# Patient Record
Sex: Female | Born: 1950 | Race: White | Hispanic: No | Marital: Married | Smoking: Former smoker
Health system: Southern US, Community
[De-identification: ages and names within clinical notes are randomized; demographics above are authoritative.]

## PROBLEM LIST (undated history)

## (undated) DIAGNOSIS — N951 Menopausal and female climacteric states: Secondary | ICD-10-CM

## (undated) DIAGNOSIS — I639 Cerebral infarction, unspecified: Secondary | ICD-10-CM

## (undated) DIAGNOSIS — H409 Unspecified glaucoma: Secondary | ICD-10-CM

## (undated) DIAGNOSIS — M797 Fibromyalgia: Secondary | ICD-10-CM

## (undated) DIAGNOSIS — F32A Depression, unspecified: Secondary | ICD-10-CM

## (undated) DIAGNOSIS — F329 Major depressive disorder, single episode, unspecified: Secondary | ICD-10-CM

## (undated) DIAGNOSIS — M1990 Unspecified osteoarthritis, unspecified site: Secondary | ICD-10-CM

## (undated) DIAGNOSIS — E669 Obesity, unspecified: Secondary | ICD-10-CM

## (undated) DIAGNOSIS — E66811 Obesity, class 1: Secondary | ICD-10-CM

## (undated) DIAGNOSIS — F319 Bipolar disorder, unspecified: Secondary | ICD-10-CM

## (undated) DIAGNOSIS — F419 Anxiety disorder, unspecified: Secondary | ICD-10-CM

## (undated) DIAGNOSIS — J189 Pneumonia, unspecified organism: Secondary | ICD-10-CM

## (undated) HISTORY — DX: Major depressive disorder, single episode, unspecified: F32.9

## (undated) HISTORY — DX: Fibromyalgia: M79.7

## (undated) HISTORY — DX: Bipolar disorder, unspecified: F31.9

## (undated) HISTORY — DX: Obesity, unspecified: E66.9

## (undated) HISTORY — DX: Depression, unspecified: F32.A

## (undated) HISTORY — DX: Pneumonia, unspecified organism: J18.9

## (undated) HISTORY — PX: BREAST SURGERY: SHX581

## (undated) HISTORY — PX: APPENDECTOMY: SHX54

## (undated) HISTORY — DX: Menopausal and female climacteric states: N95.1

## (undated) HISTORY — DX: Unspecified osteoarthritis, unspecified site: M19.90

## (undated) HISTORY — DX: Unspecified glaucoma: H40.9

## (undated) HISTORY — DX: Obesity, class 1: E66.811

## (undated) HISTORY — PX: SPINE SURGERY: SHX786

## (undated) HISTORY — DX: Anxiety disorder, unspecified: F41.9

---

## 1999-12-20 ENCOUNTER — Other Ambulatory Visit: Admission: RE | Admit: 1999-12-20 | Discharge: 1999-12-20 | Payer: Self-pay | Admitting: Family Medicine

## 2000-04-28 ENCOUNTER — Encounter: Admission: RE | Admit: 2000-04-28 | Discharge: 2000-04-28 | Payer: Self-pay | Admitting: Family Medicine

## 2000-04-28 ENCOUNTER — Encounter: Payer: Self-pay | Admitting: Family Medicine

## 2003-09-18 ENCOUNTER — Encounter
Admission: RE | Admit: 2003-09-18 | Discharge: 2003-12-17 | Payer: Self-pay | Admitting: Physical Medicine and Rehabilitation

## 2003-11-05 ENCOUNTER — Ambulatory Visit (HOSPITAL_COMMUNITY)
Admission: RE | Admit: 2003-11-05 | Discharge: 2003-11-05 | Payer: Self-pay | Admitting: Physical Medicine and Rehabilitation

## 2003-12-08 ENCOUNTER — Ambulatory Visit (HOSPITAL_COMMUNITY): Admission: RE | Admit: 2003-12-08 | Discharge: 2003-12-08 | Payer: Self-pay | Admitting: Specialist

## 2003-12-08 ENCOUNTER — Ambulatory Visit (HOSPITAL_BASED_OUTPATIENT_CLINIC_OR_DEPARTMENT_OTHER): Admission: RE | Admit: 2003-12-08 | Discharge: 2003-12-08 | Payer: Self-pay | Admitting: Specialist

## 2003-12-08 ENCOUNTER — Encounter (INDEPENDENT_AMBULATORY_CARE_PROVIDER_SITE_OTHER): Payer: Self-pay | Admitting: Specialist

## 2003-12-24 ENCOUNTER — Encounter
Admission: RE | Admit: 2003-12-24 | Discharge: 2004-03-23 | Payer: Self-pay | Admitting: Physical Medicine and Rehabilitation

## 2004-02-10 ENCOUNTER — Other Ambulatory Visit: Admission: RE | Admit: 2004-02-10 | Discharge: 2004-02-10 | Payer: Self-pay | Admitting: Internal Medicine

## 2004-04-06 ENCOUNTER — Encounter
Admission: RE | Admit: 2004-04-06 | Discharge: 2004-06-16 | Payer: Self-pay | Admitting: Physical Medicine and Rehabilitation

## 2004-06-16 ENCOUNTER — Encounter
Admission: RE | Admit: 2004-06-16 | Discharge: 2004-08-13 | Payer: Self-pay | Admitting: Physical Medicine and Rehabilitation

## 2004-06-18 ENCOUNTER — Ambulatory Visit: Payer: Self-pay | Admitting: Physical Medicine and Rehabilitation

## 2004-08-13 ENCOUNTER — Encounter
Admission: RE | Admit: 2004-08-13 | Discharge: 2004-10-27 | Payer: Self-pay | Admitting: Physical Medicine and Rehabilitation

## 2004-09-15 ENCOUNTER — Ambulatory Visit: Payer: Self-pay | Admitting: Physical Medicine and Rehabilitation

## 2004-10-27 ENCOUNTER — Encounter
Admission: RE | Admit: 2004-10-27 | Discharge: 2005-01-25 | Payer: Self-pay | Admitting: Physical Medicine and Rehabilitation

## 2004-10-29 ENCOUNTER — Ambulatory Visit: Payer: Self-pay | Admitting: Physical Medicine and Rehabilitation

## 2004-12-24 ENCOUNTER — Emergency Department: Payer: Self-pay | Admitting: Emergency Medicine

## 2005-01-07 ENCOUNTER — Ambulatory Visit: Payer: Self-pay | Admitting: Physical Medicine and Rehabilitation

## 2005-02-02 ENCOUNTER — Encounter
Admission: RE | Admit: 2005-02-02 | Discharge: 2005-05-03 | Payer: Self-pay | Admitting: Physical Medicine and Rehabilitation

## 2005-02-23 ENCOUNTER — Ambulatory Visit: Payer: Self-pay | Admitting: Physical Medicine and Rehabilitation

## 2005-04-26 ENCOUNTER — Ambulatory Visit: Payer: Self-pay | Admitting: Physical Medicine and Rehabilitation

## 2005-05-26 ENCOUNTER — Encounter
Admission: RE | Admit: 2005-05-26 | Discharge: 2005-08-24 | Payer: Self-pay | Admitting: Physical Medicine and Rehabilitation

## 2005-06-23 ENCOUNTER — Ambulatory Visit: Payer: Self-pay | Admitting: Physical Medicine and Rehabilitation

## 2005-08-18 ENCOUNTER — Ambulatory Visit: Payer: Self-pay | Admitting: Physical Medicine and Rehabilitation

## 2005-09-15 ENCOUNTER — Encounter
Admission: RE | Admit: 2005-09-15 | Discharge: 2005-12-14 | Payer: Self-pay | Admitting: Physical Medicine and Rehabilitation

## 2005-10-12 ENCOUNTER — Ambulatory Visit: Payer: Self-pay | Admitting: Physical Medicine and Rehabilitation

## 2005-12-09 ENCOUNTER — Ambulatory Visit: Payer: Self-pay | Admitting: Physical Medicine and Rehabilitation

## 2006-01-06 ENCOUNTER — Ambulatory Visit: Payer: Self-pay | Admitting: Physical Medicine & Rehabilitation

## 2006-01-06 ENCOUNTER — Encounter
Admission: RE | Admit: 2006-01-06 | Discharge: 2006-04-06 | Payer: Self-pay | Admitting: Physical Medicine and Rehabilitation

## 2006-02-03 ENCOUNTER — Ambulatory Visit: Payer: Self-pay | Admitting: Physical Medicine and Rehabilitation

## 2006-03-02 ENCOUNTER — Ambulatory Visit: Payer: Self-pay | Admitting: Physical Medicine and Rehabilitation

## 2006-03-27 ENCOUNTER — Ambulatory Visit: Payer: Self-pay | Admitting: Physical Medicine and Rehabilitation

## 2006-04-26 ENCOUNTER — Encounter
Admission: RE | Admit: 2006-04-26 | Discharge: 2006-07-25 | Payer: Self-pay | Admitting: Physical Medicine and Rehabilitation

## 2006-05-25 ENCOUNTER — Ambulatory Visit: Payer: Self-pay | Admitting: Physical Medicine and Rehabilitation

## 2006-06-27 ENCOUNTER — Ambulatory Visit: Payer: Self-pay | Admitting: Physical Medicine and Rehabilitation

## 2006-07-25 ENCOUNTER — Encounter
Admission: RE | Admit: 2006-07-25 | Discharge: 2006-10-23 | Payer: Self-pay | Admitting: Physical Medicine and Rehabilitation

## 2006-07-25 ENCOUNTER — Ambulatory Visit: Payer: Self-pay | Admitting: Physical Medicine and Rehabilitation

## 2006-09-20 ENCOUNTER — Ambulatory Visit: Payer: Self-pay | Admitting: Physical Medicine and Rehabilitation

## 2006-10-20 LAB — HM COLONOSCOPY: HM Colonoscopy: NORMAL

## 2006-11-09 ENCOUNTER — Ambulatory Visit: Payer: Self-pay | Admitting: Internal Medicine

## 2006-11-16 ENCOUNTER — Encounter
Admission: RE | Admit: 2006-11-16 | Discharge: 2007-02-14 | Payer: Self-pay | Admitting: Physical Medicine and Rehabilitation

## 2006-11-16 ENCOUNTER — Ambulatory Visit: Payer: Self-pay | Admitting: Physical Medicine and Rehabilitation

## 2006-11-24 ENCOUNTER — Ambulatory Visit: Payer: Self-pay | Admitting: Internal Medicine

## 2006-12-14 ENCOUNTER — Ambulatory Visit: Payer: Self-pay | Admitting: Physical Medicine and Rehabilitation

## 2007-02-06 ENCOUNTER — Ambulatory Visit: Payer: Self-pay | Admitting: Physical Medicine and Rehabilitation

## 2007-03-05 ENCOUNTER — Ambulatory Visit: Payer: Self-pay | Admitting: Gastroenterology

## 2007-03-07 ENCOUNTER — Encounter
Admission: RE | Admit: 2007-03-07 | Discharge: 2007-06-05 | Payer: Self-pay | Admitting: Physical Medicine and Rehabilitation

## 2007-05-02 ENCOUNTER — Ambulatory Visit: Payer: Self-pay | Admitting: Physical Medicine and Rehabilitation

## 2007-06-27 ENCOUNTER — Ambulatory Visit: Payer: Self-pay | Admitting: Physical Medicine and Rehabilitation

## 2007-06-27 ENCOUNTER — Encounter
Admission: RE | Admit: 2007-06-27 | Discharge: 2007-06-27 | Payer: Self-pay | Admitting: Physical Medicine and Rehabilitation

## 2007-08-16 ENCOUNTER — Ambulatory Visit: Payer: Self-pay | Admitting: Physical Medicine and Rehabilitation

## 2007-09-07 ENCOUNTER — Ambulatory Visit: Payer: Self-pay | Admitting: Physical Medicine and Rehabilitation

## 2007-09-07 ENCOUNTER — Encounter
Admission: RE | Admit: 2007-09-07 | Discharge: 2007-12-06 | Payer: Self-pay | Admitting: Physical Medicine and Rehabilitation

## 2007-11-08 ENCOUNTER — Encounter
Admission: RE | Admit: 2007-11-08 | Discharge: 2008-02-06 | Payer: Self-pay | Admitting: Physical Medicine and Rehabilitation

## 2007-11-08 ENCOUNTER — Ambulatory Visit: Payer: Self-pay | Admitting: Physical Medicine and Rehabilitation

## 2007-12-26 ENCOUNTER — Ambulatory Visit: Payer: Self-pay | Admitting: Physical Medicine and Rehabilitation

## 2008-01-24 ENCOUNTER — Encounter
Admission: RE | Admit: 2008-01-24 | Discharge: 2008-04-23 | Payer: Self-pay | Admitting: Physical Medicine and Rehabilitation

## 2008-01-30 ENCOUNTER — Ambulatory Visit: Payer: Self-pay | Admitting: Physical Medicine and Rehabilitation

## 2008-02-28 ENCOUNTER — Ambulatory Visit: Payer: Self-pay | Admitting: Physical Medicine and Rehabilitation

## 2008-03-27 ENCOUNTER — Ambulatory Visit: Payer: Self-pay | Admitting: Physical Medicine and Rehabilitation

## 2008-04-25 ENCOUNTER — Encounter
Admission: RE | Admit: 2008-04-25 | Discharge: 2008-06-27 | Payer: Self-pay | Admitting: Physical Medicine and Rehabilitation

## 2008-04-28 ENCOUNTER — Ambulatory Visit: Payer: Self-pay | Admitting: Physical Medicine and Rehabilitation

## 2008-05-27 ENCOUNTER — Ambulatory Visit: Payer: Self-pay | Admitting: Physical Medicine and Rehabilitation

## 2008-06-27 ENCOUNTER — Ambulatory Visit: Payer: Self-pay | Admitting: Physical Medicine and Rehabilitation

## 2008-07-18 ENCOUNTER — Encounter
Admission: RE | Admit: 2008-07-18 | Discharge: 2008-07-25 | Payer: Self-pay | Admitting: Physical Medicine and Rehabilitation

## 2008-07-25 ENCOUNTER — Ambulatory Visit: Payer: Self-pay | Admitting: Physical Medicine and Rehabilitation

## 2011-01-19 ENCOUNTER — Emergency Department: Payer: Self-pay | Admitting: Emergency Medicine

## 2011-03-01 NOTE — Assessment & Plan Note (Signed)
Catherine Hughes is a 60 year old married female who is being followed in our  pain and rehabilitative clinic predominantly for low back pain.  She is  status post lumbar fusion in 1990s, has intermittent left leg pain.  She  is back in today.   She states her average pain is between at 6-9 on a scale of 10.  The  pain is described as aching, stabbing, sharp, burning in nature,  variably interfering with her general activity.  The pain is typically  worse in the morning and in the evening, worse with activity such as  bending and inactivity.  She improves with rest, heat, therapy,  medications.  She gets better relief with current meds that she is being  treated with.   MEDICATIONS:  Provided by our clinic, include:  1. Flexeril 5 mg, 3 times a day.  2. Lidoderm on a p.r.n. basis.  3. Celebrex 200 mg once a day, not more than 10 per month.  4. Prilosec 20 mg daily.  5. Vicodin 5/500, up to 4 times a day.   She states that she has been having some vaginal bleeding and is  currently being followed up by her gynecologist, is going to undergo an  ultrasound.   Regarding her functional status, she is able to walk between 10-40  minutes at a time.  She is able to climb stairs.  Occasionally she does  have some trouble.  She does not drive.  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  She is independent with her  self care, needs some assistance with higher level household activities.  She admits to depression/anxiety.  She denies suicidal ideation.   PAST MEDICAL HISTORY:  Otherwise unchanged.   SOCIAL HISTORY:  Otherwise unchanged.   FAMILY HISTORY:  Otherwise unchanged.   PHYSICAL EXAMINATION:  VITAL SIGNS:  Blood pressure 130/78, pulse 115,  respirations 18, 98% saturated on room air.  GENERAL:  She is a well developed, mildly obese female who appears her  stated age and does not appear in any distress.  NEUROLOGIC:  She is oriented x3.  Her speech is  clear.  Affect is  bright.  She is alert, cooperative, and pleasant.  She follows commands  without difficulty.  MUSCULOSKELETAL:  Transitioning from sit-to-stand is done with ease.  Gait in the room is normal.  Tandem gait and Romberg test are performed  adequately.  Limitations are noted in lumbar motion in all planes.  Reflexes are symmetric and intact.  Motor strength is in the 5/5 range.  Straight leg raise is negative.   IMPRESSION:  1. Chronic low back pain, status post lumbar fusion in 1995.  2. History of bipolar disorder, currently on Seroquel.  3. Anxiety, currently on Xanax.  The Seroquel and Xanax are provided      by outside physician.  4. I will refill the following medications for her today:      a.     Celebrex 200 mg, one p.o. daily p.r.n. #20, for low back       pain.      b.     Vicodin 5/500, one p.o. q.i.d. p.r.n. back pain, #120.      c.     Robaxin 500 mg, one p.o. daily, #30, one refill.  Not to be       taken with Flexeril.  5. She has found that Flexeril may not be that helpful.  She would  like to trial another muscle relaxant.  We will trial her on      Robaxin.  6. I will see her back in 2 months.  7. Nursing visit next month for refill of her medications.           ______________________________  Brantley Stage, M.D.     DMK/MedQ  D:  09/10/2007 14:27:15  T:  09/10/2007 16:38:09  Job #:  [id number]

## 2011-03-01 NOTE — Assessment & Plan Note (Signed)
Catherine Hughes is a 60 year old married woman who is followed in our Pain  and Rehabilitative Clinic for chronic low back pain.  She is back in  today for a refill of her pain medications.   She states her average pain is about a 3 on a scale of 10 for the most  part.  Currently, it is about a 6.  However, she has been working out in  the yard quite a bit over the last couple of days, and she had family  members over for the [employer].  She does also have some  complaints of some cervicalgia, which is resolving for her.   Average pain moderately interferes with activity.  Sleep is fair.  She  gets fair relief with the current medications that she is on.  Her low  back pain is typically aggravated by bending, walking and sitting.   FUNCTIONAL STATUS:  She is able to walk about 45 minutes at a time.  She  is able to climb stairs.  She is currently not driving.  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  She also is currently  starting to paint again, enjoy her art work as well as gardening, which  is an overall improvement in her general function.  Feeding, dressing, bathing and toileting are done independently.  She  requires occasional assistance with meal prep, household duties and  shopping.   She denies problems controlling bowel or bladder, admits to occasional  depression , confusion, anxiety, but reports overall these things are  much better.  She denies suicidal ideation.   There are no changes in her past medical, social or family history since  our last visit.   MEDICATIONS FROM THIS CLINIC:  1. Flexeril 5 mg up to 3 times a day on a p.r.n. basis.  2. Lidoderm p.r.n.  3. Prilosec daily.  4. Celebrex 200 mg not more than 20 times per month.  5. Vicodin 5/500 up to 4 times a day on a p.r.n. basis for low back      pain.   EXAMINATION:  Blood pressure is 124/66, pulse 90, respiration 18, 96%  saturated on room air.  She is an obese woman who  appears her stated  age, and does not appear in any distress.  She is oriented x3.  Speech  is clear.  Her affect is bright.  She is alert, cooperative and  pleasant.  She follows commands without difficulty.  Transitioning from sitting to standing is done with ease.  Gait in the  room is normal, nonantalgic.  Tandem gain, Romberg's test are all  performed adequately.  She has limitations in lumbar motion in all planes.  Reflexes are symmetric and intact in lower extremities.  Her motor  strength is 5/5 at hip flexors, knee extensors, dorsiflexors, plantar  flexors without focal deficit noted.  Sensory exam is not tested today.  Straight leg raise is negative.  No abnormal tone is noted.  No clonus  is noted.   IMPRESSION:  1. Chronic low back pain status post lumbar fusion 1995.  2. Lumbar spondylosis.  3. History of bipolar disorder currently being managed by Dr.      Annabell Howells, and being treated with multiple psychiatric mediations      including Xanax, Restoril, Neurontin, Seroquel, Lamictal and      recently added was Prozac.   We will refill her hydrocodone today 5/500 one p.o. up to 4 times a day  #  120 per month.  She does not need refills on any of the other  mediations at this time.  We will see her back in two months.  Nursing  visit next month for refill.   [name] has been stable on the above mediations.  She has not  exhibited any aberrant behavior.  She takes her medications as  prescribed, and her functional status has overall been improving over  the last year.  She is now engaging in some avocational activities,  which she is enjoying including painting and gardening.           ______________________________  Brantley Stage, M.D.     DMK/MedQ  D:  01/30/2008 13:35:36  T:  01/30/2008 14:09:41  Job #:  [id number]

## 2011-03-01 NOTE — Assessment & Plan Note (Signed)
Catherine Hughes is a 60 year old married female who was seen last by me on  Mar 08, 2007.  She is back in today for a refill of her medications.  She is being seen in our pain and rehab clinic for chronic low-back  pain.  She is status post a fusion from 47.  She also had some recent  right Achilles tendonitis which was bothering her at the last visit.   She is back in today, reports overall improvement in the right Achilles  tendon, just mildly bothering her at this point.  Average pain in the  low-back is about a 7 to a 10 on the pain scale.   She has been quite active.  She has been working in the yard and doing  some planting, using an shovel and overall has been quite active.   She reports sleep poor at times.  Pain is typically worse with bending  and sitting, certain activities, and even inactivity bothers her.  Improves with heat, ice, physical therapy and medications.  She is  getting good relief with the current medications that she is on.  She  can walk for over a half of an hour.  She is able to climb stairs.  She  does not drive.  She is independent with her self care and needs some  assistance with meal prep, household duties and shopping.  She admits to  occasional depression/anxiety but denies suicidal ideation.   She states that she recently underwent colonoscopy.  The polyp was  removed, which was not malignant.   No other changes in past medical, social or family history since last  visit.   Medications provided by this clinic include:  1. Flexeril 5 mg up to three times a day.  2. Lidoderm on a p.r.n. basis.  3. Fentanyl patch 12 mcg patch every 3 days.  4. Celebrex 200 mg once a day up to 10 tablets per month.  5. Prilosec 20 mg daily.  6. Vicodin 5/500 one p.o. daily to b.i.d. #45 per month.   EXAMINATION TODAY:  Blood pressure is 122/78, pulse 78, respirations 16,  100% saturated on room air.  She is mildly obese white female who  appears her stated age and  does not appear in any distress.  She is  oriented x3.  Her affect is bright, alert, cooperative and pleasant.  Her speech is clear.  She follows commands without any difficulty.   She transitions from sitting to standing without any problems.  Her gait  is within normal limits.  Tandem gait and Romberg's test are performed  and are adequately performed.   She has limitations in lumbar motion in all pains.  Seated reflexes are  symmetric and intact.  Motor strength is good in the lower extremities.  No sensory deficits are appreciated.  Straight leg raise negative.   Achilles tendon was examined in the right and left side.  No tenderness  is noted today.  She appears to have had significant improvement in this  over the last month.   IMPRESSION:  1. Resolution of Achilles tendonitis.  2. Chronic low-back pain status post fusion 1995.  3. History of substance abuse.  4. History of bipolar disorder.  5. History of anxiety/depression.   PLAN:  Refilled the following medications for her:  1. Flexeril 5 mg one p.o. t.i.d. p.r.n. back spasm #90 with three      refills.  2. Fentanyl 12 mcg patch one patch q.72h. hours #10, no  refills.  3. Vicodin 5/500 one p.o. daily b.i.d. p.r.n. back pain, #45.   Will also give her a prescription for physical therapy to provide  massage and/or modalities to decrease her pain and avoid further  narcotic analgesia.  Diagnosis would be previous disc surgery, lumbago,  degenerative disc disorder, and lumbar muscle spasm which is noted on  exam as well.  We will see her back in a month.           ______________________________  Brantley Stage, M.D.     DMK/MedQ  D:  03/08/2007 14:38:29  T:  03/08/2007 15:54:42  Job #:  [id number]

## 2011-03-01 NOTE — Assessment & Plan Note (Signed)
Catherine Hughes is a 60 year old married woman who is being seen in our Pain  and Rehabilitative Clinic __________ for chronic low back pain, status  post effusion back in the 1990s with intermittent left leg pains.   She was out of state visiting her sick mother.   She was walking in the hospital and apparently the floor was wet.  She  states that she came around the corner and slipped on a wet floor and  fell backwards, hit her head.   She states that she underwent diagnostic testing out of state including  x-rays, CT scan of her head which was okay other than some arthritis in  her neck.   Average pain scores are between a 2 and a 5 and on the average between a  5 and a 10.  Pain is located apparently in the posterior cervical region  and low back, radiating down the left leg to about the lateral thigh,  stopping at the knees.   __________ before, she gets very good relief with the current  medications prescribed by this clinic.   Medications from this clinic include:  1. Flexeril 5 mg up to 3 times a day on a p.r.n. basis.  2. Lidoderm p.r.n.  3. Fentanyl 12 mcg patch 1 q. 72 hours.  4. Vicodin 5/500 up to twice a day for a total of not more than 45      tablets per month.   She never climbs stairs.  She is not driving.  She does some computer  work at home.  Is independent with self care.  She denies suicidal  ideations, she denies problem controlling bowel or bladder.   No health and history form. Review of systems is attached to the chart.   No other changes in her past medical, social or family history  otherwise.   EXAM:  Blood pressure is 117/72, pulse 84, respirations 17, 99%  saturation on room air.  She is a well-developed, well-nourished female who appears her stated  age.   She is oriented x3.  Her speech is clear.  Her affect is bright, alert,  cooperative, pleasant.  She does get a little tearful discussing her  mother's illness however.  She follows commands  without difficulty.   Transitioning from sitting to standing is without any problems.  Gait in  the room is normal.  Tandem gait, Romberg's test performed adequately.  She has good range of motion in her cervical spine, including rotation  right, left, forward flexion and extension as well as lateral flexion.  She reports some discomfort in the right upper trapezius area with  rotation to the right.   She has some tenderness to palpation in the bilateral upper trapezius  musculature and pericapsular musculature.   Low back is just mildly limited in forward flexion and extension.  Forward flexion bothers her a little more than extension.  Reflexes are  evaluated in the upper and lower extremities.  They are symmetric and  intact.  The biceps, triceps, brachial radialis, patellar and Achilles'  tendon .  There is no clonus noted, no abnormal tone is noted. Her motor  strength is good in upper and lower extremities without focal weakness.  No sensory abnormalities are appreciated with light touch today.   IMPRESSION:  1. Chronic low back pain status post lumbar fusion 1995.  2. Recent injury to neck and head after slipping and falling.  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   PLAN:  Will refill her medications today to include fentanyl 12 mcg  patch per hour, 1 patch q. 72 hours, #10.  Vicodin 5/500 one p.o. q. day  to b.i.d. p.r.n. back pain, #45.  We will get her involved in a physical  therapy program, modalities to the lumbar area and a lumbar  stabilization program, transitioning to home program.   Ms. Laforte has expressed interest again today in discontinuing her  fentanyl.  She would like to do this within the next couple of months.  During the transition phase, was considering calling in Vicodin weekly  for her.   She so far has been quite stable on  these medications, has exhibited no  aberrant behavior.  She takes her medicines as prescribed and is getting  very good relief with them and is able to maintain a very functional  lifestyle.           ______________________________  Brantley Stage, M.D.     DMK/MedQ  D:  05/03/2007 09:12:26  T:  05/03/2007 15:06:41  Job #:  [id number]

## 2011-03-01 NOTE — Assessment & Plan Note (Signed)
HISTORY OF PRESENT ILLNESS:  [name] is a 60 year old married white  female who is followed in our Pain and Rehabilitative Clinic for a  chronic low back pain.  She was last seen by me on January 30, 2008.  She  has had nursing visit on Feb 28, 2008 and March 27, 2008.   She is back in today and indicates that her average pain is variable  between a 3 and 7 on a scale of 10.  Pain is intermittent, worse with  certain activities, walking, bending, and sitting for a prolonged period  of time, even inactivity bothers her.  However, her low back for the  most part has been doing fairly well and only moderately affecting her  general activity.   She states that she has done active gardening and been doing some  weeding and she got an athletic machine from a friend or relative,  whereby she engages in a motion similar to cross-country skiing.  She  states that over the last couple of months, she has developed some knee  pain, which does not occur everyday, rather occurring up to 10 days or  so a month, lasting maybe 5-10 minutes when it does occur.  She has some  concerns regarding this and what she could do about it.   She denies any trauma to the knees. Both knees bother her, sometimes  more one-sided than the other.  Not any particular site is continually  involved.  She is getting fair relief with current meds prescribed from  this clinic.   MEDICATIONS:  Medications that she does take from this clinic include:  1. Flexeril 5 mg up to 3 times a day p.r.n.  2. Lidoderm p.r.n.  3. Prilosec daily.  4. Celebrex 200 and not more than 20 times per month.  5. Vicodin 5/500 up to 4 times a day p.r.n. for low back pain.   FUNCTIONAL STATUS:  Mobility, she is able to walk 15-20 minutes at a  time, up to 2-3 times a week.  She is able to climb stairs.  She is able  to drive.  She is a high functioning individual, currently engaged in  gardening and independent with all of her self care,  including some  higher level household activities.  She does avoid heavier lifting type  activities at home, however.   REVIEW OF SYSTEMS:  No new changes with respect to review of systems.  Reports occasional bladder control problems with coughing or sneezing,  which is not new.  No new numbness and no new tingling, weakness, or  dizziness.  Occasional confusion, anxiety, and denies depression or  suicidal ideations.   She is currently followed by Dr. Dossie Arbour, who is managing her anxiety  and history of bipolar disorder.   She admits to occasional constipation, which is controlled.   No changes in the past medical, social, or family history since her last  visit 2 months ago.   PHYSICAL EXAMINATION:  VITAL SIGNS:  ON exam, blood pressure is 110/75,  pulse 70, respirations 18, and 97% saturated on room air.  GENERAL:  She is well developed, well nourished, mildly obese female who  does not appear in any distress.   She is oriented x3 and her speech is clear.  Her affect is bright.  She  is alert, cooperative, and pleasant.  She follows commands without  difficulty.   NEURO:  Cranial nerves are grossly intact.  Coordination is grossly  intact.  She  transitions evenly from sitting to standing.  Her gait is  in the room is normal, tandem gait.  Romberg test, heel and toe walking  are also performed adequately.  Reflexes are 1+ at the patellar and  Achilles tendons and there is no abnormal tone or clonus noted.  Sensation is intact in the lower extremities as well.   MUSCULOSKELETAL EXAM:  Reveals some flattening of the lumbar lordosis.  She has the limitations in lumbar flexion.  She is able to touch the  floor however, because she has a very good hip range of motion.   Some tenderness is noted in the lumbar paraspinal musculature.   Examination of the right knee reveals no swelling.  She has full range  of motion.  No ligamentous instability is appreciated and no tenderness   to palpation in any of the soft tissue structures about the right knee  or along the medial joint line.  She states she really does not have any  pain in the right knee at this time.  Left knee again reveals a full  range of motion with flexion as well as extension.  No evidence of  effusion.  She has intact AP and medial and lateral ligaments.  There is  no tenderness at the inferior or superior patella.  Joint line is  essentially nontender.  She does have some tenderness in the left medial  pes anserine region, which is actually quite tender today.   IMPRESSION:  1. New problems, intermittent bilateral knee pain.  Exam consistent      with left pes anserine tendinitis at this time.  Right knee is      essentially nontender.  2. Chronic low back pain, status post lumbar fusion, 1995.  3. Lumbar spondylosis.  4. History of bipolar disorder. It is currently being managed by Dr.      Dossie Arbour and he has her on Prozac, Neurontin, Seroquel, and      Lamictal.   PLAN:  [name] would like to attempt to reduce the amount of Vicodin  she is taking.  We will write her prescription today.  Vicodin 5/500,  1/2 tablet at 1 p.o. q.i.d. p.r.n. back pain #120.  We will have her  setup for a nursing visit next month for a refill of her pain  medications; however, she may not need the visit for refill and I will  see her back in 2 months.  [name] has been taking her medications as  prescribed.  She did forget her pill bottles this morning; however, she  states that she has 1 tablet left today at home.  She will try to  remember her pill bottles and this was discussed by nursing staff as  well as myself today.  Overall, she has not displayed over the several  months any aberrant behavior with her medication use, in fact, she is  requesting an overall reduction in her narcotics.  I will see her back  in 2 months, a tentative nursing visit next month.           ______________________________   Brantley Stage, M.D.     DMK/MedQ  D:  04/28/2008 11:23:03  T:  04/29/2008 07:35:26  Job #:  [id number]

## 2011-03-01 NOTE — Assessment & Plan Note (Signed)
The patient is a 60 year old married female who is being seen in our  pain and rehabilitation clinic for chronic low back pain.  She is back  in today and has complaints of low back pain.  The pain is variable from  2 to 8 on a scale of 10.  In the clinic today it is about 6 to 8 on a  scale of 10.  Pain is described as intermittent, dull, sharp, stabbing,  aching in nature, moderately interfering with general activity, worse  during the daytime and evening.  It does not bother her as much at night  and in the morning.  Worse with bending and activity, standing.  Improves with rest, medications, heat.  She gets fair relief with  current medicines that are prescribed by this clinic.   MEDICATIONS FROM THIS CLINIC:  1. Flexeril 5 mg up to three times a day.  2. Lidoderm p.r.n.  3. Prilosec 20 mg daily.  4. Vicodin 5/500 one p.o. q.i.d. p.r.n. #120 per month.  5. Celebrex 200 mg not more than 20 times per month.   She reports no new medical problems in the interim.  She has been going  through menopause and is on some hormones.  She states that she is  having some lower abdominal cramps today but no bleeding.   She is able to walk about an hour at a time, able to climb steps.  She  is independent with feeding, bathing, toileting, and dressing.  She  requires some assistance with higher level household activities.   She admits to intermittent numbness and tingling in the lower  extremities.  Admits to depression, confusion, and anxiety.  Denies  suicidal ideation.   REVIEW OF SYSTEMS:  Otherwise noncontributory.   PAST MEDICAL HISTORY:   SOCIAL HISTORY:   FAMILY HISTORY:  Unchanged from last month.  She was seen last here on  November 09, 2007.   PHYSICAL EXAMINATION:  VITAL SIGNS:  Blood pressure 111/82, pulse 92,  respirations 18, 98% saturated on room air.  GENERAL:  She is mildly obese white female who does not appear in any  distress.  She is oriented x3.  Her speech is  clear and affect is  bright.  She is alert, cooperative, and pleasant.  She follows commands  without any difficulty.  NEUROLOGY:  Transitioning from sitting to standing is done with ease.  Gait in the room is normal.  Tandem gait, Romberg test, heel-toe walking  are all performed adequately.  Limitations are noted in lumbar extension  not more than about 5-10 degrees of lumbar extension are noted today.  Forward flexion is quite good, approximately 80 degrees, but does note  some increased back pain upon extension back up.  Mild tenderness in the  paraspinal muscles are noted.  Reflexes are symmetric in the lower  extremities.  No abnormal tone is noted.  No clonus is noted.  Motor  strength is 5/5 at hip flexors, knee extensors, dorsiflexors, plantar  flexors, EHL.  Straight leg raise is negative.  No focal sensory  deficits are appreciated.   IMPRESSION:  1. Chronic low back pain status post lumbar fusion in 1995.  2. Lumbar spondylosis.  3. History of bipolar disorder, currently being treated with multiple      psychiatric medications including Xanax, Restoril, Neurontin,      Seroquel, Lamictal prescribed by Dr. Annabell Howells.   PLAN:  I will refill her Celebrex 200 mg not more than 10 per month with  three refills, Flexeril 5 mg one p.o. t.i.d. #90 with three refills,  Prilosec 20 mg one p.o. daily #30 with three refills, Vicodin 5/500 one  p.o. q.i.d. p.r.n. back pain #120 with no refills.  We will have the  nursing staff see her back in a month for refill of her medications.  We  will just need to refill the Vicodin for her at that time.  I will see  her back in two months.           ______________________________  Brantley Stage, M.D.     DMK/MedQ  D:  12/07/2007 14:23:08  T:  12/08/2007 11:30:46  Job #:  [id number]

## 2011-03-01 NOTE — Assessment & Plan Note (Signed)
HISTORY:  The patient is a 61 year old married female who is being seen  in our pain and rehabilitative clinic for chronic low back pain.  She is  status post a lumbar fusion in the 1990's with intermittent left leg  pain.   She is back in today and is requesting refill of her medications.  She  states she has been off of her fentanyl patch now for 4 days.  She put  one patch on Saturday, took it off Sunday after the second day and she  has not used any fentanyl patches now for 4 days.  She states she is  doing fairly well, using the Vicodin 5/500 in place up to four times a  day as well as Lidoderm.  Her pain has slightly worsened; however, she  believes she is tolerating it well and is able to maintain her function.  She has obtained a new psychiatrist over the last couple of months, Dr.  Annabell Howells, and has been started on Seroquel.  She states that she feels  she is doing much better with this medication.   Pain is typically worsened with inactivity and not pacing her  activities.  Improves with heat, ice, medication.  She is getting fair  relief currently with the medications provided by this clinic.   MEDICATIONS FROM THIS CLINIC:  1. Flexeril 5 mg one p.o. t.i.d.  2. Lidoderm 5% on a p.r.n. basis.  3. Celebrex 200 mg p.r.n.  4. Prilosec 20 mg q.d.  5. Vicodin 5/500 one p.o. q.i.d. p.r.n. back pain.   She is a high functioning woman who is independent with all of her self-  care and helps out around the house as well as the yard.  She denies  suicidal ideation.  Denies problems controlling bowel or bladder.  Does  admit to some anxiety.  States she has been recently diagnosed again  with bipolar disorder.   No other changes in her review of systems.  Past medical, social and  family history otherwise unchanged since last visit.   PHYSICAL EXAMINATION:  Blood pressure 116/73.  Pulse 94.  Respirations  20.  99% saturate on room air.  She is mildly obese white female who  does  not appear in any distress.  She is oriented x 3.  Her speech is  clear.  Affect is bright, alert, cooperative, and pleasant, follows  commands without difficulty.  She transitions from sitting to standing  easily.  Gait in the room is nonantalgic.  Normal gait pattern  displayed.  She reports pain with forward flexion and extension, has  limitations in these movements.  Seated her reflexes are symmetric and  intact at patellar and Achilles tendons.  Motor strength is good in the  lower extremities at hip flexors, knee extensors, dorsiflexors, plantar  flexors, EHL.  No clonus is noted.  No abnormal tone is noted.   IMPRESSION:  1. Chronic low back pain status post lumbar fusion in 1995.  2. Lumbago.  3. Bipolar disorder, currently on Seroquel.   PLAN:  We will refill the following medications for her today.  1. Lidoderm 5% up to three patches 12 hours on, 12 hours off #60 no      refills.  2. Vicodin 5/500 one p.o. q.i.d. p.r.n. back pain #120, no refills.  3. Celebrex 200 mg one p.o. q.d. #30, no refills.  Anticipate will be      decreasing Celebrex over the next month or two.   We will see  her back in one month.  Goals at this point are to  ultimately decrease Celebrex use as well and minimize Vicodin use,  possibly switching over to Ultracet or Ultram, and encourage positive  coping mechanisms and maintain current function.  I will see her back in  a month.           ______________________________  Brantley Stage, M.D.     DMK/MedQ  D:  06/28/2007 13:15:15  T:  06/29/2007 09:53:56  Job #:  [id number]

## 2011-03-01 NOTE — Assessment & Plan Note (Signed)
Catherine Hughes is a 60 year old married woman who is followed in our Pain  and Rehabilitative Clinic for chronic low back pain and recently has had  some complaints of bilateral knee pain.   She has just returned back from the beach last night and states she is  tired today from the driving, being rather active over the last few  days.   She states she has been going up and down stairs and walking on uneven  sandy surfaces, which may have exacerbated her pain a bit.   She indicates her pain is between 6 and 8 on a scale of 10, described as  constant and aching predominantly in the lumbar region, minimally so in  the bilateral knees.   In fact, she states the knees are really not too much of a problem for  at this time, biggest problem is her low back.  Activity is moderately  affected by her pain at this time.  Pain is worse in the morning and  towards the end of the day.  It is worse with walking, bending,  prolonged sitting or in activity, and prolonged standing.   Pain improves with rest and medication.  She reports good relief with  current meds.   FUNCTIONAL STATUS:  Catherine Hughes can walk at least 30 minutes.  She is  able to climb stairs.  She does not drive.  She needs assistance with  bathing, meal prep, household duties, and shopping.  She is independent  with feeding, dressing, and toileting.   REVIEW OF SYSTEMS:  Positive for constipation, numbness, trouble  walking, occasional confusion, and anxiety.  Denies depression or  suicidal ideation.  Denies oversedation.   Also, reports some nausea and reflux.   No other changes in past medical, social, or family history since last  visit.  She states overall she has been healthy.  In the interim, she  was last seen by me on April 28, 2008, with a nursing visit for refill of  medications in the interim.   PHYSICAL EXAMINATION:  VITAL SIGNS:  Blood pressure is 99/76, pulse 97,  respiration 18, and 95% saturated on room air.  GENERAL:  She is mildly obese female who appears somewhat younger than  her stated age.  She is oriented x3.  Speech is clear.  Affect is  bright.  She is alert, cooperative, pleasant, and she follows commands  without any difficulty.   Her cranial nerves are grossly intact.  Coordination is intact.  Reflexes are diminished in the lower extremities.  Sensation is intact.  She has 5/5 strength at hip flexors; knee extensors, dorsiflexors,  plantar flexors, EHL are all in the 5/5 range.   She has limitations in lumbar motion in all planes about 30 degrees of  forward flexion, very minimal lumbar extension that may be not more than  5 degrees.  Lateral flexion is about 5 degrees bilaterally.   IMPRESSION:  1. Chronic low back pain, status post lumbar fusion in 1995 with      limitations in lumbar motion and chronic pain.  2. Lumbar spondylosis.  3. Occasional bilateral knee pain, currently not bothering her that      much.  At this time, she is not interested in any kind of      intervention.  4. History of bipolar disorder, continues to be treated by Dr.      Dossie Arbour and she is on Prozac, Neurontin, Seroquel, and Lamictal.   We will refill her Vicodin.  She has been on 5/500 q.i.d.  She had  planned to try to decrease the amount of Vicodin she is taking; however,  she is still taking about 4 a day.  We will switch her to a 5/325 to  decrease her overall amount of acetaminophen per day that she is taking.   She has no problems with oversedation from the medication, occasional  constipation is noted, which she takes stool softener for.  She has been  stable on this medication.  She also takes Flexeril, Lidoderm, Prilosec,  and rarely takes the Celebrex during the month to help manage her pain.  Her pill counts are appropriate.  She does not exhibit any aberrant  behavior and despite severe disability per the Oswestry disability  questionnaire of 52%.  She is able to maintain somewhat  of an active  lifestyle.  We will have her see nursing staff in the interim in 2  months for refill of her medications.  We have discussed getting her  some physical therapy for her knees.  At this point, she states the  knees are not bothering her that much and she will consider if they get  worse.           ______________________________  Brantley Stage, M.D.     DMK/MedQ  D:  06/27/2008 08:53:11  T:  06/27/2008 23:00:01  Job #:  [id number]

## 2011-03-01 NOTE — Assessment & Plan Note (Signed)
Catherine Hughes is a 60 year old married female who is followed in our pain  and rehabilitative clinic for low back pain.  She is back in today and  reports that her average back pain is between 4/10 and 8/10.  The pain  varies depending on activities.  Walking, bending, sitting, standing  provoke her back pain.  Rest, heat, and therapy, as well as medications  alleviate it.   She gets fair relief with the current meds prescribed by this clinic for  her pain.   MEDICATIONS AT PRESENT:  1. Flexeril 5 mg 3 times a day on a p.r.n. basis.  2. Lidoderm p.r.n.  3. Celebrex 200 mg 1 p.o. daily, not more than 10 tablets per month.  4. Prilosec on a p.r.n. basis.  5. Vicodin 5/500 up to 4 times a day p.r.n. for low back pain.   In the interim, she reports no new medical problems.  She was last seen  back September 10, 2007.  She has had a nursing visit in December.  She  states there have been no new medical problems for her.  She has had 2  new medicines added to her regimen, but she does not recall their names  and she will bring them in at the next visit so we can update our chart.   She is able to walk at least an hour.  She has some difficulty with  stairs.  Does not drive.  She is independent with her self care.  She  admits to depression and anxiety.  Denies suicidal ideation.  Denies  problems controlling bowel or bladder.   REVIEW OF SYSTEMS:  Noncontributory and attached to the chart.   PAST MEDICAL/SOCIAL/FAMILY HISTORY:  Otherwise, unchanged from her  previous visit in November.   She admits to some rare alcohol use.  Denies smoking.   EXAM:  Blood pressure 105/71, pulse 98, respirations 16, 96% saturation  on room air.  She is an obese white female who does not appear in any distress.  She  is oriented x3 and her speech is clear.  Her affect is bright.  She is  alert, cooperative and pleasant, and she follows commands easily.   She transitions from sitting to standing easily.   Gait in the room is  normal.  Tandem gait and Romberg test are performed adequately.  She has  limitations in lumbar motion about 30 degrees at forward flexion,  essentially no extension.  Tenderness in the lumbar paraspinal  musculature.  No tenderness over the spinous processes noted.   Reflexes are symmetric and diminished in both lower extremities.  No  abnormal tone is noted.  No clonus is noted.  Motor strength is  excellent, 5/5 without focal weakness.  Straight leg raise is negative.   IMPRESSION:  1. Chronic low back pain status post lumbar fusion back in 1995.  2. History of bipolar disorder, currently being treated with Lamictal,      Seroquel, and Neurontin.  3. Anxiety.  She had been on Xanax in the past as well.   I will go ahead and fill her pain medications today, which will include  Vicodin 5/500 one p.o. q.i.d. p.r.n. back pain #120.  Flexeril 5 mg 1  p.o. t.i.d. p.r.n.   She has been stable on these medications.  She has been taking them as  directed.  We will see her back in a month.  No aberrant behavior has  been noted with her.  ______________________________  Brantley Stage, M.D.     DMK/MedQ  D:  11/09/2007 10:19:08  T:  11/09/2007 11:43:42  Job #:  [id number]

## 2011-03-04 NOTE — Group Therapy Note (Signed)
REASON FOR CONSULTATION:  Chronic back pain status post fusion in 1998.   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  She reports she believes she would like to get  into a drug rehab program to help gain control over her medication use.  She  reports that she believes she is bipolar and she would like to have help  managing this as well.  She reports she often self-medicates  inappropriately.   She reports she has a long history of low back and left leg pain.  She also  reports that she has a new recent symptom over the last couple of years,  intermittent swelling in her hand and she is worried she has some sort of  arthritis.  She reports that her maternal grandmother had rheumatoid  arthritis, she is concerned about this.  The low back pain has been  gradually worsening for her, she has flare ups.  Her average pain is about a  six on a scale of ten, she can go up to a seven or an eight and she is as  good as a four on a scale of ten.  She feels that pain interferes  significantly with her mood, ability to do housework, and relate to people,  sleep, and enjoy life depending on if she is in a flare up and if she has  medication available.  Her activity level is generally interfered with as  well.  She reports that pain treatments have given her about 90% pain relief  over the last 24 hours.  She describes her back pain as deep and aching, and  it goes down into the left leg.  It flares up with activity such as weeding  the garden or sitting for a prolonged period of time, and intercourse.  She  denies any new problems with numbness, tingling, weakness, bowel or bladder  problems.  She denies any new gait problems.  Alleviating factors include  ice, heat, Aspercreme, and medications.  She has seen multiple doctors over  the years.  She has tried holistic  medication, over-the-counter medications,  chiropractors, massage, acupuncture.  She has been treated with Cataflam,  Toradol, a multitude of antiinflammatory medications, Naprelan, Flexeril,  Lortab, Soma, Vicodin, Percocet, Xanax, Effexor, Valium, Restoril,  Wellbutrin.  Her last spine study she believes was in about 1998.   FUNCTIONAL HISTORY:  The patient is independent with all of her self-care.  She is able to walk at least 30 minutes.  Sleep is intermittently disrupted.  She feels she does need help with control of her mood and coping skills as  well as self-management.   ALLERGIES:  She reports no specific allergies but does report that with  Percocet if she is on it for a prolong period of time she reports that she  gets thrush.   MEDICATIONS AT THIS TIME:  1. Restoril daily.  2. Xanax 1 mg p.o. q.8h p.r.n.  3. Soma 350 mg t.i.d. (she does report taking it up to five or six times a     day rather than t.i.d.).  4. Oxycodone 5 mg/350 1 p.o. q.4h.   PAST MEDICAL HISTORY:  1. Remarkable for anxiety.  2. History of hepatitis C.   PAST SURGICAL HISTORY:  Remarkable for a report appendectomy; breast  augmentation; spinal fusion in 1998.   REVIEW  OF SYSTEMS:  Was reviewed and is not contributory to this particular  problem.   SOCIAL HISTORY:  The patient lives with her husband and has five children  who live with her intermittently.  Her youngest is 9 and her oldest is 75.  She occasionally drinks alcohol.  She occasionally drinks caffeine.  She  smokes three cigarettes up to a half-pack per day for the last month but she  does have about a 40 year history of smoking off and on.   PHYSICAL EXAMINATION:  VITAL SIGNS:  Pulse 78, blood pressure 113/79,  temperature afebrile.  GENERAL:  Her affect was bright, she was talkative and seemed in good  spirits.  She had a well-healed scar over her abdomen as well as over her  right iliac crest.   She was able to get off the exam  table easily.  Her gait was normal.  She  had normal stride length and no antalgesia was noted.  She was able to rise  up on her toes and heels without difficulty.  She did report some discomfort  in her feet when she did that however.  Seated on the exam table her  reflexes were checked and were noted to be  2+ throughout.  Toes were down going.  No clonus was noted.  Vibratory and  position sense was intact.  Sensory exam was intact to pin prick in L2  through S1 dermatomes.  Motor strength was 5/5 at hip flexors, knee  extensors, dorsiflexors, EHL and plantar flexors.  She had quite a bit of  tenderness to palpation over her left trochanteric bursa area, minimal  tenderness over the right.  She had difficulty even lying down on the left  trochanter area.   IMPRESSION:  1. Chronic low back pain status post fusion.  2. Poor coping and self-management skills.  3. Possible history of bipolar disorder/addiction issues.   PLAN:  1. Will have patient follow up with Dr. Jasmine Awe, may have her considered for     drug rehabilitation program, evaluation and management of bipolar     disorder.  2. History of intermittently swollen hands.  On exam no obvious redness or     synovial thickening was noted today and she had normal joint motion and     strength in the hands. However, with family history and per history of     recent intermittent swelling will obtain a rheumatoid factor, ANA as well     as a sedimentation rate.  3. Left trochanteric bursitis.  4. Will consider injection at the next visit.  At that time would have her     get involved in a physical therapy program for lower extremity stretching     as well as strengthening.  5. At the next visit will also consider having her get involved in physical     therapy for education on proper body mechanics and pacing activity as    well as the spine stabilization program and some endurance home program     as well.     Brantley Stage, M.D.   DMK/MedQ  D:  09/19/2003 16:53:37  T:  09/20/2003 06:49:11  Job #:  [id number]   cc:   DR. Jasmine Awe

## 2011-03-04 NOTE — Op Note (Signed)
NAME:  Catherine Hughes, Catherine Hughes                        ACCOUNT NO.:  [id number]   MEDICAL RECORD NO.:  [id number]                   PATIENT TYPE:  AMB   LOCATION:  [facility]                                  FACILITY:  [facility]   PHYSICIAN:  Earvin Hansen L. Shon Hough, M.D.           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  The patient had increased  pain and discomfort.  Takes medication for the areas, to no avail.  She has  4+ Baker's contractures.   PROCEDURE:  Bilateral capsulectomies with implant removals.   ANESTHESIA:  General.   SURGEON:  Gerald L. Shon Hough, M.D.   DESCRIPTION OF PROCEDURE:  Preoperatively the patient was set up and drawn  for the excision areas.  She then underwent general anesthesia and was  intubated orally.  Prep was done to the chest and breast areas in a routine  fashion using a Betadine soap and solution and walled off with sterile  towels and drapes, so as to make a sterile field.  Xylocaine 0.25% with  epinephrine in a 1:400,000 concentration was injected locally.  An incision  was made in the periareolar area and carried down through the skin and the  subcutaneous tissue, and dissection was then carried down to the capsule.  The capsule was very firm.  It did have some ruptured areas in several  areas.  Using delicate dissection and light retraction, I was able to remove  he capsule with this implant within on both sides.  Hemostasis was  maintained with the Bovie unit on coagulation.  After irrigation was done,  and the wounds were reclosed in layers with #2-0 Monocryl x2 layers, and a  running subcuticular stitch of #3-0 Monocryl.  Steri-Strips and soft  dressings were applied to all areas.  She withstood the procedures very well.  The estimated blood loss was  150  mL.  No complications.                                               Yaakov Guthrie. Shon Hough, M.D.    [name]  D:  12/08/2003  T:  12/08/2003  Job:  [id number]

## 2011-03-23 ENCOUNTER — Ambulatory Visit: Payer: Self-pay | Admitting: Family Medicine

## 2011-03-24 ENCOUNTER — Ambulatory Visit: Payer: Self-pay | Admitting: Pain Medicine

## 2012-01-16 ENCOUNTER — Inpatient Hospital Stay: Payer: Self-pay | Admitting: *Deleted

## 2012-01-16 LAB — CBC
HCT: 36 % (ref 35.0–47.0)
HGB: 11.9 g/dL — ABNORMAL LOW (ref 12.0–16.0)
MCH: 30.4 pg (ref 26.0–34.0)
MCHC: 33.1 g/dL (ref 32.0–36.0)
MCV: 92 fL (ref 80–100)
RDW: 13.8 % (ref 11.5–14.5)

## 2012-01-16 LAB — COMPREHENSIVE METABOLIC PANEL
Albumin: 3.6 g/dL (ref 3.4–5.0)
Alkaline Phosphatase: 107 U/L (ref 50–136)
Anion Gap: 9 (ref 7–16)
BUN: 11 mg/dL (ref 7–18)
Calcium, Total: 8.7 mg/dL (ref 8.5–10.1)
Co2: 27 mmol/L (ref 21–32)
Creatinine: 0.98 mg/dL (ref 0.60–1.30)
EGFR (African American): >60
EGFR (Non-African Amer.): >60
Osmolality: 284 (ref 275–301)
Total Protein: 7.1 g/dL (ref 6.4–8.2)

## 2012-01-16 LAB — URINALYSIS, COMPLETE
Blood: NEGATIVE
Ph: 5 (ref 4.5–8.0)
Protein: NEGATIVE
RBC,UR: NONE SEEN /HPF (ref 0–5)
Squamous Epithelial: 2
WBC UR: 1 /HPF (ref 0–5)

## 2012-01-16 LAB — DRUG SCREEN, URINE
Barbiturates, Ur Screen: NEGATIVE (ref ?–200)
Benzodiazepine, Ur Scrn: POSITIVE (ref ?–200)
Cannabinoid 50 Ng, Ur ~~LOC~~: NEGATIVE (ref ?–50)
Cocaine Metabolite,Ur ~~LOC~~: NEGATIVE (ref ?–300)
MDMA (Ecstasy)Ur Screen: NEGATIVE (ref ?–500)
Methadone, Ur Screen: NEGATIVE (ref ?–300)
Opiate, Ur Screen: POSITIVE (ref ?–300)
Tricyclic, Ur Screen: NEGATIVE (ref ?–1000)

## 2012-01-17 LAB — BASIC METABOLIC PANEL
Anion Gap: 9 (ref 7–16)
Calcium, Total: 8.3 mg/dL — ABNORMAL LOW (ref 8.5–10.1)
Co2: 25 mmol/L (ref 21–32)
Creatinine: 0.88 mg/dL (ref 0.60–1.30)
EGFR (African American): >60
EGFR (Non-African Amer.): >60
Glucose: 100 mg/dL — ABNORMAL HIGH (ref 65–99)
Sodium: 144 mmol/L (ref 136–145)

## 2012-01-17 LAB — CBC WITH DIFFERENTIAL/PLATELET
Eosinophil #: 0.2 10*3/uL (ref 0.0–0.7)
Eosinophil %: 1.1 %
HCT: 32 % — ABNORMAL LOW (ref 35.0–47.0)
HGB: 10.6 g/dL — ABNORMAL LOW (ref 12.0–16.0)
Lymphocyte #: 2.3 10*3/uL (ref 1.0–3.6)
Lymphocyte %: 15.6 %
MCV: 93 fL (ref 80–100)
Monocyte #: 0.5 10*3/uL (ref 0.0–0.7)
Monocyte %: 3.7 %
Neutrophil %: 79.5 %
RBC: 3.46 10*6/uL — ABNORMAL LOW (ref 3.80–5.20)

## 2012-01-17 LAB — CK TOTAL AND CKMB (NOT AT ARMC)
CK, Total: 159 U/L (ref 21–215)
CK, Total: 245 U/L — ABNORMAL HIGH (ref 21–215)
CK, Total: 498 U/L — ABNORMAL HIGH (ref 21–215)
CK-MB: 2.1 ng/mL (ref 0.5–3.6)
CK-MB: 5.4 ng/mL — ABNORMAL HIGH (ref 0.5–3.6)

## 2012-01-17 LAB — TROPONIN I: Troponin-I: 0.57 ng/mL — ABNORMAL HIGH

## 2012-01-18 LAB — COMPREHENSIVE METABOLIC PANEL
Albumin: 3 g/dL — ABNORMAL LOW (ref 3.4–5.0)
Alkaline Phosphatase: 91 U/L (ref 50–136)
Anion Gap: 9 (ref 7–16)
BUN: 6 mg/dL — ABNORMAL LOW (ref 7–18)
Chloride: 109 mmol/L — ABNORMAL HIGH (ref 98–107)
Co2: 24 mmol/L (ref 21–32)
EGFR (African American): >60
EGFR (Non-African Amer.): >60
Potassium: 3.6 mmol/L (ref 3.5–5.1)
SGOT(AST): 26 U/L (ref 15–37)
Total Protein: 6.4 g/dL (ref 6.4–8.2)

## 2012-01-18 LAB — CBC WITH DIFFERENTIAL/PLATELET
Basophil #: 0 10*3/uL (ref 0.0–0.1)
Basophil %: 0.3 %
Eosinophil %: 2.9 %
HCT: 31.6 % — ABNORMAL LOW (ref 35.0–47.0)
Lymphocyte #: 1.9 10*3/uL (ref 1.0–3.6)
Lymphocyte %: 19.4 %
MCH: 30.3 pg (ref 26.0–34.0)
MCHC: 32.8 g/dL (ref 32.0–36.0)
MCV: 92 fL (ref 80–100)
Neutrophil #: 6.9 10*3/uL — ABNORMAL HIGH (ref 1.4–6.5)
Neutrophil %: 71 %
RDW: 14.2 % (ref 11.5–14.5)
WBC: 9.7 10*3/uL (ref 3.6–11.0)

## 2012-01-22 LAB — CULTURE, BLOOD (SINGLE)

## 2012-10-17 HISTORY — PX: FRACTURE SURGERY: SHX138

## 2013-02-05 ENCOUNTER — Ambulatory Visit: Payer: Self-pay | Admitting: Family Medicine

## 2013-08-07 ENCOUNTER — Ambulatory Visit: Payer: Self-pay | Admitting: Orthopedic Surgery

## 2014-11-04 ENCOUNTER — Ambulatory Visit: Payer: Self-pay | Admitting: Podiatry

## 2014-11-13 ENCOUNTER — Encounter: Payer: Self-pay | Admitting: Podiatry

## 2014-11-13 ENCOUNTER — Ambulatory Visit (INDEPENDENT_AMBULATORY_CARE_PROVIDER_SITE_OTHER): Payer: Medicare Other

## 2014-11-13 ENCOUNTER — Ambulatory Visit (INDEPENDENT_AMBULATORY_CARE_PROVIDER_SITE_OTHER): Payer: Medicare Other | Admitting: Podiatry

## 2014-11-13 VITALS — BP 116/66 | HR 63 | Resp 16 | Ht 65.0 in | Wt 193.0 lb

## 2014-11-13 DIAGNOSIS — M21611 Bunion of right foot: Secondary | ICD-10-CM

## 2014-11-13 DIAGNOSIS — M2011 Hallux valgus (acquired), right foot: Secondary | ICD-10-CM

## 2014-11-13 DIAGNOSIS — M779 Enthesopathy, unspecified: Secondary | ICD-10-CM | POA: Diagnosis not present

## 2014-11-13 NOTE — Patient Instructions (Addendum)
Bunionectomy A bunionectomy is surgery to remove a bunion. A bunion is an enlargement of the joint at the base of the big toe. It is made up of bone and soft tissue on the inside part of the joint. Over time, a painful lump appears on the inside of the joint. The big toe begins to point inward toward the second toe. New bone growth can occur and a bone spur may form. The pain eventually causes difficulty walking. A bunion usually results from inflammation caused by the irritation of poorly fitting shoes. It often begins later in life. A bunionectomy is performed when nonsurgical treatment no longer works. When surgery is needed, the extent of the procedure will depend on the degree of deformity of the foot. Your surgeon will discuss with you the different procedures and what will work best for you depending on your age and health. LET YOUR CAREGIVER KNOW ABOUT:   Previous problems with anesthetics or medicines used to numb the skin.  Allergies to dyes, iodine, foods, and/or latex.  Medicines taken including herbs, eye drops, prescription medicines (especially medicines used to "thin the blood"), aspirin and other over-the-counter medicines, and steroids (by mouth or as a cream).  History of bleeding or blood problems.  Possibility of pregnancy, if this applies.  History of blood clots in your legs and/or lungs .  Previous surgery.  Other important health problems. RISKS AND COMPLICATIONS   Infection.  Pain.  Nerve damage.  Possibility that the bunion will recur. BEFORE THE PROCEDURE  You should be present 60 minutes prior to your procedure or as directed.  PROCEDURE  Surgery is often done so that you can go home the same day (outpatient). It may be done in a hospital or in an outpatient surgical center. An anesthetic will be used to help you sleep during the procedure. Sometimes, a spinal anesthetic is used to make you numb below the waist. A cut (incision) is made over the swollen  area at the first joint of the big toe. The enlarged lump will be removed. If there is a need to reposition the bones of the big toe, this may require more than 1 incision. The bone itself may need to be cut. Screws and wires may be used in the repair. These can be removed at a later date. In severe cases, the entire joint may need to be removed and a joint replacement inserted. When done, the incision is closed with stitches (sutures). Skin adhesive strips may be added for reinforcement. They help hold the incision closed.  AFTER THE PROCEDURE  Compression bandages (dressings) are then wrapped around the wound. This helps to keep the foot in alignment and reduce swelling. Your foot will be monitored for bleeding and swelling. You will need to stay for a few hours in the recovery area before being discharged. This allows time for the anesthesia to wear off. You will be discharged home when you are awake, stable, and doing well. HOME CARE INSTRUCTIONS   You can expect to return to normal activities within 6 to 8 weeks after surgery. The foot is at increased risk for swelling for several months. When you can expect to bear weight on the operated foot will depend on the extent of your surgery. The milder the deformity, the less tissue is removed and the sooner the return to normal activity level. During the recovery period, a special shoe, boot, or cast may be worn to accommodate the surgical bandage and to help provide stability   to the foot.  Once you are home, an ice pack applied to the operative site may help with discomfort and keep swelling down. Stop using the ice if it causes discomfort.  Keep your feet raised (elevated) when possible to lessen swelling.  If you have an elastic bandage on your foot and you have numbness, tingling, or your foot becomes cold and blue, adjust the bandage to make it comfortable.  Change dressings as directed.  Keep the wound dry and clean. The wound may be washed  gently with soap and water. Gently blot dry without rubbing. Do not take baths or use swimming pools or hot tubs for 10 days, or as instructed by your caregiver.  Only take over-the-counter or prescription medicines for pain, discomfort, or fever as directed by your caregiver.  You may continue a normal diet as directed.  For activity, use crutches with no weight bearing or your orthopedic shoe as directed. Continue to use crutches or a cane as directed until you can stand without causing pain. SEEK MEDICAL CARE IF:   You have redness, swelling, bruising, or increasing pain in the wound.  There is pus coming from the wound.  You have drainage from a wound lasting longer than 1 day.  You have an oral temperature above 102 F (38.9 C).  You notice a bad smell coming from the wound or dressing.  The wound breaks open after sutures have been removed.  You develop dizzy episodes or fainting while standing.  You have persistent nausea or vomiting.  Your toes become cold.  Pain is not relieved with medicines. SEEK IMMEDIATE MEDICAL CARE IF:   You develop a rash.  You have difficulty breathing.  You develop any reaction or side effects to medicines given.  Your toes are numb or blue, or you have severe pain. MAKE SURE YOU:   Understand these instructions.  Will watch your condition.  Will get help right away if you are not doing well or get worse. Document Released: 09/16/2005 Document Revised: 12/26/2011 Document Reviewed: 10/22/2007 [facility] Patient Information 2015 [place], [place]. This information is not intended to replace advice given to you by your health care provider. Make sure you discuss any questions you have with your health care provider. Bunion You have a bunion deformity of the feet. This is more common in women. It tends to be an inherited problem. Symptoms can include pain, swelling, and deformity around the great toe. Numbness and tingling may also be  present. Your symptoms are often worsened by wearing shoes that cause pressure on the bunion. Changing the type of shoes you wear helps reduce symptoms. A wide shoe decreases pressure on the bunion. An arch support may be used if you have flat feet. Avoid shoes with heels higher than two inches. This puts more pressure on the bunion. X-rays may be helpful in evaluating the severity of the problem. Other foot problems often seen with bunions include corns, calluses, and hammer toes. If the deformity or pain is severe, surgical treatment may be necessary. Keep off your painful foot as much as possible until the pain is relieved. Call your caregiver if your symptoms are worse.  SEEK IMMEDIATE MEDICAL CARE IF:  You have increased redness, pain, swelling, or other symptoms of infection. Document Released: 10/03/2005 Document Revised: 12/26/2011 Document Reviewed: 04/02/2007 [facility] Patient Information 2015 [place], [place]. This information is not intended to replace advice given to you by your health care provider. Make sure you discuss any questions you have  with your health care provider.

## 2014-11-13 NOTE — Progress Notes (Signed)
   Subjective:    Patient ID: Catherine Hughes, female    DOB: 08/25/1951, 64 y.o.   MRN: [id number]  HPI Comments: 64 year old female presents the office today with complaints of pain along the inside aspect of her right foot. She states that she has had pain for greater than one year. She states that she believes she is developing a bunion. She states that she has pain with certain shoe gear, ambulation. She received broke her foot approximately 2 years ago. She said no prior treatments. She states that she is on chronic opioid medication for back pain. She would like to come off the pain medication and she is inquiring about possible surgical intervention prior to coming off medication. No other complaints at this time.  Foot Pain      Review of Systems  Constitutional: Positive for unexpected weight change.  Endocrine: Positive for cold intolerance and heat intolerance.  Musculoskeletal: Positive for back pain.       Joint pain Difficulty walking Muscle pain   Psychiatric/Behavioral: The patient is nervous/anxious.   All other systems reviewed and are negative.      Objective:   Physical Exam AAO 3, NAD DP/PT pulses palpable bilaterally, CRT less than 3 seconds Protective sensation intact with Simms Weinstein monofilament, vibratory sensation intact, Achilles tendon reflex intact. 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 There is tenderness along the medial aspect of the first metatarsal head. There is no overlying erythema, edema. There is no pain with first MTPJ range of motion there is no crepitation. No significant hypermobility identified. There is also HAV deformity present on the left side however is not as symptomatic. No other areas of tenderness to bilateral lower extremities and no other areas of edema, erythema, increase in warmth. MMT 5/5, ROM WNL No open lesions or pre-ulcerative lesions identified. No pain with calf compression,  swelling, warmth, erythema.      Assessment & Plan:  64 year old female with symptomatic bunion deformity, right foot. -X-rays were obtained and reviewed with the patient. -Conservative and surgical options were both discussed with the patient including alternatives, risks, complications. -At this time as the patient has attempted no conservative treatment will start with this. Discussed with her shoe gear modifications, padding, offloading. Also discussed possible steroid injection to the area however she wishes to hold off at this time. -Discussed with her that if she wished at surgical intervention and she does not have improvement with conservative treatments she'll need to have medical clearance by her physicians. -Follow-up as needed. In the meantime encouraged to call the office with any questions, concerns, change in symptoms. -Follow-up with PCP for other issues mentioned in the review of systems.

## 2014-12-04 DIAGNOSIS — M4320 Fusion of spine, site unspecified: Secondary | ICD-10-CM | POA: Diagnosis not present

## 2014-12-04 DIAGNOSIS — M797 Fibromyalgia: Secondary | ICD-10-CM | POA: Diagnosis not present

## 2014-12-18 DIAGNOSIS — H40033 Anatomical narrow angle, bilateral: Secondary | ICD-10-CM | POA: Diagnosis not present

## 2014-12-25 DIAGNOSIS — H40003 Preglaucoma, unspecified, bilateral: Secondary | ICD-10-CM | POA: Diagnosis not present

## 2015-02-08 NOTE — Consult Note (Signed)
PATIENT NAME:  Catherine Hughes, Catherine Hughes MR#:  [id number] DATE OF BIRTH:  10-16-1951  DATE OF CONSULTATION:  01/19/2012  REFERRING PHYSICIAN:   CONSULTING PHYSICIAN:  Dionisio David, MD  CARDIOLOGY FOLLOWUP  The patient is doing very well. She denies any chest pain but has some cough and shortness of breath. She will be given followup with me in the office. Echocardiogram showed normal wall motion, normal ejection fraction, and trace mitral regurgitation.  Mildly elevated troponin may have been due to respiratory distress secondary to pneumonia. I would like to see her on followup and consider doing stress Myoview as an outpatient once she completely recovers from pneumonia. Thank you very much for the referral.   ____________________________ Dionisio David, MD sak:bjt D: 01/19/2012 13:14:39 ET T: 01/19/2012 13:34:32 ET JOB#: [id number]  cc: Dionisio David, MD, <Dictator> Dionisio David MD ELECTRONICALLY SIGNED 01/20/2012 7:31

## 2015-02-08 NOTE — Discharge Summary (Signed)
PATIENT NAME:  Catherine Hughes, Catherine Hughes MR#:  [id number] DATE OF BIRTH:  [birth year]  DATE OF ADMISSION:  01/16/2012 DATE OF DISCHARGE:  01/19/2012  DISCHARGE DIAGNOSES:  1. Altered mental status, encephalopathy, most likely secondary to pneumonia, now resolved.  2. Staph aureus pneumonia, methicillin sensitive. 3. Chronic pain.  4. Fibromyalgia.  5. Elevated cardiac enzymes, most likely secondary to demand ischemia. 6. Obstructive sleep apnea, not using continuous positive airway pressure.  7. Anxiety.   CONSULTATION: Cardiology, Dr. Neoma Laming.   HOSPITAL COURSE: A 64 year old female who has history of fibromyalgia, chronic pain, obstructive sleep apnea not using CPAP, anxiety, chronic pain presented with altered mental status. She has been lethargic. She is also complaining of some cough and chest congestion. When she presented to the Emergency Room she was hypoxic. She was admitted as encephalopathy, bacterial pneumonia and hypoxia. Her chest x-ray at admission showed mild bibasilar infiltrate consistent with pneumonia. She had a CT of the head done that was essentially negative. Her white count when she came in was elevated at 11.7 and her creatinine was normal at 0.98. She was started on ceftriaxone, Zithromax, given IV hydration, was placed on oxygen, DuoNebs as needed. She significantly improved with antibiotics, now she is saturating 96% on room air. Her sputum culture grew staph aureus heavy growth which is pansensitive. It is a methicillin  sensitive staph aureus. I am going to discharge her on Levaquin to complete 14 days of antibiotics. Her blood cultures have been negative. Her ABG when she came in showed pH 7.36, pCO2 46, pO2 52 on room air. She also had elevated troponins. Troponin went up to 0.57 to 0.61 with normal CPK and MB. Dr. Neoma Laming was consulted. He said it is most likely demand ischemia. Patient has no exertional chest pain. EKG does not show any acute ischemia. She remained  stable on telemetry. Her EKG showed that she has sinus rhythm, some Q waves in the septal leads but no acute ischemia. She also had an echocardiogram done which showed that the patient had ejection fraction of greater than 55%, LV function normal, no wall motion abnormalities. Patient may need a stress test as outpatient. She needs to follow up with cardiology as outpatient. Her white count has improved to 9.7. Her creatinine is normal, 0.83. When she came in she had slightly elevated AST but that resolved. Her urine drug screen when she came in showed opiate and benzodiazepine positive. Her cough has improved. Her shortness of breath has improved. She is slightly weak.   MEDICATIONS AT DISCHARGE:  1. Abilify 20 mg daily.  2. Gabapentin 600 mg 3 times a day.  3. Lamotrigine 25 mg daily. 4. Temazepam 15 mg 1 to 2 tablets once a day at bedtime. 5. Alprazolam 1 mg t.i.d.  6. Ibuprofen as needed. 7. Prozac 10 mg daily.  8. Oxycodone 10 mg 2 tablets q.4-6 hours as needed. 9. Levaquin 500 mg p.o. one daily for 10 days.  10. Ecotrin 81 mg once daily.  11. I will give her albuterol MDI if she needs it for wheezing as needed basis.   CONDITION AT DISCHARGE: She is comfortable.   PHYSICAL EXAMINATION: T-max 99, heart rate 75, blood pressure 131/76, sating 96% on room air. Chest is clear. Heart sounds are regular. Abdomen soft, nontender. Well-hydrated.   DIET: Regular diet.   FOLLOW UP: Patient should follow up with Dr. Jeananne Rama in one week. Follow up CBC and BMP at Dr. Rance Muir office. Follow up with Dr. Earlyne Iba  [facility], in two weeks for follow up and outpatient stress test.  TIME SPENT WITH DISCHARGE: 45 minutes.   ____________________________ Mena Pauls, MD ag:cms D: 01/19/2012 11:02:16 ET T: 01/20/2012 08:58:27 ET JOB#: [id number]  cc: Mena Pauls, MD, <Dictator> Guadalupe Maple, MD Dionisio David, MD Mena Pauls MD ELECTRONICALLY SIGNED 01/26/2012  18:04

## 2015-02-08 NOTE — Consult Note (Signed)
Chart reviewed, troponin is trending down, and has unremarkable EKG, and feeling better with treatment of pneumonia. Agree with current treatment plan for pnemonia and no need for any cardiac workup at this time. Demand ischaemia caused elevated troponin.  Electronic Signatures: Angelica Ran (MD)  (Signed on 02-Apr-13 13:22)  Authored  Last Updated: 02-Apr-13 13:22 by Angelica Ran (MD)

## 2015-02-08 NOTE — Consult Note (Signed)
PATIENT NAME:  Catherine Hughes, Catherine Hughes MR#:  [id number] DATE OF BIRTH:  Mar 17, 1951  DATE OF CONSULTATION:  01/17/2012  REFERRING PHYSICIAN:  Theodoro Grist, MD  CONSULTING PHYSICIAN:  Neoma Laming, MD/Montrey Buist Elease Etienne, PA-C  PRIMARY CARE PHYSICIAN: Golden Pop, MD   REASON FOR CONSULTATION: Elevated troponin.   HISTORY OF PRESENT ILLNESS: Catherine Hughes is a 64 year old white female with a past medical history significant for fibromyalgia, anxiety, chronic pain syndrome, and gastroesophageal reflux disease. She presented to the hospital with congestion in the chest, coughing, lethargy, and slurred speech. The patient states that she does not remember much when she was in the Emergency Room. She complains of some soreness in the chest that she believes is from coughing as she's coughed up a lot of phlegm tinged with blood. She has associated wheezing. She denies any shortness of breath at home and denies orthopnea and palpitations. She does have a history of sleep apnea and does not use a CPAP machine. She complains of intermittent edema. The patient is not able to ambulate well at home due to chronic pain syndrome in neck, upper extremity, and low back.   The patient denies any previous history of heart problems. She has never had any severe chest pain, chest pressure, pain in her jaw or arm. She's had stress testing many years ago.   PAST MEDICAL HISTORY:  1. Fibromyalgia.  2. Anxiety. 3. Obstructive sleep apnea, not currently using CPAP.  4. Chronic pain syndrome affecting neck, upper extremity, low back, and lower extremities.  5. Insomnia.  6. Gastroesophageal reflux disease.   PAST SURGICAL HISTORY:  1. Appendectomy.  2. Lumbar spine surgery.    ALLERGIES: No known drug allergies.   HOME MEDICATIONS:  1. Abilify 20 mg p.o. daily.  2. Alprazolam 1 mg 3 times daily.  3. Fluoxetine 20 mg 2 capsules daily (total of 40 mg per day).  4. Gabapentin 600 mg p.o. 3 times daily.  5. Ibuprofen  200 mg p.o. daily as needed for pain.  6. Lamotrigine 25 mg p.o. daily.  7. Oxycodone 20 mg every 4 to 6 hours p.o.  8. Temazepam 15 to 30 mg p.o. at bedtime as needed for sleep.   FAMILY HISTORY: Significant for sleep apnea, hypertension, coronary artery disease with stenting (mother), colon cancer (mother).   SOCIAL HISTORY: The patient is married and lives with her husband. She has grown children. She used to smoke 1 pack per day for 15 years and quit five years ago. She only drinks rare alcohol occasionally on a social basis. 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   REVIEW OF SYSTEMS: The patient complains of feeling feverish, fatigue, weakness, pain in neck, low and upper back, weight gain, productive cough, wheezing, possible hemoptysis, nausea, abdominal pain.   PHYSICAL EXAMINATION:   GENERAL: This is a pleasant female alert and oriented x3. She is sitting comfortably in bed.   VITAL SIGNS: Temperature 98.5 degrees Fahrenheit, heart rate 66, respiratory rate 20, blood pressure 113/66, oxygen saturation 100% at rest on room air.   HEENT: Head atraumatic, normocephalic.   EYES: Pupils round, equal bilaterally, reactive to light. There is no scleral icterus. Conjunctivae are pale, pink.   EARS AND NOSE: Normal to external inspection.   MOUTH: Good dentition. Moist mucous membranes.   NECK: Supple. Trachea is midline. Thyroid is smooth and mobile.   LUNGS: Clear to auscultation anteriorly. She has crackles most notably at the bases posteriorly right greater than left. No wheezing  appreciated. There is no accessory muscle use at this time.   CARDIOVASCULAR: Regular rate and rhythm. No murmurs, rubs, or gallops appreciated.   ABDOMEN: Nondistended. Bowel sounds are present. She has generalized diffuse tenderness to palpation.   EXTREMITIES: Trace to 1+ pedal edema bilaterally. No cyanosis or clubbing.   RADIOLOGICAL STUDIES: Chest x-ray mild basilar infiltrate consistent with  pneumonia. CT of the head without contrast no acute intracranial abnormality.   LABORATORY DATA: Glucose 100, BUN 9, creatinine 0.88, sodium 144, potassium 3.8, chloride 110, CO2 25, estimated GFR greater than 60, calcium 8.3, total protein 7.1, albumin 3.6, total bilirubin 0.9, alkaline phosphatase 107, AST 55, ALT 43. Total CK on admission was 498, second set was 245, third set was 159. CK-MB on admission was 5.4, second set 2.1, third set 1.2. Troponin-I on admission 0.38, second set 0.61, third set 0.57. Urine drug screen is positive for benzodiazepines and opiates. White blood cell count 14.8, hemoglobin 10.6, hematocrit 32.0, platelet count 210,000. PT 13.3. INR 1.0. Blood culture no growth to date but still pending. Sputum culture is pending. Arterial blood gas pH 7.36, pCO2 46, pO2 52, bicarb 26.   ASSESSMENT/PLAN:  1. Elevated troponin-I. The patient's elevated troponins are most likely from demand ischemia secondary to respiratory issues/pneumonia. On admission the patient had decreased oxygen saturations in the 90's. She currently denies any chest pain, chest pressure. Does not have any history of coronary disease. Agree with 12-lead EKG and echocardiogram to further evaluate. Since patient is stable at this time, would consider outpatient stress testing.  2. Bacterial pneumonia. The patient has already been started on antibiotics and has had some improvement in symptoms. Her hypoxia is improved and she has good oxygen saturations on room air.  3. History of obstructive sleep apnea, not using CPAP.  4. Encephalopathy likely secondary to acute infectious process/bacterial pneumonia in combination with pain medications. Her CT scan did not show any evidence of acute CVA.   Thank you very much for allowing [place] to participate in this patient's care. We will continue to follow the patient with you.   The case was discussed with Dr. Neoma Laming who agrees with the assessment and management of  patient as named above.   ____________________________ Merla Riches, PA-C mam:drc D: 01/17/2012 10:15:12 ET T: 01/17/2012 13:03:23 ET JOB#: [id number]  cc: Merla Riches, PA-C, <Dictator> Guadalupe Maple, MD Rhiannon Sassaman A Braxston Quinter PA ELECTRONICALLY SIGNED 01/18/2012 13:50

## 2015-02-08 NOTE — Consult Note (Signed)
Brief Consult Note: Diagnosis: 1. Elevated cardiac enzymes.   Comments: Pt with pneumonia, altered mental status on admission with decreased oxygen sats and elevated enzymes likely from demand ischemia secondary to respiratory distress. Pt is CP free at this time. Agree with echo and 12 lead EKG. Treat underlying respiratory infection and can consider outpatient cardiac stress test as long as echo and 12 lead EKG with no acute changes.  Electronic Signatures: Angelica Ran (MD)   (Signed 02-Apr-13 13:09)  Co-Signer: Brief Consult Note Marquis Diles A (PA-C)   (Signed 02-Apr-13 09:15)  Authored: Brief Consult Note  Last Updated: 02-Apr-13 13:09 by Angelica Ran (MD)

## 2015-02-08 NOTE — H&P (Signed)
PATIENT NAME:  Catherine Hughes, Catherine Hughes MR#:  [id number] DATE OF BIRTH:  [birth year]  DATE OF ADMISSION:  01/16/2012  PRIMARY CARE PHYSICIAN: Golden Pop, MD  HISTORY OF PRESENT ILLNESS: The patient is a 64 year old Caucasian female with past medical history significant for history of fibromyalgia, anxiety, chronic pain syndrome, and history of gastroesophageal reflux disease who presented to the hospital with complaints of cough as well as congestion in the chest. She has been also more lethargic as well as having slurred speech. She was also noted to have possible right facial droop. She was brought to the emergency room for further evaluation. According to the patient's husband, as the patient is only intermittently able to add on to her history, the patient got up at around 5:00 a.m. today and she was coughing up dark mucus, greenish-looking phlegm. She was shivering and chilly. She was also noted to have some slurring of speech and was not able to verbalize herself well. She was brought to the emergency room for further evaluation. In the emergency room, she was noted to be somewhat hypoxic and remains hypoxic even on a 2 liters of oxygen through nasal cannula at 90% and hospitalist services were contacted for admission of the patient for possible pneumonia as well as questionable stroke.  PAST MEDICAL HISTORY:  1. Fibromyalgia.  2. Anxiety. 3. Obstructive sleep apnea, not on CPAP.  4. Chronic pain syndrome in the neck and upper extremity as well as low back pain and lower extremity pain. 5. Insomnia. 6. Gastroesophageal reflux disease. 7. Appendectomy. 8. Lumbar spine surgery.  ALLERGIES: No known drug allergies.   MEDICATIONS:  1. Abilify 20 mg p.o. daily. 2. Alprazolam 1 mg three times daily. 3. Fluoxetine 20 mg p.o. two capsules daily, which would be 40 mg daily. 4. Gabapentin 600 mg p.o. three times daily.  5. Ibuprofen 200 mg p.o. daily as needed.  6. Lamotrigine 25 mg p.o. daily.   7. Oxycodone 20 mg p.o. every 4 to 6 hours.  8. Temazepam 15 mg to 30 mg p.o. at bedtime as needed.   FAMILY HISTORY:  Diabetes mellitus in that patient's family as well as hypertension and colon cancer in the patient's mother.   SOCIAL HISTORY: The patient is married and lives with her husband. She gave birth to two children, but she adopted children. She has a total of five children who are grown up and out from the house. She used to smoke up to one pack per day for approximately 15 years and quit 5 years ago. She drinks alcohol occasionally socially. 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   REVIEW OF SYSTEMS: Positive for feeling feverish and chilly today, fatigue and weak, pain in the lower and upper back which seems to be chronic, also weight gain of approximately 40 pounds in the past one year, some blurring of vision today, cough with dark green phlegm, wheezing, possible hemoptysis with reddish/brown phlegm yesterday, and shortness of breath since yesterday. Also intermittent chest palpitations, episodes of nausea today as well as episodes of possible vomiting. Abdominal pain with cough, also feeling lightheaded and dizzy today, having some episodes of dysuria and hematuria. She was seen by her primary care physician for that and was given cranberry extract. Gait is poor, shuffling whenever she walks. She does not use any device. Some slurring of speech. Apparently she has been having problems with slurring of speech intermittently at home whenever she takes too much medications. She has some lower extremity weakness  bilaterally. She has been falling. She has fallen down at least five times over the past two months. CONSTITUTIONAL: She denies any weight loss. EYES: Denies double vision, glaucoma, or cataracts. ENT: Denies any tinnitus, allergies, epistaxis, sinus pain, dentures, or difficulty swallowing. RESPIRATORY: Denies any asthma or chronic obstructive pulmonary disease.  CARDIOVASCULAR: Denies any chest pain, orthopnea, edema, arrhythmias, or syncope. GASTROINTESTINAL: Denies any diarrhea, hematemesis, rectal bleeding, or change in bowel habits. GENITOURINARY: Denies any incontinence or increased frequency. ENDOCRINE: Denies any polydipsia, nocturia, thyroid problems, heat or cold intolerance, or thirst. HEMATOLOGIC: Denies anemia, easy bruising, bleeding, or swollen glands. SKIN: Denies any acne, rashes, lesions, or change in moles. MUSCULOSKELETAL: Denies arthritis, cramps, swelling, or gout. NEUROLOGIC: No numbness, epilepsy, or tremor. PSYCHIATRIC: Denies anxiety or insomnia.    PHYSICAL EXAMINATION:   VITAL SIGNS: On arrival to the hospital, her temperature is 102, however reported as only 98 in triage, pulse 88, respiratory rate 18, blood pressure 135/61, and saturation 90% on 2 liters of oxygen through nasal cannula.   GENERAL: This is well-developed, well-nourished Caucasian female in no significant distress lying on the stretcher.   HEENT: Pupils are equally round and reactive to light. Extraocular movements are intact. No icterus or conjunctivitis. Has normal hearing. No pharyngeal erythema. Mucosa is moist.   NECK: Neck did not reveal any masses, supple and nontender. Thyroid is not enlarged. No adenopathy. No JVD or carotid bruits bilaterally. Full range of motion.   LUNGS: Clear to auscultation. A few rhonchi were heard and crackles were heard bilaterally at bases posteriorly. No significantly diminished breath sounds or wheezing. No labored inspirations, increased effort, dullness to percussion, or overt respiratory distress.   HEART: S1 and S2 appreciated. No murmurs, gallops, or rubs were noted. PMI not lateralized. Chest is nontender to palpation.   EXTREMITIES: 1+ pedal pulses. No lower extremity edema, calf tenderness, or cyanosis was noted.   ABDOMEN: Soft and nontender. Bowel sounds present. No hepatosplenomegaly or masses were noted.    RECTAL: Deferred.   MUSCULOSKELETAL: Able to move all extremities. No cyanosis, degenerative joint disease, or kyphosis. Gait was not tested.   SKIN: No rashes, lesions, erythema, nodularity, or induration. It was warm and dry to palpation.   LYMPH: No adenopathy in the cervical region.   NEUROLOGIC: Cranial nerves grossly intact. Sensory is intact. The patient does have some dysarthria and slurring of speech, but no aphasia.   PSYCH: The patient is somnolent, however, she is able to wake up and converse and then drifts back again to sleep. She is cooperative. Memory is somewhat impaired, but no significant confusion, agitation, or depression was noted.   LABS/STUDIES: BMP showed elevated glucose to 131, otherwise unremarkable BMP. The patient's liver enzymes showed AST elevation to 55. The patient's troponin is elevated at 0.38. White blood cell count is also elevated to 11.7. Hemoglobin is 11.9 and platelet count is 232. Coagulation panel is unremarkable.   Urinalysis: Yellow hazy urine, negative for glucose, bilirubin or ketones, specific gravity 1.014, pH 5.0, negative for blood, protein, nitrites, or leukocyte esterase, no red blood cells, 1 white blood cell, 1+ bacteria, 2 epithelial cells and present is mucus as well as calcium oxide crystals.   EKG showed normal sinus rhythm at 92 beats per minute, normal axis, and septal infarct age undetermined. No acute ST-T changes were noted. No EKG to compare with.   Chest x-ray, PA and lateral, on 01/16/2012, showed mild basilar infiltrate consistent with pneumonia.   CT of head  without contrast, on 01/16/2012, showed no acute intracranial abnormality.   ASSESSMENT AND PLAN:  1. Encephalopathy, likely metabolic due to fever and lung infection as well as pain medications. Follow neurologically.  2. Bacterial pneumonia. Get sputum cultures. Start the patient on Rocephin and Zithromax.  3. Hypoxia. Continue the patient on oxygen therapy and  get ABGs.  4. History of obstructive sleep apnea, now seems to be very somnolent. Get ABGs as above.  5. Chronic pain syndrome. Continue pain medications only as needed.            6. History of anxiety. Hold high doses of alprazolam. Continue low doses of alprazolam and only as needed. Continue fluoxetine.   TIME SPENT: Time spent was 1 hour, 15 minutes.  ____________________________ Theodoro Grist, MD rv:slb D: 01/16/2012 15:35:21 ET T: 01/16/2012 16:10:35 ET JOB#: [id number]  cc: Theodoro Grist, MD, <Dictator> Guadalupe Maple, MD [place] MD ELECTRONICALLY SIGNED 01/18/2012 14:26

## 2015-03-19 ENCOUNTER — Ambulatory Visit (INDEPENDENT_AMBULATORY_CARE_PROVIDER_SITE_OTHER): Payer: Medicare Other | Admitting: Family Medicine

## 2015-03-19 ENCOUNTER — Encounter: Payer: Self-pay | Admitting: Family Medicine

## 2015-03-19 VITALS — BP 110/75 | HR 68 | Temp 98.8°F | Ht 64.3 in | Wt 186.0 lb

## 2015-03-19 DIAGNOSIS — N390 Urinary tract infection, site not specified: Secondary | ICD-10-CM | POA: Insufficient documentation

## 2015-03-19 DIAGNOSIS — M4320 Fusion of spine, site unspecified: Secondary | ICD-10-CM | POA: Insufficient documentation

## 2015-03-19 DIAGNOSIS — M4326 Fusion of spine, lumbar region: Secondary | ICD-10-CM | POA: Diagnosis not present

## 2015-03-19 MED ORDER — CIPROFLOXACIN HCL 250 MG PO TABS: 250.0000 mg | ORAL_TABLET | Freq: Two times a day (BID) | ORAL | Status: DC

## 2015-03-19 NOTE — Assessment & Plan Note (Signed)
Discuss fluid meds and care Tylenol

## 2015-03-19 NOTE — Progress Notes (Signed)
BP 110/75 mmHg  Pulse 68  Temp(Src) 98.8 F (37.1 C)  Ht 5' 4.3" (1.633 m)  Wt 186 lb (84.369 kg)  BMI 31.64 kg/m2  SpO2 99%   Subjective:    Patient ID: Catherine Hughes, female    DOB: 10-12-1951, 64 y.o.   MRN: [id number]  HPI: Catherine Hughes is a 64 y.o. female presenting on 03/19/2015 for Pain and Urinary Tract Infection  Fibromyalgia: stable  Chronic pain stable Discussed need for pain clinic to [facility] ([place]) pt wants to stop meds anyway over time  Urinary Tract Infection: Patient complains of abnormal smelling urine, burning with urination, constipation and dysuria She has had symptoms for 3 day. Patient also complains of back pain. Patient denies fever. Patient does have a history of recurrent UTI.  Patient does not have a history of pyelonephritis.     Relevant past medical, surgical, family and social history reviewed and updated as indicated. Interim medical history since our last visit reviewed. Allergies and medications reviewed and updated.  Current Outpatient Prescriptions on File Prior to Visit  Medication Sig  . ALPRAZolam (XANAX) 1 MG tablet   . FLUoxetine (PROZAC) 20 MG capsule Take 20 mg by mouth daily.  Marland Kitchen gabapentin (NEURONTIN) 600 MG tablet   . HYDROcodone-acetaminophen (NORCO/VICODIN) 5-325 MG per tablet   . lidocaine (LIDODERM) 5 % Place 1 patch onto the skin daily. Remove & Discard patch within 12 hours or as directed by MD  . Oxycodone HCl 10 MG TABS   . temazepam (RESTORIL) 15 MG capsule    No current facility-administered medications on file prior to visit.    Review of Systems  Constitutional: Negative.   Respiratory: Negative.   Cardiovascular: Negative.     Per HPI unless specifically indicated above     Objective:    BP 110/75 mmHg  Pulse 68  Temp(Src) 98.8 F (37.1 C)  Ht 5' 4.3" (1.633 m)  Wt 186 lb (84.369 kg)  BMI 31.64 kg/m2  SpO2 99%  Wt Readings from Last 3 Encounters:  03/19/15 186 lb (84.369 kg)  12/04/14 192 lb  (87.091 kg)  11/13/14 193 lb (87.544 kg)    Physical Exam  Constitutional: She is oriented to person, place, and time. She appears well-developed and well-nourished. No distress.  HENT:  Head: Normocephalic and atraumatic.  Right Ear: Hearing normal.  Left Ear: Hearing normal.  Nose: Nose normal.  Eyes: Conjunctivae and lids are normal. Right eye exhibits no discharge. Left eye exhibits no discharge. No scleral icterus.  Cardiovascular: Normal rate, regular rhythm and normal heart sounds.   Pulmonary/Chest: Effort normal and breath sounds normal. No respiratory distress.  Abdominal: Soft. There is no tenderness.  Musculoskeletal: Normal range of motion.  Neurological: She is alert and oriented to person, place, and time.  Skin: Skin is intact. No rash noted.  Psychiatric: She has a normal mood and affect. Her speech is normal and behavior is normal. Judgment and thought content normal. Cognition and memory are normal.        Assessment & Plan:   Problem List Items Addressed This Visit      Musculoskeletal and Integument   Fusion of spine - Primary    Discussed chronic pain manamgent and use of controlled meds Pt wants to decrease oxycodone to 5mg  next rx and will need pain clinic referral          Meds ordered this encounter  Medications  . ciprofloxacin (CIPRO) 250 MG tablet  Sig: Take 1 tablet (250 mg total) by mouth 2 (two) times daily.    Dispense:  6 tablet    Refill:  0    Follow up plan: No Follow-up on file.

## 2015-03-19 NOTE — Assessment & Plan Note (Signed)
Discussed chronic pain manamgent and use of controlled meds Pt wants to decrease oxycodone to 5mg  next rx and will need pain clinic referral

## 2015-03-27 ENCOUNTER — Telehealth: Payer: Self-pay | Admitting: Family Medicine

## 2015-03-27 MED ORDER — CIPROFLOXACIN HCL 250 MG PO TABS: 250.0000 mg | ORAL_TABLET | Freq: Two times a day (BID) | ORAL | Status: DC

## 2015-03-27 NOTE — Telephone Encounter (Signed)
He did not order a urine.

## 2015-03-27 NOTE — Telephone Encounter (Signed)
Pt. Notified.

## 2015-03-27 NOTE — Telephone Encounter (Signed)
Dr. Sanda Klein, this patient saw Dr. Loletha Grayer for UTI. I don't want it to wait thru the weekend. Would you be willing to advise?

## 2015-03-27 NOTE — Telephone Encounter (Signed)
I realized I had misread Dr. Delight Ovens message and that she did prescribe a rx for Cipro. I called pt back and notified her that it was called in and if no improvement and/or she gets worse over the weekend to go to urgent care.

## 2015-03-27 NOTE — Telephone Encounter (Signed)
Okay, I would suggest that she go to an urgent care and get a urine check I reviewed the office note The only thing I can offer at 5:17 pm on a Friday afternoon is to give her some cipro and tell her to go to urgent care over the weekend if better

## 2015-03-27 NOTE — Telephone Encounter (Signed)
Pt called stated her UTI has not gone away, wants to know if more Cipro can be called in to [facility] in [place]. Please call pt with any issues @ [phone]. Thanks.

## 2015-03-27 NOTE — Telephone Encounter (Signed)
I don't actually see a urine in the system; can you check with Lab to see if urinalysis and/or culture done Thank you, Dr. Sanda Klein

## 2015-04-01 ENCOUNTER — Other Ambulatory Visit: Payer: Self-pay | Admitting: Family Medicine

## 2015-04-01 MED ORDER — OXYCODONE HCL 10 MG PO TABS: 10.0000 mg | ORAL_TABLET | Freq: Four times a day (QID) | ORAL | Status: DC | PRN

## 2015-04-01 MED ORDER — HYDROCODONE-ACETAMINOPHEN 5-325 MG PO TABS: 1.0000 | ORAL_TABLET | Freq: Four times a day (QID) | ORAL | Status: DC | PRN

## 2015-04-09 DIAGNOSIS — R300 Dysuria: Secondary | ICD-10-CM | POA: Diagnosis not present

## 2015-04-09 DIAGNOSIS — R070 Pain in throat: Secondary | ICD-10-CM | POA: Diagnosis not present

## 2015-04-09 DIAGNOSIS — N390 Urinary tract infection, site not specified: Secondary | ICD-10-CM | POA: Diagnosis not present

## 2015-04-21 ENCOUNTER — Telehealth: Payer: Self-pay

## 2015-04-21 DIAGNOSIS — R3989 Other symptoms and signs involving the genitourinary system: Secondary | ICD-10-CM

## 2015-04-21 NOTE — Telephone Encounter (Signed)
Notified patient that she needed to be seen by [place], she was actually seen on March 19, 2015 for a UTI. Can I put an order in for Urology.

## 2015-04-21 NOTE — Telephone Encounter (Signed)
Yes. Unfortunately. I'll be a 15 min though

## 2015-04-21 NOTE — Telephone Encounter (Signed)
Patient called, she has went to Urgent Care three times for a UTI. The cultures have all been negative. They recommend her going to a Urologist. Does she need to be seen for this first by [place] since she has medicare?

## 2015-04-21 NOTE — Telephone Encounter (Signed)
Referral generated

## 2015-05-03 ENCOUNTER — Emergency Department
Admission: EM | Admit: 2015-05-03 | Discharge: 2015-05-03 | Disposition: A | Discharge status: Home / Self Care | Payer: Medicare Other | Attending: Emergency Medicine | Admitting: Emergency Medicine

## 2015-05-03 DIAGNOSIS — Z79899 Other long term (current) drug therapy: Secondary | ICD-10-CM | POA: Diagnosis not present

## 2015-05-03 DIAGNOSIS — Z792 Long term (current) use of antibiotics: Secondary | ICD-10-CM | POA: Diagnosis not present

## 2015-05-03 DIAGNOSIS — R350 Frequency of micturition: Secondary | ICD-10-CM | POA: Diagnosis not present

## 2015-05-03 DIAGNOSIS — Z87891 Personal history of nicotine dependence: Secondary | ICD-10-CM | POA: Insufficient documentation

## 2015-05-03 LAB — URINALYSIS COMPLETE WITH MICROSCOPIC (ARMC ONLY)
BACTERIA UA: NONE SEEN
Bilirubin Urine: NEGATIVE
Glucose, UA: NEGATIVE mg/dL
HGB URINE DIPSTICK: NEGATIVE
Ketones, ur: NEGATIVE mg/dL
Nitrite: NEGATIVE
PROTEIN: NEGATIVE mg/dL
Specific Gravity, Urine: 1.016 (ref 1.005–1.030)
pH: 7 (ref 5.0–8.0)

## 2015-05-03 MED ORDER — OXYBUTYNIN CHLORIDE ER 10 MG PO TB24: 10.0000 mg | ORAL_TABLET | Freq: Every day | ORAL | Status: DC

## 2015-05-03 MED ORDER — SULFAMETHOXAZOLE-TRIMETHOPRIM 800-160 MG PO TABS
ORAL_TABLET | ORAL | Status: AC
  Filled 2015-05-03: qty 1

## 2015-05-03 MED ORDER — SULFAMETHOXAZOLE-TRIMETHOPRIM 800-160 MG PO TABS: 1.0000 | ORAL_TABLET | Freq: Two times a day (BID) | ORAL | Status: DC

## 2015-05-03 MED ORDER — SULFAMETHOXAZOLE-TRIMETHOPRIM 800-160 MG PO TABS
1.0000 | ORAL_TABLET | Freq: Two times a day (BID) | ORAL | Status: DC
  Administered 2015-05-03: 1 via ORAL

## 2015-05-03 NOTE — ED Notes (Signed)
Pt c/o urinary urgency and lower abdominal pain. Pt states it feels like pressure and then has the sensation her bladder is still full.

## 2015-05-03 NOTE — ED Provider Notes (Signed)
[facility] Emergency Department Provider Note ____________________________________________  Time seen: Approximately 11:36 PM  I have reviewed the triage vital signs and the nursing notes.   HISTORY  Chief Complaint Urinary Frequency   HPI Catherine Hughes is a 64 y.o. female who presents to the emergency department for a 6 week history of urinary frequency. She states that she has been to her primary care provider twice and urgent care. She has had 3 rounds of Cipro. She states that as long as she is taking the Cipro she does not have frequency or pain, however after the Cipro is out of her system the pain returns and the frequency begins again. She states that she feels like she has to "a gallon but then only a tablespoon comes out."   Past Medical History  Diagnosis Date  . Menopausal state   . Anxiety   . Depression   . Bipolar 1 disorder   . Fibromyalgia   . Arthritis   . Glaucoma   . Obesity (BMI 30.0-34.9)     Patient Active Problem List   Diagnosis Date Noted  . Urinary tract infection 03/19/2015  . 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 Appendectomy    . Breast surgery Bilateral implants and removal  . Spine surgery  L4-5    fusion and coil surgery  . Fracture surgery Right 2014    ankle    Current Outpatient Rx  Name  Route  Sig  Dispense  Refill  . ALPRAZolam (XANAX) 1 MG tablet            1   . ciprofloxacin (CIPRO) 250 MG tablet   Oral   Take 1 tablet (250 mg total) by mouth 2 (two) times daily.   6 tablet   0   . FLUoxetine (PROZAC) 20 MG capsule   Oral   Take 20 mg by mouth daily.         Marland Kitchen gabapentin (NEURONTIN) 600 MG tablet            2   . HYDROcodone-acetaminophen (NORCO/VICODIN) 5-325 MG per tablet   Oral   Take 1 tablet by mouth every 6 (six) hours as needed for moderate pain.   56 tablet   0   . lidocaine (LIDODERM) 5 %   Transdermal   Place 1 patch  onto the skin daily. Remove & Discard patch within 12 hours or as directed by MD         . oxybutynin (DITROPAN XL) 10 MG 24 hr tablet   Oral   Take 1 tablet (10 mg total) by mouth daily.   14 tablet   0   . Oxycodone HCl 10 MG TABS   Oral   Take 1 tablet (10 mg total) by mouth 4 (four) times daily as needed.   168 tablet   0   . sulfamethoxazole-trimethoprim (BACTRIM DS,SEPTRA DS) 800-160 MG per tablet   Oral   Take 1 tablet by mouth 2 (two) times daily.   20 tablet   0   . temazepam (RESTORIL) 15 MG capsule            1     Allergies Effexor  Family History  Problem Relation Age of Onset  . Cancer Mother     colon  . Diabetes Mother   . Coronary artery disease Mother     Social History History  Substance Use Topics  .  Smoking status: Former Smoker    Quit date: 03/18/1989  . Smokeless tobacco: Never Used  . Alcohol Use: 0.0 oz/week    0 Standard drinks or equivalent per week     Comment: rarely    Review of Systems Constitutional: No fever/chills Cardiovascular: Denies chest pain. Respiratory: Denies shortness of breath or cough. Gastrointestinal: Abdominal pain-only when Cipro wears off., nausea no, vomitingno. Genitourinary: Dysuria no, vaginal discharge no.. Musculoskeletal: Negative for back pain. Skin: Negative for rash. Neurological: Negative for headaches, focal weakness or numbness.  10-point ROS otherwise negative.  ____________________________________________   PHYSICAL EXAM:  VITAL SIGNS: ED Triage Vitals  Enc Vitals Group     BP 05/03/15 2201 114/88 mmHg     Pulse Rate 05/03/15 2201 89     Resp 05/03/15 2201 20     Temp 05/03/15 2201 98.4 F (36.9 C)     Temp Source 05/03/15 2201 Oral     SpO2 05/03/15 2201 95 %     Weight 05/03/15 2201 175 lb (79.379 kg)     Height 05/03/15 2201 5\' 5"  (1.651 m)     Head Cir --      Peak Flow --      Pain Score 05/03/15 2202 5     Pain Loc --      Pain Edu? --      Excl. in [place]? --      Constitutional: Alert and oriented. Well appearing and in no acute distress. Eyes: Conjunctivae are normal. PERRL. EOMI. Head: Atraumatic. Nose: No congestion/rhinnorhea. Mouth/Throat: Mucous membranes are moist.  Oropharynx non-erythematous. Neck: No stridor. Cardiovascular: Good peripheral circulation. Respiratory: Normal respiratory effort.  No retractions. Gastrointestinal: Soft and nontender. No distention. No abdominal bruits. Genitourinary: Pelvic exam: Deferred Musculoskeletal: No extremity tenderness nor edema.  Neurologic:  Normal speech and language. No gross focal neurologic deficits are appreciated. Speech is normal. No gait instability. Skin:  Skin is warm, dry and intact. No rash noted. Psychiatric: Mood and affect are normal. Speech and behavior are normal.  ____________________________________________   LABS (all labs ordered are listed, but only abnormal results are displayed)  Labs Reviewed  URINALYSIS COMPLETEWITH MICROSCOPIC ([place]) - Abnormal; Notable for the following:    Color, Urine AMBER (*)    APPearance CLEAR (*)    Leukocytes, UA TRACE (*)    Squamous Epithelial / LPF 0-5 (*)    All other components within normal limits   ____________________________________________  RADIOLOGY   ____________________________________________   PROCEDURES  Procedure(s) performed: Bladder Scan: 51ml  ____________________________________________   INITIAL IMPRESSION / ASSESSMENT AND PLAN / ED COURSE  Pertinent labs & imaging results that were available during my care of the patient were reviewed by me and considered in my medical decision making (see chart for details).  Patient was advised to keep her appointment with the urologist. She will be given a prescription for Bactrim due to the high resistance rate of Cipro. She will also receive a prescription for oxybutynin. She was advised to return to the emergency department for symptoms that change or  worsen if she is unable to schedule an appointment. ____________________________________________   FINAL CLINICAL IMPRESSION(S) / ED DIAGNOSES  Final diagnoses:  Urinary frequency      Victorino Dike, FNP 05/04/15 0007  Lavonia Drafts, MD 05/04/15 1246

## 2015-05-03 NOTE — ED Notes (Signed)
Patient reports symptoms for approx 6 weeks.  Urinary frequency, burning, urgency.  States she has had 3 rounds of cipro without relief.

## 2015-05-06 ENCOUNTER — Other Ambulatory Visit: Payer: Self-pay | Admitting: Family Medicine

## 2015-05-06 MED ORDER — OXYCODONE HCL 10 MG PO TABS: 10.0000 mg | ORAL_TABLET | Freq: Four times a day (QID) | ORAL | Status: DC | PRN

## 2015-05-06 MED ORDER — HYDROCODONE-ACETAMINOPHEN 5-325 MG PO TABS: 1.0000 | ORAL_TABLET | Freq: Four times a day (QID) | ORAL | Status: DC | PRN

## 2015-05-07 ENCOUNTER — Ambulatory Visit: Payer: Medicare Other | Admitting: Urology

## 2015-05-14 ENCOUNTER — Ambulatory Visit: Payer: Medicare Other

## 2015-05-14 ENCOUNTER — Ambulatory Visit (INDEPENDENT_AMBULATORY_CARE_PROVIDER_SITE_OTHER): Payer: Medicare Other | Admitting: Urology

## 2015-05-14 ENCOUNTER — Encounter: Payer: Self-pay | Admitting: Urology

## 2015-05-14 VITALS — Wt 176.6 lb

## 2015-05-14 DIAGNOSIS — K5909 Other constipation: Secondary | ICD-10-CM

## 2015-05-14 DIAGNOSIS — R310 Gross hematuria: Secondary | ICD-10-CM | POA: Diagnosis not present

## 2015-05-14 DIAGNOSIS — R350 Frequency of micturition: Secondary | ICD-10-CM | POA: Diagnosis not present

## 2015-05-14 DIAGNOSIS — R3989 Other symptoms and signs involving the genitourinary system: Secondary | ICD-10-CM | POA: Diagnosis not present

## 2015-05-14 LAB — URINALYSIS, COMPLETE
Bilirubin, UA: NEGATIVE
Glucose, UA: NEGATIVE
Ketones, UA: NEGATIVE
Leukocytes, UA: NEGATIVE
NITRITE UA: NEGATIVE
PH UA: 5.5 (ref 5.0–7.5)
Protein, UA: NEGATIVE
Specific Gravity, UA: >1.03 — ABNORMAL HIGH (ref 1.005–1.030)
Urobilinogen, Ur: 0.2 mg/dL (ref 0.2–1.0)

## 2015-05-14 LAB — MICROSCOPIC EXAMINATION: RBC, UA: NONE SEEN /hpf (ref 0–?)

## 2015-05-14 LAB — BLADDER SCAN AMB NON-IMAGING

## 2015-05-14 MED ORDER — OXYBUTYNIN CHLORIDE ER 10 MG PO TB24: 10.0000 mg | ORAL_TABLET | Freq: Every day | ORAL | Status: DC

## 2015-05-14 NOTE — Progress Notes (Signed)
05/14/2015 3:09 PM   Catherine Hughes [birth year] [id number]  Referring provider: Guadalupe Maple, MD [address] [place], [place] [address]  Chief Complaint  Patient presents with  . Bladder Pain    Onset 6-8 weeks, has been to PCP, ER and Urgent Care. 4 different antibiotics they helped with the pain and then after takin antibiotics, 3 days the pain came back.   . Hematuria    Intermittent    HPI: 64 yo F with history of chronic back pain and fibromyalgia referred for history of urinary frequency, bladder pain, and spasms x 8 weeks.  She describes her pain is continuous located in her suprapubic area extending out to each iliac bone bilaterally.  She does experience worsening of pain with bladder filling and some relief with voiding.  She reports that she has been doubled over in pain sometimes it so severe. She describes the sensation as though she needs to urinate suddenly but can't.  She voids every 30 min to 1 hours daily.  At night, she gets up 5-6 night to urinate since her symptoms began.  She has self medicating with oxycodone and vicodin which does not seem to help.     She has been seen multiple times in the ED, her PCP Dr. Luvenia Heller, and urgent care fo these issues.  She has been treated multiple times for UTI.  She reports that urgent care result of culture was no growth but she has always improved with abx.   After completing a course of antibiotics, her symptoms typically recur within 2-3 days.  Her most recent visit was to the emergency room on 05/03/2015 at which time her UA was completely negative. Despite this, she was given a prolonged course of Bactrim in addition to oxybutynin 10 mg daily.  She reports that currently, her symptoms are fairly well controlled since starting these 2 medications but today is her last day. She is anxious that her symptoms will soon return.  She denies any history of recurrent urinary tract infections, recurrent UTIs, or previous episodes of  bladder pain prior to 8 weeks ago.   No associated fevers and chills. She has had 2 episodes of gross hematuria since her pain started which she associates with UTI.    She drinks mostly water.  No coffee or tea.  She is currently on a weight loss diet.    Prior to 8 weeks ago, she denies any urgency/ frequency.  She has noticed a change in caliber with her stream with decreasing stream over the past few years.    She is not currently sexually active. She denies any vaginal bulging or any other symptoms of pelvic organ prolapse.    She does note today that she's been fairly constipated over the past few days. She had to manually disimpact herself this morning.  PMH: Past Medical History  Diagnosis Date  . Menopausal state   . Anxiety   . Depression   . Bipolar 1 disorder   . Fibromyalgia   . Arthritis   . Glaucoma   . Obesity (BMI 30.0-34.9)     Surgical History: Past Surgical History  Procedure Laterality Date  . Appendectomy    . Breast surgery Bilateral implants and removal  . Spine surgery  L4-5    fusion and coil surgery  . Fracture surgery Right 2014    ankle    Home Medications:    Medication List       This list is  accurate as of: 05/14/15  3:09 PM.  Always use your most recent med list.               ALPRAZolam 1 MG tablet  Commonly known as:  XANAX     FLUoxetine 20 MG capsule  Commonly known as:  PROZAC  Take 20 mg by mouth daily.     gabapentin 600 MG tablet  Commonly known as:  NEURONTIN     HYDROcodone-acetaminophen 5-325 MG per tablet  Commonly known as:  NORCO/VICODIN  Take 1 tablet by mouth every 6 (six) hours as needed for moderate pain.     lidocaine 5 %  Commonly known as:  LIDODERM  Place 1 patch onto the skin daily. Remove & Discard patch within 12 hours or as directed by MD     oxybutynin 10 MG 24 hr tablet  Commonly known as:  DITROPAN XL  Take 1 tablet (10 mg total) by mouth daily.     Oxycodone HCl 10 MG Tabs  Take 1 tablet  (10 mg total) by mouth 4 (four) times daily as needed.     phenazopyridine 100 MG tablet  Commonly known as:  PYRIDIUM  Take 1 tablet by mouth as needed.     temazepam 15 MG capsule  Commonly known as:  RESTORIL        Allergies:  Allergies  Allergen Reactions  . Effexor [Venlafaxine]     euphoric    Family History: Family History  Problem Relation Age of Onset  . Cancer Mother     colon  . Diabetes Mother   . Coronary artery disease Mother     Social History:  reports that she quit smoking about 26 years ago. She has never used smokeless tobacco. She reports that she drinks alcohol. She reports that she does not use illicit drugs.  ROS: UROLOGY Frequent Urination?: Yes Hard to postpone urination?: Yes Burning/pain with urination?: No Get up at night to urinate?: Yes Leakage of urine?: Yes Urine stream starts and stops?: Yes Trouble starting stream?: Yes Do you have to strain to urinate?: Yes Blood in urine?: No Urinary tract infection?: Yes Sexually transmitted disease?: No Injury to kidneys or bladder?: No Painful intercourse?: No Weak stream?: Yes Currently pregnant?: No Vaginal bleeding?: No Last menstrual period?: 2001  Gastrointestinal Nausea?: Yes Vomiting?: No Indigestion/heartburn?: Yes Diarrhea?: No Constipation?: Yes  Constitutional Fever: No Night sweats?: No Weight loss?: Yes Fatigue?: Yes  Skin Skin rash/lesions?: No Itching?: Yes  Eyes Blurred vision?: Yes Double vision?: No  Ears/Nose/Throat Sore throat?: No Sinus problems?: No  Hematologic/Lymphatic Swollen glands?: No Easy bruising?: No  Cardiovascular Leg swelling?: Yes Chest pain?: Yes  Respiratory Cough?: No Shortness of breath?: No  Endocrine Excessive thirst?: No  Musculoskeletal Back pain?: Yes Joint pain?: Yes  Neurological Headaches?: No Dizziness?: Yes  Psychologic Depression?: Yes Anxiety?: Yes  Physical Exam: Wt 176 lb 9.6 oz (80.105  kg)  Constitutional:  Alert and oriented, No acute distress. Her daughter is present today in the examination room. HEENT: [name], moist mucus membranes.  Trachea midline, no masses. Cardiovascular: No clubbing, cyanosis, or edema. Respiratory: Normal respiratory effort, no increased work of breathing. GI: Abdomen is soft, nontender, nondistended, suprapubic tenderness with deep palpation. GU: No CVA tenderness.  Skin: No rashes, bruises or suspicious lesions. Lymph: No cervical or inguinal adenopathy. Neurologic: Grossly intact, no focal deficits, moving all 4 extremities. Psychiatric: Normal mood and affect.  Laboratory Data: Lab Results  Component Value Date  WBC 9.7 01/18/2012   HGB 10.4* 01/18/2012   HCT 31.6* 01/18/2012   MCV 92 01/18/2012   PLT 203 01/18/2012    Lab Results  Component Value Date   CREATININE 0.83 01/18/2012     Urinalysis Results for orders placed or performed in visit on 05/14/15  Microscopic Examination  Result Value Ref Range   WBC, UA 0-5 0 -  5 /hpf   RBC, UA None seen 0 -  2 /hpf   Epithelial Cells (non renal) 0-10 0 - 10 /hpf   Casts Present (A) None seen /lpf   Cast Type Hyaline casts N/A   Crystals Present (A) N/A   Crystal Type Amorphous Sediment N/A   Bacteria, UA Few None seen/Few  Urinalysis, Complete  Result Value Ref Range   Specific Gravity, UA >1.030 (H) 1.005 - 1.030   pH, UA 5.5 5.0 - 7.5   Color, UA Yellow Yellow   Appearance Ur Clear Clear   Leukocytes, UA Negative Negative   Protein, UA Negative Negative/Trace   Glucose, UA Negative Negative   Ketones, UA Negative Negative   RBC, UA Trace (A) Negative   Bilirubin, UA Negative Negative   Urobilinogen, Ur 0.2 0.2 - 1.0 mg/dL   Nitrite, UA Negative Negative   Microscopic Examination See below:   BLADDER SCAN AMB NON-IMAGING  Result Value Ref Range   Scan Result 47ml      Pertinent Imaging: Bladder scan 42 cc  Assessment & Plan:  A 64 year old female with  multiple urinary complaints today 8 weeks including suprapubic pain, pain with bladder filling, and increased urgency/ frequency.  UA today negative and also negative on previous occasions.  Questionable history of urinary tract infection with associated gross hematuria, no culture data/ UA to support.    1. Bladder pain Patient has symptoms consistent with possible interstitial cystitis but hesitate to make this diagnosis at this time as it tends to be a diagnosis of exclusion. We discussed the diease pathophysiology is poorly understood therefore treatment has been focused primarily on symptomatic relief as well as dietary and behavioral modification. Information pamphlets were reviewed and given today discussing the current understanding of the syndrome as well as treatment options. IC dietary information also given today as many patients experience relief with simple lifestyle modifications.   If conservative management fails, will consider further work up with cystoscopy to access for Hunter's ulcers or other more aggressive treatments, however, response to each of these interventions is highly variable.   - Urinalysis, Complete  2. Urinary frequency Patient has seen some improvement since being started on ditropan in 10 days ago by the ER. Recommend continuation of this medication now that antibiotics course is complete to assess whether or not this helps with her urinary symptoms. RTC in 4 weeks to check PVR. - BLADDER SCAN AMB NON-IMAGING -prescribed ditropan 10 mg XL daily  3. Gross hematuria Patient reports 2 episodes of gross hematuria which she associates with an infection. There is no evidence of culture/UA data to support true infection. Given this questionable history, will continue to reassess.  No evidence of microscopic hematuria on UA today.  Patient may need gross hematuria workup in the future.  4. Other constipation Recommend the addition of Colace 100 mg twice a day to  current regimen as oxybutynin can cause significant constipation. We also discussed the importance of hydration with water and the functional relationship between bowel and bladder.   Return in about 4 weeks (around 06/11/2015) for PVR,  pelvic exam.  Hollice Espy, MD  [facility] [address], [place], [place] [address] [phone]  I spent 45 min with this patient of which greater than 50% was spent in counseling and coordination of care with the patient.

## 2015-05-14 NOTE — Patient Instructions (Signed)

## 2015-05-19 ENCOUNTER — Ambulatory Visit: Payer: Medicare Other | Admitting: Urology

## 2015-05-26 ENCOUNTER — Telehealth: Payer: Self-pay

## 2015-05-26 NOTE — Telephone Encounter (Signed)
Pt called stating the current treatment plan is not working-oxybutynin. Pt c/o burning on urination and feels like she has a "raging" UTI, even though she knows that she doesn't. Pt states she can not wait until 06/11/15 to be seen again. Please advise.-per Sharyn Lull we dont have any available appts until Sept 1.

## 2015-05-26 NOTE — Telephone Encounter (Signed)
Spoke with pt who stated she would like to try rescue solutions and uribell. Pt will come 05/28/15 for solution and medication samples.

## 2015-05-26 NOTE — Telephone Encounter (Signed)
Let's try her on your Uribell  And offer her rescue solution into the bladder to see if that helps. This can be a nursing visit and we'll continue to plan to see her on September 1.  Hollice Espy, MD

## 2015-06-08 ENCOUNTER — Other Ambulatory Visit: Payer: Self-pay | Admitting: Family Medicine

## 2015-06-08 ENCOUNTER — Ambulatory Visit: Payer: Medicare Other

## 2015-06-08 MED ORDER — OXYCODONE HCL 10 MG PO TABS: 10.0000 mg | ORAL_TABLET | Freq: Four times a day (QID) | ORAL | Status: DC | PRN

## 2015-06-08 MED ORDER — HYDROCODONE-ACETAMINOPHEN 5-325 MG PO TABS: 1.0000 | ORAL_TABLET | Freq: Four times a day (QID) | ORAL | Status: DC | PRN

## 2015-06-08 NOTE — Progress Notes (Signed)
Phone call Discussed with patient referral to pain clinic for management of pain medications Patient unable to successfully taper on her own. Is going to be out of town for the month of October and into November Patient will be going to pain clinic this winter to manage her pain medicines for now I will continue her pain medicines.

## 2015-06-08 NOTE — Progress Notes (Signed)
Call patient to discuss pain medication next appointment and pain referral

## 2015-06-11 ENCOUNTER — Encounter: Payer: Self-pay | Admitting: Urology

## 2015-06-11 ENCOUNTER — Ambulatory Visit (INDEPENDENT_AMBULATORY_CARE_PROVIDER_SITE_OTHER): Payer: Medicare Other | Admitting: Urology

## 2015-06-11 VITALS — BP 105/70 | HR 76 | Resp 16 | Ht 64.0 in | Wt 171.6 lb

## 2015-06-11 DIAGNOSIS — R350 Frequency of micturition: Secondary | ICD-10-CM | POA: Diagnosis not present

## 2015-06-11 DIAGNOSIS — N952 Postmenopausal atrophic vaginitis: Secondary | ICD-10-CM

## 2015-06-11 DIAGNOSIS — R310 Gross hematuria: Secondary | ICD-10-CM

## 2015-06-11 DIAGNOSIS — R102 Pelvic and perineal pain: Secondary | ICD-10-CM

## 2015-06-11 DIAGNOSIS — R3989 Other symptoms and signs involving the genitourinary system: Secondary | ICD-10-CM

## 2015-06-11 LAB — URINALYSIS, COMPLETE
Bilirubin, UA: POSITIVE — AB
Glucose, UA: NEGATIVE
Nitrite, UA: NEGATIVE
PH UA: 5 (ref 5.0–7.5)
RBC, UA: NEGATIVE
Specific Gravity, UA: >1.03 — ABNORMAL HIGH (ref 1.005–1.030)
UUROB: 1 mg/dL (ref 0.2–1.0)

## 2015-06-11 LAB — MICROSCOPIC EXAMINATION
Bacteria, UA: NONE SEEN
RBC, UA: NONE SEEN /hpf (ref 0–?)

## 2015-06-11 LAB — BLADDER SCAN AMB NON-IMAGING

## 2015-06-11 NOTE — Progress Notes (Signed)
06/11/2015 3:26 PM   Catherine Hughes July 15, 1951 [id number]  Referring provider: Guadalupe Maple, MD [address] [place], [place] [address]  Chief Complaint  Patient presents with  . Follow-up    4 week  . Pelvic Pain    HPI: Patient is a 64 year old white female with a history of multiple urinary complaints for the last 2 months.  Her complaints was urinary frequency and the emergency room had initiated her on oxybutynin XL 10 mg daily approximately 6 weeks ago.  She has noticed some improvement of her urinary frequency, but she is still experiencing nocturia 2-6 times nightly, intermittency, hesitancy, straining to urinate and a weak urinary stream. Her UA today was unremarkable and her PVR was minimal at 28 mL.  Prior to taking the oxybutynin, the patient was voiding every 30 minutes to an hour daily. She states now that she is urinating once every hour while awake.  She has had symptom control while on antibiotics, but urine cultures have been negative.    Her constipation is improving with the Colace.    She denies any history of recurrent urinary tract infections, recurrent UTI's, or previous episodes of bladder pain prior to 8 weeks ago. No associated fevers and chills. She has had 2 episodes of gross hematuria since her pain started which she associates with UTI.   She drinks mostly water. No coffee or tea. She is currently on a weight loss diet.   Prior to 8 weeks ago, she denies any urgency/ frequency. She has noticed a change in caliber with her stream with decreasing stream over the past few years.   She is not currently sexually active. She denies any vaginal bulging or any other symptoms of pelvic organ prolapse.   PMH: Past Medical History  Diagnosis Date  . Menopausal state   . Anxiety   . Depression   . Bipolar 1 disorder   . Fibromyalgia   . Arthritis   . Glaucoma   . Obesity (BMI 30.0-34.9)     Surgical History: Past Surgical History    Procedure Laterality Date  . Appendectomy    . Breast surgery Bilateral implants and removal  . Spine surgery  L4-5    fusion and coil surgery  . Fracture surgery Right 2014    ankle    Home Medications:    Medication List       This list is accurate as of: 06/11/15  3:26 PM.  Always use your most recent med list.               ALPRAZolam 1 MG tablet  Commonly known as:  XANAX  TAKE 1 TABLET BY MOUTH THREE TIMES DAILY     FLUoxetine 20 MG capsule  Commonly known as:  PROZAC  Take 20 mg by mouth daily.     gabapentin 600 MG tablet  Commonly known as:  NEURONTIN     HYDROcodone-acetaminophen 5-325 MG per tablet  Commonly known as:  NORCO/VICODIN  Take 1 tablet by mouth every 6 (six) hours as needed for moderate pain.     lidocaine 5 %  Commonly known as:  LIDODERM  Place 1 patch onto the skin daily. Remove & Discard patch within 12 hours or as directed by MD     oxybutynin 10 MG 24 hr tablet  Commonly known as:  DITROPAN XL  Take 1 tablet (10 mg total) by mouth daily.     Oxycodone HCl 10 MG Tabs  Take  1 tablet (10 mg total) by mouth 4 (four) times daily as needed.     phenazopyridine 100 MG tablet  Commonly known as:  PYRIDIUM  Take 1 tablet by mouth as needed.     temazepam 15 MG capsule  Commonly known as:  RESTORIL        Allergies:  Allergies  Allergen Reactions  . Effexor [Venlafaxine]     euphoric    Family History: Family History  Problem Relation Age of Onset  . Cancer Mother     colon  . Diabetes Mother   . Coronary artery disease Mother     Social History:  reports that she quit smoking about 26 years ago. She has never used smokeless tobacco. She reports that she drinks alcohol. She reports that she does not use illicit drugs.  ROS: UROLOGY Frequent Urination?: No Hard to postpone urination?: No Burning/pain with urination?: No Get up at night to urinate?: Yes Leakage of urine?: No Urine stream starts and stops?: Yes Trouble  starting stream?: Yes Do you have to strain to urinate?: Yes Blood in urine?: No Urinary tract infection?: No Sexually transmitted disease?: No Injury to kidneys or bladder?: No Painful intercourse?: No Weak stream?: Yes Currently pregnant?: No Vaginal bleeding?: No Last menstrual period?: n  Gastrointestinal Nausea?: No Vomiting?: No Indigestion/heartburn?: No Diarrhea?: No Constipation?: Yes  Constitutional Fever: No Night sweats?: No Weight loss?: Yes Fatigue?: Yes  Skin Skin rash/lesions?: No Itching?: No  Eyes Blurred vision?: No Double vision?: No  Ears/Nose/Throat Sore throat?: No Sinus problems?: No  Hematologic/Lymphatic Swollen glands?: No Easy bruising?: No  Cardiovascular Leg swelling?: No Chest pain?: Yes  Respiratory Cough?: No Shortness of breath?: No  Endocrine Excessive thirst?: No  Musculoskeletal Back pain?: Yes Joint pain?: No  Neurological Headaches?: No Dizziness?: No  Psychologic Depression?: Yes Anxiety?: Yes  Physical Exam: BP 105/70 mmHg  Pulse 76  Resp 16  Ht 5\' 4"  (1.626 m)  Wt 171 lb 9.6 oz (77.837 kg)  BMI 29.44 kg/m2  GU:  Atrophic external genitalia.  Normal urethral meatus. No urethral masses and/or tenderness. No bladder fullness or masses. Bladder tenderness is noted.  No vaginal lesions or discharge. Normal rectal tone, no masses. Normal anus and perineum.   Laboratory Data: Urinalysis Results for orders placed or performed in visit on 06/11/15  Microscopic Examination  Result Value Ref Range   WBC, UA 0-5 0 -  5 /hpf   RBC, UA None seen 0 -  2 /hpf   Epithelial Cells (non renal) 0-10 0 - 10 /hpf   Crystals Present (A) N/A   Crystal Type Calcium Oxalate N/A   Mucus, UA Present (A) Not Estab.   Bacteria, UA None seen None seen/Few  Urinalysis, Complete  Result Value Ref Range   Specific Gravity, UA >1.030 (H) 1.005 - 1.030   pH, UA 5.0 5.0 - 7.5   Color, UA Yellow Yellow   Appearance Ur Clear  Clear   Leukocytes, UA Trace (A) Negative   Protein, UA Trace (A) Negative/Trace   Glucose, UA Negative Negative   Ketones, UA Trace (A) Negative   RBC, UA Negative Negative   Bilirubin, UA Positive (A) Negative   Urobilinogen, Ur 1.0 0.2 - 1.0 mg/dL   Nitrite, UA Negative Negative   Microscopic Examination See below:   BLADDER SCAN AMB NON-IMAGING  Result Value Ref Range   Scan Result 46ml     Assessment & Plan:    1. Bladder pain:  Patient  is finding some mild improvement with the oxybutynin. She was also found to have vaginal atrophy and started on vaginal estrogen cream. She is to return in 3 weeks for symptom recheck, PVR and exam.  If conservative management fails, will consider further work up with cystoscopy to access for Hunter's ulcers or other more aggressive treatments, however, response to each of these interventions is highly variable.   - BLADDER SCAN AMB NON-IMAGING - Urinalysis, Complete  2. Urinary frequency:  Patient did find some mild improvement with the oxybutynin, but her symptoms have not completely resolved. She will continue the oxybutynin and return in 3 weeks' time for symptom recheck and PVR.    3. Gross hematuria:   Patient reported 2 episodes of gross hematuria which she associated with infection. Her microscopic examinations with [place] have been negative. We will continue to monitor. We are recheck AUA when she returns in 3 weeks.  4. Atrophic vaginitis:   Patient was given a sample of vaginal estrogen cream and instructed to apply 0.5mg  (pea-sized amount)  just inside the vaginal introitus with a finger-tip every night for two weeks and then Monday, Wednesday and Friday nights.  I explained to the patient that vaginally administered estrogen, which causes only a slight increase in the blood estrogen levels, have fewer contraindications and adverse systemic effects that oral HT.  She will return in 3 weeks for reexamination of her vaginal mucosa and symptom  recheck  No Follow-up on file.  Zara Council, [facility] [address] [place], [place] [address] [phone]

## 2015-06-18 ENCOUNTER — Other Ambulatory Visit: Payer: Self-pay | Admitting: Family Medicine

## 2015-06-18 MED ORDER — HYDROCODONE-ACETAMINOPHEN 5-325 MG PO TABS: 1.0000 | ORAL_TABLET | Freq: Four times a day (QID) | ORAL | Status: DC | PRN

## 2015-06-18 MED ORDER — OXYCODONE HCL 10 MG PO TABS: 10.0000 mg | ORAL_TABLET | Freq: Four times a day (QID) | ORAL | Status: DC | PRN

## 2015-06-22 DIAGNOSIS — R310 Gross hematuria: Secondary | ICD-10-CM | POA: Insufficient documentation

## 2015-06-22 DIAGNOSIS — R350 Frequency of micturition: Secondary | ICD-10-CM | POA: Insufficient documentation

## 2015-06-22 DIAGNOSIS — N952 Postmenopausal atrophic vaginitis: Secondary | ICD-10-CM | POA: Insufficient documentation

## 2015-06-22 DIAGNOSIS — R3989 Other symptoms and signs involving the genitourinary system: Secondary | ICD-10-CM | POA: Insufficient documentation

## 2015-06-30 ENCOUNTER — Other Ambulatory Visit: Payer: Self-pay

## 2015-06-30 DIAGNOSIS — R350 Frequency of micturition: Secondary | ICD-10-CM

## 2015-06-30 MED ORDER — OXYBUTYNIN CHLORIDE ER 10 MG PO TB24
10.0000 mg | ORAL_TABLET | Freq: Every day | ORAL | Status: DC
Start: 2015-06-30 — End: 2015-07-29

## 2015-06-30 NOTE — Progress Notes (Signed)
Pt called stating she is wetting all over herself and needs more medication. Per Shannon's last note pt should be taking oxybutynin. Refill was given. Pt has an appt 07/14/15 with Larene Beach.

## 2015-07-03 ENCOUNTER — Other Ambulatory Visit: Payer: Self-pay | Admitting: Family Medicine

## 2015-07-13 ENCOUNTER — Ambulatory Visit: Payer: Medicare Other | Admitting: Urology

## 2015-07-13 ENCOUNTER — Ambulatory Visit (INDEPENDENT_AMBULATORY_CARE_PROVIDER_SITE_OTHER): Payer: Medicare Other | Admitting: Family Medicine

## 2015-07-13 ENCOUNTER — Encounter: Payer: Self-pay | Admitting: Family Medicine

## 2015-07-13 ENCOUNTER — Other Ambulatory Visit: Payer: Self-pay | Admitting: Family Medicine

## 2015-07-13 VITALS — BP 96/66 | HR 73 | Temp 98.6°F | Ht 65.0 in | Wt 171.4 lb

## 2015-07-13 DIAGNOSIS — F329 Major depressive disorder, single episode, unspecified: Secondary | ICD-10-CM | POA: Diagnosis not present

## 2015-07-13 DIAGNOSIS — M4326 Fusion of spine, lumbar region: Secondary | ICD-10-CM | POA: Diagnosis not present

## 2015-07-13 DIAGNOSIS — F32A Depression, unspecified: Secondary | ICD-10-CM

## 2015-07-13 MED ORDER — OXYCODONE HCL 10 MG PO TABS: 10.0000 mg | ORAL_TABLET | Freq: Four times a day (QID) | ORAL | Status: DC | PRN

## 2015-07-13 MED ORDER — HYDROCODONE-ACETAMINOPHEN 5-325 MG PO TABS: 1.0000 | ORAL_TABLET | Freq: Four times a day (QID) | ORAL | Status: DC | PRN

## 2015-07-13 NOTE — Assessment & Plan Note (Signed)
With chronic pain as a consequence is been on pain medications long-term in the process of transferring care to pain specialist, which patient hasn't made an appointment. Patient wants to get off pain medications altogether is exploring clinics for detox and stopping medications altogether. Discussed the end of the year is the last of our prescriptions for controlled drugs from this office. We will assist in any way we can.

## 2015-07-13 NOTE — Progress Notes (Signed)
BP 96/66 mmHg  Pulse 73  Temp(Src) 98.6 F (37 C)  Ht 5\' 5"  (1.651 m)  Wt 171 lb 6.4 oz (77.747 kg)  BMI 28.52 kg/m2  SpO2 99%   Subjective:    Patient ID: Catherine Hughes, female    DOB: 12-May-1951, 64 y.o.   MRN: [id number]  HPI: Catherine Hughes is a 64 y.o. female  Chief Complaint  Patient presents with  . Medication Refill  With chronic pain as a consequence is been on pain medications long-term in the process of transferring care to pain specialist, which patient hasn't made an appointment. Patient wants to get off pain medications altogether is exploring clinics for detox and stopping medications altogether. Discussed the end of the year is the last of our prescriptions for controlled drugs from this office. We will assist in any way we can.  Discussed depression patient fair at best Discussed psychiatrist could help further patient very reluctant to has had multiple attempts with psychiatrist which were unsatisfactory. Taking Prozac and Neurontin okay.    Relevant past medical, surgical, family and social history reviewed and updated as indicated. Interim medical history since our last visit reviewed. Allergies and medications reviewed and updated.  Review of Systems  Constitutional: Negative.   Respiratory: Negative.   Cardiovascular: Negative.     Per HPI unless specifically indicated above     Objective:    BP 96/66 mmHg  Pulse 73  Temp(Src) 98.6 F (37 C)  Ht 5\' 5"  (1.651 m)  Wt 171 lb 6.4 oz (77.747 kg)  BMI 28.52 kg/m2  SpO2 99%  Wt Readings from Last 3 Encounters:  07/13/15 171 lb 6.4 oz (77.747 kg)  06/11/15 171 lb 9.6 oz (77.837 kg)  05/14/15 176 lb 9.6 oz (80.105 kg)    Physical Exam  Constitutional: She is oriented to person, place, and time. She appears well-developed and well-nourished. No distress.  HENT:  Head: Normocephalic and atraumatic.  Right Ear: Hearing normal.  Left Ear: Hearing normal.  Nose: Nose normal.  Eyes:  Conjunctivae and lids are normal. Right eye exhibits no discharge. Left eye exhibits no discharge. No scleral icterus.  Cardiovascular: Normal rate, regular rhythm and normal heart sounds.   Pulmonary/Chest: Effort normal and breath sounds normal. No respiratory distress.  Musculoskeletal: Normal range of motion.  Neurological: She is alert and oriented to person, place, and time.  Skin: Skin is intact. No rash noted.  Psychiatric: She has a normal mood and affect. Her speech is normal and behavior is normal. Judgment and thought content normal. Cognition and memory are normal.    Results for orders placed or performed in visit on 06/11/15  Microscopic Examination  Result Value Ref Range   WBC, UA 0-5 0 -  5 /hpf   RBC, UA None seen 0 -  2 /hpf   Epithelial Cells (non renal) 0-10 0 - 10 /hpf   Crystals Present (A) N/A   Crystal Type Calcium Oxalate N/A   Mucus, UA Present (A) Not Estab.   Bacteria, UA None seen None seen/Few  Urinalysis, Complete  Result Value Ref Range   Specific Gravity, UA >1.030 (H) 1.005 - 1.030   pH, UA 5.0 5.0 - 7.5   Color, UA Yellow Yellow   Appearance Ur Clear Clear   Leukocytes, UA Trace (A) Negative   Protein, UA Trace (A) Negative/Trace   Glucose, UA Negative Negative   Ketones, UA Trace (A) Negative   RBC, UA Negative Negative  Bilirubin, UA Positive (A) Negative   Urobilinogen, Ur 1.0 0.2 - 1.0 mg/dL   Nitrite, UA Negative Negative   Microscopic Examination See below:   BLADDER SCAN AMB NON-IMAGING  Result Value Ref Range   Scan Result 25ml       Assessment & Plan:   Problem List Items Addressed This Visit      Musculoskeletal and Integument   Fusion of spine - Primary    With chronic pain as a consequence is been on pain medications long-term in the process of transferring care to pain specialist, which patient hasn't made an appointment. Patient wants to get off pain medications altogether is exploring clinics for detox and stopping  medications altogether. Discussed the end of the year is the last of our prescriptions for controlled drugs from this office. We will assist in any way we can.         Other   Depression    Stable cont meds          Follow up plan: Return in about 3 months (around 10/12/2015), or if symptoms worsen or fail to improve, for wrap up of pain meds.

## 2015-07-13 NOTE — Assessment & Plan Note (Signed)
Stable con't meds 

## 2015-07-14 ENCOUNTER — Ambulatory Visit: Payer: Medicare Other | Admitting: Urology

## 2015-07-14 ENCOUNTER — Telehealth: Payer: Self-pay | Admitting: *Deleted

## 2015-07-14 ENCOUNTER — Encounter: Payer: Self-pay | Admitting: Urology

## 2015-07-14 NOTE — Telephone Encounter (Signed)
LMOM on machine. Patient showed up an hour late for appointment today and was rescheduled, while at office patient asked for another sample of estrace cream. We were out and patient states she would like a rx. Per Larene Beach ok to call in compound rx to pharmacy. Called medication into [facility]. Trying to let patient know she has to go to [facility] on [address] to pick up med. Patient calling in to office while I was typing message. Patient informed of rx at [facility].

## 2015-07-16 ENCOUNTER — Ambulatory Visit (INDEPENDENT_AMBULATORY_CARE_PROVIDER_SITE_OTHER): Payer: Medicare Other | Admitting: Urology

## 2015-07-16 ENCOUNTER — Ambulatory Visit: Payer: Medicare Other | Admitting: Urology

## 2015-07-16 ENCOUNTER — Encounter: Payer: Self-pay | Admitting: Urology

## 2015-07-16 VITALS — BP 118/75 | HR 78 | Ht 65.0 in | Wt 175.4 lb

## 2015-07-16 DIAGNOSIS — R350 Frequency of micturition: Secondary | ICD-10-CM | POA: Diagnosis not present

## 2015-07-16 DIAGNOSIS — N8110 Cystocele, unspecified: Secondary | ICD-10-CM | POA: Diagnosis not present

## 2015-07-16 DIAGNOSIS — R3989 Other symptoms and signs involving the genitourinary system: Secondary | ICD-10-CM | POA: Diagnosis not present

## 2015-07-16 DIAGNOSIS — N393 Stress incontinence (female) (male): Secondary | ICD-10-CM | POA: Diagnosis not present

## 2015-07-16 DIAGNOSIS — R310 Gross hematuria: Secondary | ICD-10-CM

## 2015-07-16 DIAGNOSIS — IMO0002 Reserved for concepts with insufficient information to code with codable children: Secondary | ICD-10-CM

## 2015-07-16 DIAGNOSIS — N952 Postmenopausal atrophic vaginitis: Secondary | ICD-10-CM | POA: Diagnosis not present

## 2015-07-16 DIAGNOSIS — IMO0001 Reserved for inherently not codable concepts without codable children: Secondary | ICD-10-CM

## 2015-07-16 LAB — BLADDER SCAN AMB NON-IMAGING: SCAN RESULT: 0

## 2015-07-16 NOTE — Progress Notes (Signed)
07/16/2015 8:34 AM   Catherine Hughes 12/12/1950 [id number]  Referring provider: Guadalupe Maple, MD [address] [place], [place] [address]  Chief Complaint  Patient presents with  . Hematuria    3 week followup  . Vaginitis    HPI: Patient is a 64 year old white female with a history of bladder pain, urinary frequency, gross hematuria and atrophic vaginitis who presents today for a follow-up visit after continuing her oxybutynin and start a vaginal estrogen cream.  Patient states that with the continuation of the oxybutynin therapy and the addition of the vaginal estrogen cream, her bladder pain has abated.  She has not experienced any further gross hematuria. Her UA today was unremarkable. She is still experiencing a small stream of urine and having to reposition herself to finish voiding.   She is also experiencing stress urinary incontinence, losing urine when she laughs coughs and sneezes.  PMH: Past Medical History  Diagnosis Date  . Menopausal state   . Anxiety   . Depression   . Bipolar 1 disorder   . Fibromyalgia   . Arthritis   . Glaucoma   . Obesity (BMI 30.0-34.9)     Surgical History: Past Surgical History  Procedure Laterality Date  . Appendectomy    . Breast surgery Bilateral implants and removal  . Spine surgery  L4-5    fusion and coil surgery  . Fracture surgery Right 2014    ankle    Home Medications:    Medication List       This list is accurate as of: 07/16/15 11:59 PM.  Always use your most recent med list.               ALPRAZolam 1 MG tablet  Commonly known as:  XANAX  TAKE 1 TABLET BY MOUTH THREE TIMES DAILY     FLUoxetine 20 MG capsule  Commonly known as:  PROZAC  TAKE 1 CAPSULE BY MOUTH EVERY DAY     gabapentin 600 MG tablet  Commonly known as:  NEURONTIN     HYDROcodone-acetaminophen 5-325 MG tablet  Commonly known as:  NORCO/VICODIN  Take 1 tablet by mouth every 6 (six) hours as needed for moderate pain.     lidocaine 5 %  Commonly known as:  LIDODERM  Place 1 patch onto the skin daily. Remove & Discard patch within 12 hours or as directed by MD     oxybutynin 10 MG 24 hr tablet  Commonly known as:  DITROPAN XL  Take 1 tablet (10 mg total) by mouth daily.     Oxycodone HCl 10 MG Tabs  Take 1 tablet (10 mg total) by mouth 4 (four) times daily as needed.     phenazopyridine 100 MG tablet  Commonly known as:  PYRIDIUM  Take 1 tablet by mouth as needed.     temazepam 15 MG capsule  Commonly known as:  RESTORIL  TAKE 1 TO 2 CAPSULES BY MOUTH EVERY NIGHT AT BEDTIME        Allergies:  Allergies  Allergen Reactions  . Effexor [Venlafaxine]     euphoric    Family History: Family History  Problem Relation Age of Onset  . Cancer Mother     colon  . Diabetes Mother   . Coronary artery disease Mother   . Kidney disease Neg Hx     Social History:  reports that she quit smoking about 26 years ago. She has never used smokeless tobacco. She reports that she  drinks alcohol. She reports that she does not use illicit drugs.  ROS: UROLOGY Frequent Urination?: Yes Hard to postpone urination?: Yes Burning/pain with urination?: No Get up at night to urinate?: Yes Leakage of urine?: Yes Urine stream starts and stops?: Yes Trouble starting stream?: Yes Do you have to strain to urinate?: Yes Blood in urine?: No Urinary tract infection?: No Sexually transmitted disease?: No Injury to kidneys or bladder?: No Painful intercourse?: No Weak stream?: Yes Currently pregnant?: No Vaginal bleeding?: No Last menstrual period?: n  Gastrointestinal Nausea?: No Vomiting?: No Indigestion/heartburn?: Yes Diarrhea?: No Constipation?: Yes  Constitutional Fever: No Night sweats?: No Weight loss?: No Fatigue?: Yes  Skin Skin rash/lesions?: No Itching?: No  Eyes Blurred vision?: Yes Double vision?: No  Ears/Nose/Throat Sore throat?: No Sinus problems?:  No  Hematologic/Lymphatic Swollen glands?: No Easy bruising?: No  Cardiovascular Leg swelling?: Yes Chest pain?: No  Respiratory Cough?: No Shortness of breath?: No  Endocrine Excessive thirst?: Yes  Musculoskeletal Back pain?: Yes Joint pain?: Yes  Neurological Headaches?: Yes Dizziness?: No  Psychologic Depression?: Yes Anxiety?: Yes  Physical Exam: BP 118/75 mmHg  Pulse 78  Ht 5\' 5"  (1.651 m)  Wt 175 lb 6.4 oz (79.561 kg)  BMI 29.19 kg/m2  GU:  Atrophic external genitalia.  Normal urethral meatus. No urethral masses and/or tenderness. Urethral hypermobility was noted on exam, but no leakage demonstrated on Valsalva.   No bladder fullness or masses. Grade II cystocele was noted on exam.  No vaginal lesions or discharge. Normal rectal tone, no masses. Normal anus and perineum.    Laboratory Data: Lab Results  Component Value Date   WBC 9.7 01/18/2012   HGB 10.4* 01/18/2012   HCT 31.6* 01/18/2012   MCV 92 01/18/2012   PLT 203 01/18/2012    Lab Results  Component Value Date   CREATININE 0.83 01/18/2012    Urinalysis Results for orders placed or performed in visit on 07/16/15  Microscopic Examination  Result Value Ref Range   WBC, UA 0-5 0 -  5 /hpf   RBC, UA None seen 0 -  2 /hpf   Epithelial Cells (non renal) 0-10 0 - 10 /hpf   Renal Epithel, UA None seen None seen /hpf   Bacteria, UA None seen None seen/Few  Urinalysis, Complete  Result Value Ref Range   Specific Gravity, UA 1.015 1.005 - 1.030   pH, UA 7.5 5.0 - 7.5   Color, UA Yellow Yellow   Appearance Ur Clear Clear   Leukocytes, UA Negative Negative   Protein, UA Negative Negative/Trace   Glucose, UA Negative Negative   Ketones, UA Negative Negative   RBC, UA Trace (A) Negative   Bilirubin, UA Negative Negative   Urobilinogen, Ur 0.2 0.2 - 1.0 mg/dL   Nitrite, UA Negative Negative   Microscopic Examination See below:   BLADDER SCAN AMB NON-IMAGING  Result Value Ref Range   Scan  Result 0     Pertinent Imaging: Results for Catherine, Hughes (MRN [id number]) as of 07/19/2015 19:16  Ref. Range 07/16/2015 15:47  Scan Result Unknown 0    Assessment & Plan:    1. Bladder pain:   Patient's bladder pain has seemed to abated with the oxybutynin estrogen cream.  We will continue to monitor.   2. Urinary frequency:   Patient is pleased with the improvement in her urinary frequency.   She would like to continue the oxybutynin.  3. Stress urinary incontinence:   Patient is having episodes of  leakage when she laughs, coughs and sneezes.  She is also having to reposition herself to finish voiding and voiding with a small stream.  This may be due to her cystocele and she would like further evaluation for her stress incontinence. She does not desire a pessary this time.  - Urinalysis, Complete - BLADDER SCAN AMB NON-IMAGING  4. Cystocele:   I discussed with patient pessary fitting and she was not interested in that treatment modality at this time.  She has memory issues and may be a poor candidate for physical therapy.   She would like to see Dr. Matilde Sprang to discuss treatment options.  5. Gross hematuria:   Patient had gross hematuria with infection.  She has not complained of recent hematuria or demonstrated microscopic hematuria on exam's.   6. Atrophic vaginitis:   Patient will continue to apply the vaginal estrogen cream Monday, Wednesday and Friday nights.  A sample of the vaginal estrogen cream is given to the patient.  Return for appointment with Dr. Matilde Sprang for stress urinary  incontinence.  Zara Council, [facility] [address], [place], [place] [address] [phone]

## 2015-07-17 ENCOUNTER — Other Ambulatory Visit: Payer: Self-pay | Admitting: Family Medicine

## 2015-07-19 DIAGNOSIS — IMO0001 Reserved for inherently not codable concepts without codable children: Secondary | ICD-10-CM | POA: Insufficient documentation

## 2015-07-19 DIAGNOSIS — N393 Stress incontinence (female) (male): Secondary | ICD-10-CM | POA: Insufficient documentation

## 2015-07-19 DIAGNOSIS — IMO0002 Reserved for concepts with insufficient information to code with codable children: Secondary | ICD-10-CM

## 2015-07-20 LAB — MICROSCOPIC EXAMINATION
BACTERIA UA: NONE SEEN
RBC, UA: NONE SEEN /hpf (ref 0–?)
RENAL EPITHEL UA: NONE SEEN /HPF

## 2015-07-20 LAB — URINALYSIS, COMPLETE
BILIRUBIN UA: NEGATIVE
Glucose, UA: NEGATIVE
Ketones, UA: NEGATIVE
LEUKOCYTES UA: NEGATIVE
Nitrite, UA: NEGATIVE
PH UA: 7.5 (ref 5.0–7.5)
PROTEIN UA: NEGATIVE
Specific Gravity, UA: 1.015 (ref 1.005–1.030)
Urobilinogen, Ur: 0.2 mg/dL (ref 0.2–1.0)

## 2015-07-29 ENCOUNTER — Ambulatory Visit (INDEPENDENT_AMBULATORY_CARE_PROVIDER_SITE_OTHER): Payer: Medicare Other | Admitting: Urology

## 2015-07-29 ENCOUNTER — Encounter: Payer: Self-pay | Admitting: Urology

## 2015-07-29 VITALS — BP 124/77 | HR 76 | Ht 65.0 in | Wt 168.8 lb

## 2015-07-29 DIAGNOSIS — R109 Unspecified abdominal pain: Secondary | ICD-10-CM | POA: Insufficient documentation

## 2015-07-29 DIAGNOSIS — R1084 Generalized abdominal pain: Secondary | ICD-10-CM | POA: Diagnosis not present

## 2015-07-29 DIAGNOSIS — N393 Stress incontinence (female) (male): Secondary | ICD-10-CM

## 2015-07-29 LAB — URINALYSIS, COMPLETE
Bilirubin, UA: NEGATIVE
GLUCOSE, UA: NEGATIVE
KETONES UA: NEGATIVE
Leukocytes, UA: NEGATIVE
NITRITE UA: NEGATIVE
Protein, UA: NEGATIVE
RBC, UA: NEGATIVE
Specific Gravity, UA: >1.03 — ABNORMAL HIGH (ref 1.005–1.030)
UUROB: 0.2 mg/dL (ref 0.2–1.0)
pH, UA: 5.5 (ref 5.0–7.5)

## 2015-07-29 LAB — MICROSCOPIC EXAMINATION
Renal Epithel, UA: NONE SEEN /hpf
WBC UA: NONE SEEN /HPF (ref 0–?)

## 2015-07-29 MED ORDER — OXYBUTYNIN CHLORIDE ER 10 MG PO TB24: 10.0000 mg | ORAL_TABLET | Freq: Every day | ORAL | Status: DC

## 2015-07-29 NOTE — Progress Notes (Signed)
07/29/2015 2:55 PM   Catherine Hughes September 16, 1951 [id number]  Referring Inna Tisdell: Guadalupe Maple, MD [address] [place], [place] [address]  Chief Complaint  Patient presents with  . Urinary Incontinence    follow up    HPI: The patient is a 64 year old woman referred by [facility] with urinary incontinence. She is mixed stress urge incontinence 70% improved on oxybutynin. She stopped Estrace a few days ago and does not think it helped a lot  She is most bothered by her leaking with coughing and sneezing but the amount is variable. She sometimes leaks with any lifting. She sometimes has urge incontinence now on oxybutynin. She is to get up 3-4 times a night but now it's once. She voids every to 3 hours during the day.  She denies enuresis except on 2 rare occasions  She takes oxycodone for chronic leg and back pain as had lower back surgery. She's not had a hysterectomy. She's not had a stroke. She had not had previous GU surgery or urinary tract infections.  It was documented that her bladder was painful though she said is completely gone on oxybutynin  There is no other modifying factors are associated signs or symptoms. There is no other aggravating or relieving factors. The presentation is moderate severity in ongoing   PMH: Past Medical History  Diagnosis Date  . Menopausal state   . Anxiety   . Depression   . Bipolar 1 disorder ([place])   . Fibromyalgia   . Arthritis   . Glaucoma   . Obesity (BMI 30.0-34.9)     Surgical History: Past Surgical History  Procedure Laterality Date  . Appendectomy    . Breast surgery Bilateral implants and removal  . Spine surgery  L4-5    fusion and coil surgery  . Fracture surgery Right 2014    ankle    Home Medications:    Medication List       This list is accurate as of: 07/29/15  2:55 PM.  Always use your most recent med list.               ALPRAZolam 1 MG tablet  Commonly known as:  XANAX  TAKE 1 TABLET BY MOUTH  THREE TIMES DAILY     FLUoxetine 20 MG capsule  Commonly known as:  PROZAC  TAKE 1 CAPSULE BY MOUTH EVERY DAY     gabapentin 600 MG tablet  Commonly known as:  NEURONTIN     HYDROcodone-acetaminophen 5-325 MG tablet  Commonly known as:  NORCO/VICODIN  Take 1 tablet by mouth every 6 (six) hours as needed for moderate pain.     lidocaine 5 %  Commonly known as:  LIDODERM  Place 1 patch onto the skin daily. Remove & Discard patch within 12 hours or as directed by MD     oxybutynin 10 MG 24 hr tablet  Commonly known as:  DITROPAN XL  Take 1 tablet (10 mg total) by mouth daily.     Oxycodone HCl 10 MG Tabs  Take 1 tablet (10 mg total) by mouth 4 (four) times daily as needed.     temazepam 15 MG capsule  Commonly known as:  RESTORIL  TAKE 1 TO 2 CAPSULES BY MOUTH EVERY NIGHT AT BEDTIME        Allergies: No Known Allergies  Family History: Family History  Problem Relation Age of Onset  . Cancer Mother     colon  . Diabetes Mother   .  Coronary artery disease Mother   . Kidney disease Neg Hx     Social History:  reports that she quit smoking about 26 years ago. She has never used smokeless tobacco. She reports that she drinks alcohol. She reports that she does not use illicit drugs.  ROS: UROLOGY Frequent Urination?: No Hard to postpone urination?: No Burning/pain with urination?: No Get up at night to urinate?: Yes Leakage of urine?: Yes Urine stream starts and stops?: Yes Trouble starting stream?: Yes Do you have to strain to urinate?: Yes Blood in urine?: No Urinary tract infection?: No Sexually transmitted disease?: No Injury to kidneys or bladder?: No Painful intercourse?: No Weak stream?: Yes Currently pregnant?: No Vaginal bleeding?: No Last menstrual period?: n  Gastrointestinal Nausea?: No Vomiting?: No Indigestion/heartburn?: Yes Diarrhea?: No Constipation?: Yes  Constitutional Fever: No Night sweats?: No Weight loss?: No Fatigue?:  No  Skin Skin rash/lesions?: No Itching?: No  Eyes Blurred vision?: Yes Double vision?: No  Ears/Nose/Throat Sore throat?: No Sinus problems?: No  Hematologic/Lymphatic Swollen glands?: No Easy bruising?: No  Cardiovascular Leg swelling?: No Chest pain?: No  Respiratory Cough?: No Shortness of breath?: No  Endocrine Excessive thirst?: No  Musculoskeletal Back pain?: Yes Joint pain?: Yes  Neurological Headaches?: No Dizziness?: No  Psychologic Depression?: Yes Anxiety?: Yes  Physical Exam: BP 124/77 mmHg  Pulse 76  Ht 5\' 5"  (1.651 m)  Wt 76.567 kg (168 lb 12.8 oz)  BMI 28.09 kg/m2  Constitutional:  Alert and oriented, No acute distress. HEENT: [place] AT, moist mucus membranes.  Trachea midline, no masses. Cardiovascular: No clubbing, cyanosis, or edema. Respiratory: Normal respiratory effort, no increased work of breathing. GI: Abdomen is soft, nontender, nondistended, no abdominal masses GU: No CVA tenderness. Grade 2 hyper mobility of the bladder neck associated with no stress incontinence. Very small grade 2 cystocele asymptomatic. No rectocele Skin: No rashes, bruises or suspicious lesions. Lymph: No cervical or inguinal adenopathy. Neurologic: Grossly intact, no focal deficits, moving all 4 extremities. Psychiatric: Normal mood and affect.  Laboratory Data: Lab Results  Component Value Date   WBC 9.7 01/18/2012   HGB 10.4* 01/18/2012   HCT 31.6* 01/18/2012   MCV 92 01/18/2012   PLT 203 01/18/2012    Lab Results  Component Value Date   CREATININE 0.83 01/18/2012    Urinalysis    Component Value Date/Time   COLORURINE AMBER* 05/03/2015 2205   COLORURINE Yellow 01/16/2012 1436   APPEARANCEUR CLEAR* 05/03/2015 2205   APPEARANCEUR Hazy 01/16/2012 1436   LABSPEC 1.016 05/03/2015 2205   LABSPEC 1.014 01/16/2012 1436   PHURINE 7.0 05/03/2015 2205   PHURINE 5.0 01/16/2012 1436   GLUCOSEU Negative 07/16/2015 1539   GLUCOSEU Negative  01/16/2012 1436   HGBUR NEGATIVE 05/03/2015 2205   HGBUR Negative 01/16/2012 1436   BILIRUBINUR Negative 07/16/2015 1539   BILIRUBINUR NEGATIVE 05/03/2015 2205   BILIRUBINUR Negative 01/16/2012 1436   KETONESUR NEGATIVE 05/03/2015 2205   KETONESUR Negative 01/16/2012 1436   PROTEINUR NEGATIVE 05/03/2015 2205   PROTEINUR Negative 01/16/2012 1436   NITRITE Negative 07/16/2015 1539   NITRITE NEGATIVE 05/03/2015 2205   NITRITE Negative 01/16/2012 1436   LEUKOCYTESUR Negative 07/16/2015 1539   LEUKOCYTESUR TRACE* 05/03/2015 2205   LEUKOCYTESUR Negative 01/16/2012 1436    Pertinent Imaging: None  Cystoscopy: After verbal consent sterile cystoscopy was performed. The bladder mucosa and trigone were normal. There is no stitch or foreign body or carcinoma. Urethra was normal. She tolerated procedure very well  Assessment & Plan:  The patient has mixed stress urge incontinence. On oxybutynin the stress component appears to be more significant. She used to have moderate severe nocturia which is much milder now. She is no longer having abdominal pain but takes oxycodone chronically for low back pain and leg pain. Urodynamics and the role cystoscopy was discussed.  With further questioning the patient was having a lot of foot on the floor syndrome and urge incontinence prior to oxybutynin. She was wearing pads but is now pad free. From a quality of life standpoint I think she is doing well and all think surgery would be in her best interest at this stage. Her son was here today since the patient tells me she has some memory issues. Oxybutynin renewed with 90 tablets and 3 refills. Reassess durability in 4 months. I could not justify recommending urodynamics and surgery with her current degree of stress incontinence  1. SUI (stress urinary incontinence, female) 2. Abdominal pain 3. Nocturia 4. Urge incontinence  - Urinalysis, Complete   No Follow-up on file.  Reece Packer,  MD  [facility] [address] [place], [place] [address] [phone]

## 2015-08-03 ENCOUNTER — Other Ambulatory Visit: Payer: Self-pay | Admitting: Family Medicine

## 2015-08-03 ENCOUNTER — Telehealth: Payer: Self-pay | Admitting: Family Medicine

## 2015-08-03 MED ORDER — HYDROCODONE-ACETAMINOPHEN 5-325 MG PO TABS: 1.0000 | ORAL_TABLET | Freq: Four times a day (QID) | ORAL | Status: DC | PRN

## 2015-08-03 MED ORDER — OXYCODONE HCL 10 MG PO TABS: 10.0000 mg | ORAL_TABLET | Freq: Four times a day (QID) | ORAL | Status: DC | PRN

## 2015-08-04 ENCOUNTER — Other Ambulatory Visit: Payer: Self-pay | Admitting: Family Medicine

## 2015-08-04 MED ORDER — ALPRAZOLAM 1 MG PO TABS: 1.0000 mg | ORAL_TABLET | Freq: Three times a day (TID) | ORAL | Status: DC

## 2015-08-24 ENCOUNTER — Telehealth: Payer: Self-pay | Admitting: Urology

## 2015-08-24 DIAGNOSIS — N3281 Overactive bladder: Secondary | ICD-10-CM

## 2015-08-24 MED ORDER — OXYBUTYNIN CHLORIDE ER 10 MG PO TB24: 10.0000 mg | ORAL_TABLET | Freq: Every day | ORAL | Status: DC

## 2015-08-24 NOTE — Telephone Encounter (Signed)
Pt contacted pharmacy, [facility], regarding Oxybutynin 10 mg 24 hour tablet Rx that was filled in September, she never picked it up.  Pharmacy told pt to contact doctor b/c it's been a long time and we would have to reissue it.  Please call [phone].  Pt wants to know if this can be called in to [facility] instead and not [facility].

## 2015-08-24 NOTE — Telephone Encounter (Signed)
Spoke with pt in reference oxybutynin. [facility]. Pt voiced understanding.

## 2015-08-24 NOTE — Telephone Encounter (Signed)
Patient has a list of medications and would like to go over them with a nurse.  Please call her as she is not sure what she should be taking.

## 2015-08-25 NOTE — Telephone Encounter (Signed)
Spoke with pt and reviewed all medications. Estradiol called into [facility] for pt, per [facility].

## 2015-09-01 ENCOUNTER — Other Ambulatory Visit: Payer: Self-pay | Admitting: Family Medicine

## 2015-09-01 MED ORDER — OXYCODONE HCL 10 MG PO TABS: 10.0000 mg | ORAL_TABLET | Freq: Four times a day (QID) | ORAL | Status: DC | PRN

## 2015-09-01 MED ORDER — HYDROCODONE-ACETAMINOPHEN 5-325 MG PO TABS: 1.0000 | ORAL_TABLET | Freq: Four times a day (QID) | ORAL | Status: DC | PRN

## 2015-09-02 ENCOUNTER — Other Ambulatory Visit: Payer: Self-pay | Admitting: Family Medicine

## 2015-09-02 MED ORDER — OXYCODONE HCL 10 MG PO TABS: 10.0000 mg | ORAL_TABLET | Freq: Four times a day (QID) | ORAL | Status: DC | PRN

## 2015-09-02 MED ORDER — HYDROCODONE-ACETAMINOPHEN 5-325 MG PO TABS: 1.0000 | ORAL_TABLET | Freq: Four times a day (QID) | ORAL | Status: DC | PRN

## 2015-09-07 ENCOUNTER — Ambulatory Visit: Payer: Self-pay

## 2015-09-09 ENCOUNTER — Telehealth: Payer: Self-pay

## 2015-09-09 ENCOUNTER — Other Ambulatory Visit: Payer: Self-pay | Admitting: Family Medicine

## 2015-09-09 DIAGNOSIS — M549 Dorsalgia, unspecified: Principal | ICD-10-CM

## 2015-09-09 DIAGNOSIS — G8929 Other chronic pain: Secondary | ICD-10-CM

## 2015-09-09 NOTE — Telephone Encounter (Signed)
Patient states she needs a referral to a pain clinic

## 2015-09-25 ENCOUNTER — Ambulatory Visit (INDEPENDENT_AMBULATORY_CARE_PROVIDER_SITE_OTHER): Payer: Medicare Other | Admitting: Licensed Clinical Social Worker

## 2015-09-25 DIAGNOSIS — F39 Unspecified mood [affective] disorder: Secondary | ICD-10-CM
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
## 2015-09-26 ENCOUNTER — Other Ambulatory Visit: Payer: Self-pay | Admitting: Family Medicine

## 2015-09-29 ENCOUNTER — Other Ambulatory Visit: Payer: Self-pay | Admitting: Family Medicine

## 2015-09-29 MED ORDER — OXYCODONE HCL 10 MG PO TABS: 10.0000 mg | ORAL_TABLET | Freq: Four times a day (QID) | ORAL | Status: DC | PRN

## 2015-09-29 MED ORDER — HYDROCODONE-ACETAMINOPHEN 5-325 MG PO TABS: 1.0000 | ORAL_TABLET | Freq: Four times a day (QID) | ORAL | Status: DC | PRN

## 2015-10-02 ENCOUNTER — Other Ambulatory Visit: Payer: Self-pay | Admitting: Family Medicine

## 2015-10-02 NOTE — Telephone Encounter (Signed)
Pt would like to know when if she had to wait until she was seen to get her referral.

## 2015-10-05 ENCOUNTER — Other Ambulatory Visit: Payer: Self-pay | Admitting: Family Medicine

## 2015-10-05 DIAGNOSIS — G8929 Other chronic pain: Secondary | ICD-10-CM

## 2015-10-05 DIAGNOSIS — M549 Dorsalgia, unspecified: Principal | ICD-10-CM

## 2015-10-05 NOTE — Telephone Encounter (Signed)
It looks like you need to put in referral for pain management, does she need to be seen first?

## 2015-10-08 ENCOUNTER — Ambulatory Visit (INDEPENDENT_AMBULATORY_CARE_PROVIDER_SITE_OTHER): Payer: Medicare Other | Admitting: Family Medicine

## 2015-10-08 ENCOUNTER — Encounter: Payer: Self-pay | Admitting: Family Medicine

## 2015-10-08 VITALS — BP 122/84 | HR 96 | Temp 99.0°F | Ht 63.3 in | Wt 164.0 lb

## 2015-10-08 DIAGNOSIS — F32A Depression, unspecified: Secondary | ICD-10-CM

## 2015-10-08 DIAGNOSIS — Z1211 Encounter for screening for malignant neoplasm of colon: Secondary | ICD-10-CM

## 2015-10-08 DIAGNOSIS — M4326 Fusion of spine, lumbar region: Secondary | ICD-10-CM

## 2015-10-08 DIAGNOSIS — F329 Major depressive disorder, single episode, unspecified: Secondary | ICD-10-CM

## 2015-10-08 DIAGNOSIS — Z1239 Encounter for other screening for malignant neoplasm of breast: Secondary | ICD-10-CM

## 2015-10-08 MED ORDER — ALPRAZOLAM 1 MG PO TABS: 1.0000 mg | ORAL_TABLET | Freq: Three times a day (TID) | ORAL | Status: DC

## 2015-10-08 NOTE — Assessment & Plan Note (Addendum)
Depression anxiety nerves worsening has tried to cut back Xanax to 3 a day patient unable Have referral to psychiatry is pending waiting to hear back from psychiatry. Requesting psychiatry to manage patient's anxiety and depression medication. will increase Xanax to 4 a day Strongly cautioned patient about taking Xanax Restoril and narcotics together.

## 2015-10-08 NOTE — Progress Notes (Signed)
BP 122/84 mmHg  Pulse 96  Temp(Src) 99 F (37.2 C)  Ht 5' 3.3" (1.608 m)  Wt 164 lb (74.39 kg)  BMI 28.77 kg/m2  SpO2 97%   Subjective:    Patient ID: Catherine Hughes, female    DOB: [birth year], 64 y.o.   MRN: [id number]  HPI: Catherine Hughes is a 63 y.o. female  Chief Complaint  Patient presents with  . Pain    Patient would like a referral to the pain clinic  . Nevus    back of left leg   patient with chronic back pain largely resolved and controlled but patient unable to get off narcotics tried by her self and turned into a beast waiting on referral to pain clinic to further manage patient's pain and assist in finding the lowest dose that takes care of the patient.  Patient also has tried to reduce Xanax had been taking 4 a day was reduced to 3 a day and it's not enough patient having a lot of anxiety symptoms. Patient has appointment with psychiatry to manage anxiety depression. We are waiting to hear from the psychiatry consult.  will increase Xanax to 4 a day Strongly cautioned patient about taking Xanax Restoril and narcotics together.  Relevant past medical, surgical, family and social history reviewed and updated as indicated. Interim medical history since our last visit reviewed. Allergies and medications reviewed and updated.  Review of Systems  Constitutional: Negative.   Respiratory: Negative.   Cardiovascular: Negative.     Per HPI unless specifically indicated above     Objective:    BP 122/84 mmHg  Pulse 96  Temp(Src) 99 F (37.2 C)  Ht 5' 3.3" (1.608 m)  Wt 164 lb (74.39 kg)  BMI 28.77 kg/m2  SpO2 97%  Wt Readings from Last 3 Encounters:  10/08/15 164 lb (74.39 kg)  07/29/15 168 lb 12.8 oz (76.567 kg)  07/16/15 175 lb 6.4 oz (79.561 kg)    Physical Exam  Constitutional: She is oriented to person, place, and time. She appears well-developed and well-nourished. No distress.  HENT:  Head: Normocephalic and atraumatic.  Right Ear: Hearing  normal.  Left Ear: Hearing normal.  Nose: Nose normal.  Eyes: Conjunctivae and lids are normal. Right eye exhibits no discharge. Left eye exhibits no discharge. No scleral icterus.  Cardiovascular: Normal rate, regular rhythm and normal heart sounds.   Pulmonary/Chest: Effort normal and breath sounds normal. No respiratory distress.  Musculoskeletal: Normal range of motion.  Neurological: She is alert and oriented to person, place, and time.  Skin: Skin is intact. No rash noted.  Benign seborrheic keratosis behind left leg on calf  Psychiatric: She has a normal mood and affect. Her speech is normal and behavior is normal. Judgment and thought content normal. Cognition and memory are normal.    Results for orders placed or performed in visit on 07/29/15  Microscopic Examination  Result Value Ref Range   WBC, UA None seen 0 -  5 /hpf   RBC, UA 0-2 0 -  2 /hpf   Epithelial Cells (non renal) >10 (A) 0 - 10 /hpf   Renal Epithel, UA None seen None seen /hpf   Bacteria, UA Many (A) None seen/Few  Urinalysis, Complete  Result Value Ref Range   Specific Gravity, UA >1.030 (H) 1.005 - 1.030   pH, UA 5.5 5.0 - 7.5   Color, UA Yellow Yellow   Appearance Ur Clear Clear   Leukocytes, UA Negative Negative  Protein, UA Negative Negative/Trace   Glucose, UA Negative Negative   Ketones, UA Negative Negative   RBC, UA Negative Negative   Bilirubin, UA Negative Negative   Urobilinogen, Ur 0.2 0.2 - 1.0 mg/dL   Nitrite, UA Negative Negative   Microscopic Examination See below:       Assessment & Plan:   Problem List Items Addressed This Visit      Musculoskeletal and Integument   Fusion of spine    Chronic pain on narcotics wants to try to get off but unable      Relevant Orders   Ambulatory referral to Pain Clinic     Other   Depression    Depression anxiety nerves worsening has tried to cut back Xanax to 3 a day patient unable Have referral to psychiatry is pending waiting to hear  back from psychiatry. Requesting psychiatry to manage patient's anxiety and depression medication. will increase Xanax to 4 a day Strongly cautioned patient about taking Xanax Restoril and narcotics together.      Relevant Medications   ALPRAZolam (XANAX) 1 MG tablet    Other Visit Diagnoses    Breast cancer screening    -  Primary    Relevant Orders    MM DIGITAL SCREENING BILATERAL    Colon cancer screening        Relevant Orders    Ambulatory referral to General Surgery        Follow up plan: Return in about 3 months (around 01/06/2016) for Medicine check patient getting physical and GYN.

## 2015-10-08 NOTE — Assessment & Plan Note (Signed)
Chronic pain on narcotics wants to try to get off but unable

## 2015-10-09 ENCOUNTER — Telehealth: Payer: Self-pay

## 2015-10-09 MED ORDER — ALPRAZOLAM 1 MG PO TABS: 1.0000 mg | ORAL_TABLET | Freq: Four times a day (QID) | ORAL | Status: DC

## 2015-10-09 NOTE — Telephone Encounter (Signed)
Pharmacy needs you to change Rx of Alprazolam 1mg  to QID to put through her insurance, Rx written yesterday was for TID It is in your note to increase

## 2015-10-21 ENCOUNTER — Other Ambulatory Visit: Payer: Self-pay | Admitting: Family Medicine

## 2015-10-21 MED ORDER — OXYCODONE HCL 10 MG PO TABS: 10.0000 mg | ORAL_TABLET | Freq: Four times a day (QID) | ORAL | Status: DC | PRN

## 2015-10-21 MED ORDER — HYDROCODONE-ACETAMINOPHEN 5-325 MG PO TABS: 1.0000 | ORAL_TABLET | Freq: Four times a day (QID) | ORAL | Status: DC | PRN

## 2015-10-22 ENCOUNTER — Ambulatory Visit: Payer: Medicare Other | Admitting: Psychiatry

## 2015-10-29 ENCOUNTER — Telehealth: Payer: Self-pay | Admitting: Gastroenterology

## 2015-10-29 NOTE — Telephone Encounter (Signed)
Please call patient for colonoscopy triage, thanks

## 2015-11-01 ENCOUNTER — Other Ambulatory Visit: Payer: Self-pay | Admitting: Family Medicine

## 2015-11-01 NOTE — Telephone Encounter (Signed)
Usual prescriber, Dr. Golden Pop, is out of the office and I am covering this patient (I have alphabet letters J through R) [place] reviewed; it is Sunday evening; will address this request on Monday in the office

## 2015-11-02 NOTE — Telephone Encounter (Signed)
I reviewed med list with colleague I'm not comfortable refilling this I called patient to explain I explained that mixing the benzodiazepines and the narcotics increases her risk of fatal overdose She says that she was not aware of the risk of potential overdose of mixing benzodiazepines and her pain medicines She says she has been taking this sleeping pill for 20 years and this is the first time she's had a problem getting it I encouraged her to talk with her usual provider upon his return to discuss other non-pharmacologic options for sleep I will not refill the temazepam

## 2015-11-09 ENCOUNTER — Other Ambulatory Visit: Payer: Self-pay | Admitting: Family Medicine

## 2015-11-09 NOTE — Telephone Encounter (Signed)
This was addressed Jan 15th and 16th; I'm not going to refill this; see previous note

## 2015-11-09 NOTE — Telephone Encounter (Signed)
Pt has requested we wait until March 2017 to schedule. Sent postdated message to remind to call pt in March.

## 2015-11-13 ENCOUNTER — Other Ambulatory Visit: Payer: Self-pay | Admitting: Family Medicine

## 2015-11-16 NOTE — Telephone Encounter (Signed)
rx to call in 

## 2015-11-16 NOTE — Telephone Encounter (Signed)
Call pt 

## 2015-11-16 NOTE — Telephone Encounter (Signed)
Pt called to inquire about the status of her refill request. Please contact pt ASAP. Thanks.

## 2015-11-23 ENCOUNTER — Other Ambulatory Visit: Payer: Self-pay | Admitting: Family Medicine

## 2015-11-23 ENCOUNTER — Telehealth: Payer: Self-pay

## 2015-11-23 MED ORDER — OXYCODONE HCL 10 MG PO TABS: 10.0000 mg | ORAL_TABLET | Freq: Every day | ORAL | Status: DC

## 2015-11-23 NOTE — Telephone Encounter (Signed)
Phone call  Patient has appointment at the pain clinic will go ahead with bridge prescription for pain

## 2015-11-25 ENCOUNTER — Telehealth: Payer: Self-pay

## 2015-11-25 ENCOUNTER — Other Ambulatory Visit: Payer: Self-pay | Admitting: Family Medicine

## 2015-11-25 MED ORDER — OXYCODONE HCL 10 MG PO TABS: 10.0000 mg | ORAL_TABLET | Freq: Every day | ORAL | Status: DC

## 2015-11-25 NOTE — Telephone Encounter (Signed)
She is needing more Oxycodone, looks like her appointment with Pain Clinic is on 2/15 and the bridge Rx you gave her was only good for 4 days.  Verify I am seeing this correctly

## 2015-12-01 ENCOUNTER — Ambulatory Visit: Payer: Self-pay

## 2015-12-02 ENCOUNTER — Other Ambulatory Visit: Payer: Self-pay | Admitting: Pain Medicine

## 2015-12-02 ENCOUNTER — Encounter (INDEPENDENT_AMBULATORY_CARE_PROVIDER_SITE_OTHER): Payer: Self-pay

## 2015-12-02 ENCOUNTER — Encounter: Payer: Self-pay | Admitting: Pain Medicine

## 2015-12-02 ENCOUNTER — Ambulatory Visit: Payer: Self-pay | Admitting: Pain Medicine

## 2015-12-02 ENCOUNTER — Ambulatory Visit: Payer: Medicare Other | Attending: Pain Medicine | Admitting: Pain Medicine

## 2015-12-02 VITALS — BP 127/69 | HR 69 | Temp 99.2°F | Resp 16 | Ht 64.0 in | Wt 167.0 lb

## 2015-12-02 DIAGNOSIS — M961 Postlaminectomy syndrome, not elsewhere classified: Secondary | ICD-10-CM

## 2015-12-02 DIAGNOSIS — M47816 Spondylosis without myelopathy or radiculopathy, lumbar region: Secondary | ICD-10-CM

## 2015-12-02 DIAGNOSIS — G4730 Sleep apnea, unspecified: Secondary | ICD-10-CM | POA: Insufficient documentation

## 2015-12-02 DIAGNOSIS — Z87898 Personal history of other specified conditions: Secondary | ICD-10-CM

## 2015-12-02 DIAGNOSIS — M545 Low back pain, unspecified: Secondary | ICD-10-CM | POA: Insufficient documentation

## 2015-12-02 DIAGNOSIS — Z87891 Personal history of nicotine dependence: Secondary | ICD-10-CM | POA: Insufficient documentation

## 2015-12-02 DIAGNOSIS — R3989 Other symptoms and signs involving the genitourinary system: Secondary | ICD-10-CM | POA: Insufficient documentation

## 2015-12-02 DIAGNOSIS — Z79891 Long term (current) use of opiate analgesic: Secondary | ICD-10-CM | POA: Insufficient documentation

## 2015-12-02 DIAGNOSIS — F329 Major depressive disorder, single episode, unspecified: Secondary | ICD-10-CM | POA: Insufficient documentation

## 2015-12-02 DIAGNOSIS — N8110 Cystocele, unspecified: Secondary | ICD-10-CM | POA: Insufficient documentation

## 2015-12-02 DIAGNOSIS — Z5181 Encounter for therapeutic drug level monitoring: Secondary | ICD-10-CM | POA: Diagnosis not present

## 2015-12-02 DIAGNOSIS — Z8659 Personal history of other mental and behavioral disorders: Secondary | ICD-10-CM

## 2015-12-02 DIAGNOSIS — N952 Postmenopausal atrophic vaginitis: Secondary | ICD-10-CM | POA: Diagnosis not present

## 2015-12-02 DIAGNOSIS — M549 Dorsalgia, unspecified: Secondary | ICD-10-CM | POA: Diagnosis present

## 2015-12-02 DIAGNOSIS — G8929 Other chronic pain: Secondary | ICD-10-CM | POA: Diagnosis not present

## 2015-12-02 DIAGNOSIS — F411 Generalized anxiety disorder: Secondary | ICD-10-CM | POA: Diagnosis not present

## 2015-12-02 DIAGNOSIS — M79606 Pain in leg, unspecified: Secondary | ICD-10-CM | POA: Diagnosis not present

## 2015-12-02 DIAGNOSIS — M4327 Fusion of spine, lumbosacral region: Secondary | ICD-10-CM

## 2015-12-02 DIAGNOSIS — N393 Stress incontinence (female) (male): Secondary | ICD-10-CM | POA: Insufficient documentation

## 2015-12-02 DIAGNOSIS — G894 Chronic pain syndrome: Secondary | ICD-10-CM | POA: Diagnosis not present

## 2015-12-02 DIAGNOSIS — Z79899 Other long term (current) drug therapy: Secondary | ICD-10-CM | POA: Diagnosis not present

## 2015-12-02 DIAGNOSIS — F11959 Opioid use, unspecified with opioid-induced psychotic disorder, unspecified: Secondary | ICD-10-CM | POA: Diagnosis not present

## 2015-12-02 DIAGNOSIS — R51 Headache: Secondary | ICD-10-CM | POA: Diagnosis not present

## 2015-12-02 DIAGNOSIS — K5903 Drug induced constipation: Secondary | ICD-10-CM | POA: Diagnosis not present

## 2015-12-02 DIAGNOSIS — Z8669 Personal history of other diseases of the nervous system and sense organs: Secondary | ICD-10-CM

## 2015-12-02 DIAGNOSIS — Z981 Arthrodesis status: Secondary | ICD-10-CM | POA: Diagnosis not present

## 2015-12-02 DIAGNOSIS — E669 Obesity, unspecified: Secondary | ICD-10-CM | POA: Diagnosis not present

## 2015-12-02 DIAGNOSIS — F1190 Opioid use, unspecified, uncomplicated: Secondary | ICD-10-CM

## 2015-12-02 DIAGNOSIS — M539 Dorsopathy, unspecified: Secondary | ICD-10-CM

## 2015-12-02 DIAGNOSIS — M797 Fibromyalgia: Secondary | ICD-10-CM | POA: Insufficient documentation

## 2015-12-02 DIAGNOSIS — M5416 Radiculopathy, lumbar region: Secondary | ICD-10-CM | POA: Diagnosis not present

## 2015-12-02 DIAGNOSIS — M792 Neuralgia and neuritis, unspecified: Secondary | ICD-10-CM

## 2015-12-02 DIAGNOSIS — T402X5A Adverse effect of other opioids, initial encounter: Secondary | ICD-10-CM

## 2015-12-02 DIAGNOSIS — F319 Bipolar disorder, unspecified: Secondary | ICD-10-CM | POA: Diagnosis not present

## 2015-12-02 DIAGNOSIS — N390 Urinary tract infection, site not specified: Secondary | ICD-10-CM | POA: Diagnosis not present

## 2015-12-02 DIAGNOSIS — R350 Frequency of micturition: Secondary | ICD-10-CM | POA: Diagnosis not present

## 2015-12-02 NOTE — Patient Instructions (Signed)
Please get blood work and x-ray done as soon as possible.

## 2015-12-02 NOTE — Progress Notes (Signed)
Patient's Name: Catherine Hughes MRN: [id number] DOB: 26-Aug-1951 DOS: 12/02/2015  Primary Reason(s) for Visit: Initial Patient Evaluation CC: Back Pain   HPI  Ms. Ledee is a 65 y.o. year old, female patient, who comes today for an initial evaluation. She has Urinary tract infection; History of spinal fusion (L4-L5 and L5-S1); Bladder pain; Urinary frequency; Gross hematuria; Atrophic vaginitis; Depression; SUI (stress urinary incontinence, female); Cystocele, grade 2; Chronic pain; Chronic pain syndrome; Long term current use of opiate analgesic; Long term prescription opiate use; Opiate use (95 MME/Day); Encounter for therapeutic drug level monitoring; Chronic low back pain (Location of Primary Source of Pain) (Bilateral) (L>R); Chronic lower extremity pain (Location of Secondary source of pain) (Bilateral) (L>R); Lumbar facet syndrome; Chronic lumbar radicular pain (Location of Secondary source of pain) (Bilateral) (L>R) (S1 Dermatome); 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 Her primarily concern today is the Back Pain   The patient comes in today clinics today indicating that she is interested in stopping her pain medication. She is looking for some assistance in doing so. The patient indicates that her primary pain is in the lower back with the pain being in the center but then traveling to both sides of her lower back. She indicates that its worst on the left side when compared to the right. Following this is the pain in her lower extremities, which is bilateral but with the left being worse than the right. In the case of the left lower extremity the pain goes to the bottom of her foot  in what seems to be an S1 dermatomal distribution. This pain travels down the side or lateral portion of the leg down into her foot. She indicates that the skin over her hip is not him. In the case of the right lower extremity the pain also goes down to the bottom of her foot, also following an S1 dermatomal distribution. Here the pain goes down the lateral aspect of the leg began the skin over her hip seems to be numb. The patient's third source of pain is her headaches. She describes this pain as bilateral with the left being worse than the right. The pain is said to start at the top of her head and then goes down both sides over the area of the lesser occipital nerve, bilaterally. From here he goes down to the neck where she also has pain bilaterally.  A search on their "Care Everywhere" showed that the patient had some x-rays of her lumbar spine as well as MRI since CT scans, but all of them were done at a [facility] institution around 1997 and 98. There should've nothing more recent than these. Review of imaging studies done at [facility] reveals that she had an x-ray of the lumbar spine with flexion and extension views that was done on 11/06/2003. More recently there is an x-ray of her right foot done on 11/19/2014.  The patient was asked about any interventional pain management techniques that she indicated that I were not seen 92 she had an epidural steroid injection done by a physician with an office on [address] in [place]. This apparently was done prior to her back surgery. She indicates that she hasn't had anything else done since.  Today's Pain Score: 3  (  last pain med today @ 1230) Reported level of pain is compatible with clinical observation Pain Type: Chronic pain Pain Location: Back Pain Orientation: Lower Pain Descriptors / Indicators: Sharp, Numbness, Tingling, Shooting Pain Frequency: Intermittent  Onset and Duration: Sudden, Gradual, Date of onset: 25 years ago and Present longer than  3 months Cause of pain: Unknown Severity: Getting better, NAS-11 at its worse: 10/10, NAS-11 at its best: 0/10, NAS-11 now: 3/10 and NAS-11 on the average: 3/10 Timing: Night and During activity or exercise Aggravating Factors: Bending, Kneeling, Lifiting, Squatting, Stooping , Walking, Walking uphill, Walking downhill and Working Alleviating Factors: Stretching, Cold packs, Hot packs, Lying down, Medications, Resting, Sleeping, Relaxation therapy and Warm showers or baths Associated Problems: Constipation, Day-time cramps, Depression, Fatigue, Inability to concentrate, Inability to control bladder (urine), Sadness, Weakness, Pain that wakes patient up and Pain that does not allow patient to sleep Quality of Pain: Aching, Agonizing, Intermittent, Cramping, Deep, Disabling, Distressing, Dreadful, Dull, Exhausting, Fearful, Heavy, Horrible, Hot, Nagging, Pressure-like, Pulsating, Punishing, Sharp, Shooting, Splitting, Superficial, Tender, Throbbing, Tingling, Tiring and Toothache-like Previous Examinations or Tests: Nerve block and X-rays Previous Treatments: Epidural steroid injections and Narcotic medications  Pharmacotherapy Review  Previously Prescribed Opioids: We have not been prescribing any of these patients pain medications. She indicates taking oxycodone IR 10 mg 5 times a day in addition to hydrocodone/APAP 5/325 one tablet by mouth every 6 hours when necessary for pain. (95 MME/Day) Side-effects or Adverse reactions: The patient indicates getting sleepy when she takes the pain medication, but as it turns out she combines it with a benzodiazepine. I took this opportunity to informed the patient about the CDC guidelines and how she should not be taking any type of benzodiazepines while she is taking opioids and much less at the very same time. In addition the patient indicates suffering from opioid-induced constipation. Effectiveness: The patient indicates that when she takes the pain  medication she gets 100% relief of her pain. I took this opportunity to informed the patient that in chronic pain management the goal is not to attain 100% relief of the pain, but to become more functional. By her own account, when she takes the medications she gets sleepy and less functional. Onset of action: Within expected pharmacological parameters. (20 minutes for the oxycodone and 10 minutes for the hydrocodone) Duration of action: Within normal limits for medication. (4.5 hours for the oxycodone and 4.5 hours for the hydrocodone) Peak effect: Timing and results are as within normal expected parameters. (30 minutes for the oxycodone and 20-30 minutes for the hydrocodone) [place] PMP: Reviewed Hospital associated UDS Results:  Lab Results  Component Value Date   THCU NEGATIVE 01/16/2012   PCPSCRNUR NEGATIVE 01/16/2012   MDMA NEGATIVE 01/16/2012   AMPHETMU NEGATIVE 01/16/2012   METHADONE NEGATIVE 01/16/2012   UDS Results: No UDS available, at this time UDS Interpretation: No UDS available, at this time Medication Assessment Form: Not applicable. Initial evaluation. The patient has not received any medications from our practice Treatment compliance: Not applicable. Initial evaluation Substance Use Disorder (SUD) Risk Level: Pending results of Medical Psychology Evaluation for SUD Pharmacologic Plan: Pending ordered tests and/or consults  Allergies  Ms. Balliet is allergic to tape.  Meds  The patient has a current medication list which includes the following prescription(s): alprazolam, fluoxetine, hydrocodone-acetaminophen, lidocaine, multivitamin, oxybutynin, oxycodone hcl, temazepam, and temazepam. Requested Prescriptions    No prescriptions requested or ordered in this encounter    ROS  Cardiovascular History: Negative for hypertension, coronary  artery diseas, myocardial infraction, anticoagulant therapy or heart failure Pulmonary or Respiratory History: Snoring  and Sleep  apnea Neurological History: Negative for epilepsy, stroke, urinary or fecal inontinence, spina bifida or tethered cord syndrome Psychological-Psychiatric History: Anxiety, Depression, Panic Attacks and Insomnia Gastrointestinal History: Hepatitis and Constipation Genitourinary History: Negative for nephrolithiasis, hematuria, renal failure or chronic kidney disease Hematological History: Negative for anticoagulant therapy, anemia, bruising or bleeding easily, hemophilia, sickle cell disease or trait, thrombocytopenia or coagulupathies Endocrine History: Negative for diabetes or thyroid disease Rheumatologic History: Fibromyalgia and Chronic Fatigue Syndrome Musculoskeletal History: Negative for myasthenia gravis, muscular dystrophy, multiple sclerosis or malignant hyperthermia Work History: Homemaker  [place]  Medical:  Ms. Cleverly  has a past medical history of Menopausal state; Anxiety; Depression; Bipolar 1 disorder ([place]); Fibromyalgia; Arthritis; Glaucoma; and Obesity (BMI 30.0-34.9). Family: family history includes Cancer in her mother; Coronary artery disease in her mother; Diabetes in her mother; Parkinson's disease in her father. There is no history of Kidney disease. Surgical:  has past surgical history that includes Appendectomy; Breast surgery (Bilateral, implants and removal); Spine surgery (L4-5); and Fracture surgery (Right, 2014). Tobacco:  reports that she quit smoking about 26 years ago. She has never used smokeless tobacco. Alcohol:  reports that she drinks alcohol. Drug:  reports that she does not use illicit drugs.  Physical Exam  Vitals:  Today's Vitals   12/02/15 1442  BP: 127/69  Pulse: 69  Temp: 99.2 F (37.3 C)  TempSrc: Oral  Resp: 16  Height: 5\' 4"  (1.626 m)  Weight: 167 lb (75.751 kg)  SpO2: 96%  PainSc: 3     Calculated BMI: Body mass index is 28.65 kg/(m^2).  General appearance: alert, cooperative, appears stated age, no distress and mildly obese Eyes:  PERLA Respiratory: No evidence respiratory distress, no audible rales or ronchi and no use of accessory muscles of respiration  Cervical Spine Inspection: Normal anatomy Alignment: Symetrical ROM: Adequate  Upper Extremities Inspection: No gross anomalies detected ROM: Adequate Sensory: Normal Motor: Unremarkable   Thoracic Spine Inspection: No gross anomalies detected Alignment: Symetrical ROM: Adequate  Lumbar Spine Inspection: No gross anomalies detected Alignment: Symetrical ROM: Adequate  Gait: WNL  Lower Extremities Inspection: No gross anomalies detected ROM: Adequate Sensory: Normal Motor: Unremarkable  Assessment  Primary Diagnosis & Pertinent Problem List: The primary encounter diagnosis was Chronic pain. 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  Visit Diagnosis: 1. Chronic pain   2. Chronic pain syndrome   3. Long term current use of opiate analgesic   4. Long term prescription opiate use   5. Opiate use   6. Encounter for therapeutic drug level monitoring   7. Chronic low back pain (Location of Primary Source of Pain) (Bilateral) (L>R)   8. Chronic pain of lower extremity, unspecified laterality   9. Lumbar facet syndrome   10. Chronic  lumbar radicular pain (Location of Secondary source of pain) (Bilateral) (L>R) (S1 Dermatome)  11. 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 Fusion of lumbosacral spine   13. Opioid-induced constipation (OIC)   14. Chronic intractable headache, unspecified headache type   15. Fibromyalgia   16. Neurogenic pain   17. History of chronic fatigue syndrome   18. Generalized anxiety disorder   19. History of panic attacks     Assessment: No problem-specific assessment & plan notes found for this encounter.   Plan of Care  Note: As per protocol, today's visit has been an evaluation only. We have not taken over the patient's controlled substance management.  Pharmacotherapy (Medications Ordered): No orders of the defined types were placed in this encounter.    Lab-work & Procedure Ordered: Orders Placed This Encounter  Procedures  . DG Lumbar Spine Complete W/Bend    Standing Status: Future     Number of Occurrences:      Standing Expiration Date: 12/01/2016    Scheduling Instructions:     Please include flexion and extension views and report any spinal instability (>4 mm displacement of any spondylolisthesis). If present, please report any spondylolisthesis grade, as well as displacement in millimeters.    Order Specific Question:  Reason for Exam (SYMPTOM  OR DIAGNOSIS REQUIRED)    Answer:  Low back pain    Order Specific Question:  Preferred imaging location?    Answer:  [facility]    Order Specific Question:  Call Results- Best Contact Number?    Answer:  [phone] (Pain Clinic facility) (Dr. Dossie Arbour)  . Drugs of abuse screen w/o alc, rtn urine-sln    Volume: 30 ml(s). Minimum 3 ml of urine is needed. Document temperature of fresh sample. Indications: Long term (current) use of opiate analgesic (Z79.891) Test#: [id number] (Comprehensive Profile)  . Comprehensive metabolic panel    Standing Status: Future     Number of Occurrences:      Standing  Expiration Date: 12/01/2016    Order Specific Question:  Has the patient fasted?    Answer:  No  . C-reactive protein    Standing Status: Future     Number of Occurrences:      Standing Expiration Date: 12/01/2016  . Magnesium    Standing Status: Future     Number of Occurrences:      Standing Expiration Date: 12/01/2016  . Sedimentation rate    Standing Status: Future     Number of Occurrences:      Standing Expiration Date: 12/01/2016  . Vitamin B12    Indication: Bone Pain (M89.9)    Standing Status: Future     Number of Occurrences:      Standing Expiration Date: 12/01/2016  . Vitamin D pnl(25-hydrxy+1,25-dihy)-bld    Standing Status: Future     Number of Occurrences:      Standing Expiration Date: 12/01/2016    Imaging Ordered: DG LUMBAR SPINE COMPLETE W/BEND 6+V  Interventional Therapies: Scheduled: None at this time PRN Procedures: None at this time    Referral(s) or Consult(s): None at this time.  Medications administered during this visit: Ms. Marcellino had no medications administered during this visit.  Future Appointments Date Time Provider [place]  12/22/2015 3:45 PM Guadalupe Maple, MD [facility] None    Primary Care Physician: Golden Pop, MD Location: [facility] Note by: Kathlen Brunswick. Dossie Arbour, M.D, DABA, DABAPM, DABPM, DABIPP, FIPP

## 2015-12-02 NOTE — Progress Notes (Signed)
Patient is here for suppression of longterm opiate use.  She desires to be weaned down and eventually off the medication that is being used to treat chronic back pain.  Patient suffers from fatigue and insomnia.

## 2015-12-04 DIAGNOSIS — M47816 Spondylosis without myelopathy or radiculopathy, lumbar region: Secondary | ICD-10-CM | POA: Insufficient documentation

## 2015-12-04 DIAGNOSIS — K5903 Drug induced constipation: Secondary | ICD-10-CM | POA: Insufficient documentation

## 2015-12-04 DIAGNOSIS — M961 Postlaminectomy syndrome, not elsewhere classified: Secondary | ICD-10-CM | POA: Insufficient documentation

## 2015-12-04 DIAGNOSIS — G8929 Other chronic pain: Secondary | ICD-10-CM | POA: Insufficient documentation

## 2015-12-04 DIAGNOSIS — M5416 Radiculopathy, lumbar region: Secondary | ICD-10-CM

## 2015-12-04 DIAGNOSIS — Z8669 Personal history of other diseases of the nervous system and sense organs: Secondary | ICD-10-CM | POA: Insufficient documentation

## 2015-12-04 DIAGNOSIS — F411 Generalized anxiety disorder: Secondary | ICD-10-CM | POA: Insufficient documentation

## 2015-12-04 DIAGNOSIS — R519 Headache, unspecified: Secondary | ICD-10-CM | POA: Insufficient documentation

## 2015-12-04 DIAGNOSIS — R51 Headache: Secondary | ICD-10-CM

## 2015-12-04 DIAGNOSIS — Z8659 Personal history of other mental and behavioral disorders: Secondary | ICD-10-CM | POA: Insufficient documentation

## 2015-12-04 DIAGNOSIS — M792 Neuralgia and neuritis, unspecified: Secondary | ICD-10-CM | POA: Insufficient documentation

## 2015-12-04 DIAGNOSIS — Z87898 Personal history of other specified conditions: Secondary | ICD-10-CM | POA: Insufficient documentation

## 2015-12-04 DIAGNOSIS — M797 Fibromyalgia: Secondary | ICD-10-CM | POA: Insufficient documentation

## 2015-12-04 DIAGNOSIS — T402X5A Adverse effect of other opioids, initial encounter: Secondary | ICD-10-CM

## 2015-12-07 ENCOUNTER — Other Ambulatory Visit
Admission: RE | Admit: 2015-12-07 | Discharge: 2015-12-07 | Disposition: A | Discharge status: Home / Self Care | Payer: Medicare Other | Source: Ambulatory Visit | Attending: Pain Medicine | Admitting: Pain Medicine

## 2015-12-07 ENCOUNTER — Ambulatory Visit
Admission: RE | Admit: 2015-12-07 | Discharge: 2015-12-07 | Disposition: A | Discharge status: Home / Self Care | Payer: Medicare Other | Source: Ambulatory Visit | Attending: Pain Medicine | Admitting: Pain Medicine

## 2015-12-07 DIAGNOSIS — M545 Low back pain, unspecified: Secondary | ICD-10-CM

## 2015-12-07 DIAGNOSIS — Z981 Arthrodesis status: Secondary | ICD-10-CM | POA: Diagnosis not present

## 2015-12-07 DIAGNOSIS — M542 Cervicalgia: Secondary | ICD-10-CM | POA: Diagnosis not present

## 2015-12-07 DIAGNOSIS — G8929 Other chronic pain: Secondary | ICD-10-CM

## 2015-12-07 LAB — COMPREHENSIVE METABOLIC PANEL
ALBUMIN: 4.4 g/dL (ref 3.5–5.0)
ALT: 20 U/L (ref 14–54)
ANION GAP: 4 — AB (ref 5–15)
AST: 21 U/L (ref 15–41)
Alkaline Phosphatase: 67 U/L (ref 38–126)
BUN: 16 mg/dL (ref 6–20)
CO2: 26 mmol/L (ref 22–32)
Calcium: 9.5 mg/dL (ref 8.9–10.3)
Chloride: 107 mmol/L (ref 101–111)
Creatinine, Ser: 0.72 mg/dL (ref 0.44–1.00)
GFR calc Af Amer: >60 mL/min (ref >60)
GFR calc non Af Amer: >60 mL/min (ref >60)
GLUCOSE: 104 mg/dL — AB (ref 65–99)
Potassium: 4.4 mmol/L (ref 3.5–5.1)
SODIUM: 137 mmol/L (ref 135–145)
Total Bilirubin: 0.6 mg/dL (ref 0.3–1.2)
Total Protein: 7.4 g/dL (ref 6.5–8.1)

## 2015-12-07 LAB — MAGNESIUM: Magnesium: 2 mg/dL (ref 1.7–2.4)

## 2015-12-07 LAB — C-REACTIVE PROTEIN: CRP: 1.2 mg/dL — ABNORMAL HIGH (ref <1.0)

## 2015-12-07 LAB — SEDIMENTATION RATE: SED RATE: 1 mm/h (ref 0–30)

## 2015-12-08 LAB — VITAMIN D PNL(25-HYDRXY+1,25-DIHY)-BLD
VIT D 1 25 DIHYDROXY: 59.3 pg/mL (ref 19.9–79.3)
VIT D 25 HYDROXY: 8.9 ng/mL — AB (ref 30.0–100.0)

## 2015-12-10 ENCOUNTER — Other Ambulatory Visit: Payer: Self-pay | Admitting: Family Medicine

## 2015-12-10 ENCOUNTER — Telehealth: Payer: Self-pay | Admitting: Family Medicine

## 2015-12-10 MED ORDER — HYDROCODONE-ACETAMINOPHEN 5-325 MG PO TABS: 1.0000 | ORAL_TABLET | Freq: Four times a day (QID) | ORAL | Status: DC | PRN

## 2015-12-10 NOTE — Telephone Encounter (Signed)
Pt needed refill on hydrocodone.

## 2015-12-11 LAB — TOXASSURE SELECT 13 (MW), URINE: PDF: 0

## 2015-12-13 NOTE — Progress Notes (Signed)
Quick Note:  NOTE: This forensic urine drug screen (UDS) test was conducted using a state-of-the-art ultra high performance liquid chromatography and mass spectrometry system (UPLC/MS-MS), the most sophisticated and accurate method available. UPLC/MS-MS is 1,000 times more precise and accurate than standard gas chromatography and mass spectrometry (GC/MS). This system can analyze 26 drug categories and 180 drug compounds.  The results of this test came back with unexpected findings. Unreported Benzodiazepine. ______

## 2015-12-14 ENCOUNTER — Ambulatory Visit (INDEPENDENT_AMBULATORY_CARE_PROVIDER_SITE_OTHER): Payer: Medicare Other | Admitting: Family Medicine

## 2015-12-14 ENCOUNTER — Encounter: Payer: Self-pay | Admitting: Family Medicine

## 2015-12-14 VITALS — BP 109/71 | HR 99 | Temp 97.8°F | Ht 63.4 in | Wt 174.0 lb

## 2015-12-14 DIAGNOSIS — N3001 Acute cystitis with hematuria: Secondary | ICD-10-CM

## 2015-12-14 DIAGNOSIS — K5903 Drug induced constipation: Secondary | ICD-10-CM | POA: Diagnosis not present

## 2015-12-14 DIAGNOSIS — T402X5A Adverse effect of other opioids, initial encounter: Secondary | ICD-10-CM

## 2015-12-14 DIAGNOSIS — R102 Pelvic and perineal pain: Secondary | ICD-10-CM | POA: Insufficient documentation

## 2015-12-14 MED ORDER — CIPROFLOXACIN HCL 500 MG PO TABS
500.0000 mg | ORAL_TABLET | Freq: Two times a day (BID) | ORAL | Status: DC
Start: 2015-12-14 — End: 2015-12-29

## 2015-12-14 MED ORDER — NALOXEGOL OXALATE 25 MG PO TABS: 25.0000 mg | ORAL_TABLET | Freq: Every day | ORAL | Status: DC

## 2015-12-14 NOTE — Assessment & Plan Note (Signed)
Likely mutifactorial. She would like to see GYN for further evaluation. Referral generated today. Follow up with urology as needed. Will hold on pap at this time and leave that up to GYN.

## 2015-12-14 NOTE — Assessment & Plan Note (Signed)
Significant tenderness in whole belly, not just in pelvis. Possibly contributing greatly to pelvic pain. Not doing well with ducolax. Rx for movantik given to patient today. To follow up with PCP in 1 month to see if it helps.

## 2015-12-14 NOTE — Progress Notes (Signed)
BP 109/71 mmHg  Pulse 99  Temp(Src) 97.8 F (36.6 C)  Ht 5' 3.4" (1.61 m)  Wt 174 lb (78.926 kg)  BMI 30.45 kg/m2  SpO2 100%   Subjective:    Patient ID: Catherine Hughes, female    DOB: April 17, 1951, 65 y.o.   MRN: [id number]  HPI: Catherine Hughes is a 65 y.o. female  Chief Complaint  Patient presents with  . pelvic cramping    Patient states that she is having some pelvic cramping that feels like period cramps   Chart reviewed. 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 Has been seeing pain management and recently interested in weaning off pain medication. Uncontrolled anxiety on low dose SSRI on high doses of benzodiazepines. To be seeing psychiatry. Has had atropic vaginitis with cystocele, stress incontence and bladder pain in the past. Has seen urology for this in the past and last saw them in October. Was doing well and they did not want to do surgery at that time. Due to see them again in January, but does not have appointment scheduled.  PELVIC PAIN- has what she describes as bad vaginal cramps.  Duration: 6 months Onset: gradual Severity: 8/10 Quality: cramping Frequency: a few times a day Episode duration: 15 minutes to an hour Alleviating factors: laying down, heating pad, vicodin Aggravating factors: nothing Previous evaluation/studies: nothing Treatments attempted: vaginal cream, oxybutinin- for urinary symptoms Fevers: no Nausea/vomiting: yes Vaginal discharge: no Dysmenorrhea: no- has not had a period in about 15 years Dyspareunia: no Weight loss: yes- 30lbs when trying to get off her oxycodone Dysuria: No Urinary frequency: yes  Urinary incontinence Just having an issues at night for the last week, feeling damp, no issues with loss of control during the day, no stress or urge incontinent during the day Hematuria: no History GYN procedures: yes  Relevant past medical, surgical, family and social history  reviewed and updated as indicated. Interim medical history since our last visit reviewed. Allergies and medications reviewed and updated.  Review of Systems  Constitutional: Negative.   Respiratory: Negative.   Cardiovascular: Negative.   Gastrointestinal: Positive for abdominal pain, constipation and abdominal distention. Negative for nausea, vomiting, diarrhea, blood in stool, anal bleeding and rectal pain.  Genitourinary: Positive for urgency, frequency, difficulty urinating and pelvic pain. Negative for dysuria, hematuria, flank pain, decreased urine volume, vaginal bleeding, vaginal discharge, enuresis, genital sores, vaginal pain, menstrual problem and dyspareunia.  Psychiatric/Behavioral: Negative.     Per HPI unless specifically indicated above     Objective:    BP 109/71 mmHg  Pulse 99  Temp(Src) 97.8 F (36.6 C)  Ht 5' 3.4" (1.61 m)  Wt 174 lb (78.926 kg)  BMI 30.45 kg/m2  SpO2 100%  Wt Readings from Last 3 Encounters:  12/14/15 174 lb (78.926 kg)  12/02/15 167 lb (75.751 kg)  10/08/15 164 lb (74.39 kg)    Physical Exam  Constitutional: She is oriented to person, place, and time. She appears well-developed and well-nourished. No distress.  HENT:  Head: Normocephalic and atraumatic.  Right Ear: Hearing normal.  Left Ear: Hearing normal.  Nose: Nose normal.  Eyes: Conjunctivae and lids are normal. Right eye exhibits no discharge. Left eye exhibits no discharge. No scleral icterus.  Cardiovascular: Normal rate, regular rhythm, normal heart sounds and intact distal pulses.  Exam reveals no gallop and no friction rub.   No murmur heard. Pulmonary/Chest: Effort normal and breath sounds normal. No respiratory  distress. She has no wheezes. She has no rales. She exhibits no tenderness.  Abdominal: Soft. Bowel sounds are normal. She exhibits distension. She exhibits no mass. There is generalized tenderness. There is no rigidity, no rebound, no guarding, no CVA tenderness, no  tenderness at McBurney's point and negative Murphy's sign.  Musculoskeletal: Normal range of motion.  Neurological: She is alert and oriented to person, place, and time.  Skin: Skin is warm, dry and intact. No rash noted. No erythema. No pallor.  Psychiatric: Judgment and thought content normal. Her affect is blunt. Her speech is slurred. She is slowed. Cognition and memory are normal.  Just took a vicodin. Appears overmedicated.  Nursing note and vitals reviewed.   Results for orders placed or performed during the hospital encounter of 12/07/15  Comprehensive metabolic panel  Result Value Ref Range   Sodium 137 135 - 145 mmol/L   Potassium 4.4 3.5 - 5.1 mmol/L   Chloride 107 101 - 111 mmol/L   CO2 26 22 - 32 mmol/L   Glucose, Bld 104 (H) 65 - 99 mg/dL   BUN 16 6 - 20 mg/dL   Creatinine, Ser 0.72 0.44 - 1.00 mg/dL   Calcium 9.5 8.9 - 10.3 mg/dL   Total Protein 7.4 6.5 - 8.1 g/dL   Albumin 4.4 3.5 - 5.0 g/dL   AST 21 15 - 41 U/L   ALT 20 14 - 54 U/L   Alkaline Phosphatase 67 38 - 126 U/L   Total Bilirubin 0.6 0.3 - 1.2 mg/dL   GFR calc non Af Amer >60 >60 mL/min   GFR calc Af Amer >60 >60 mL/min   Anion gap 4 (L) 5 - 15  C-reactive protein  Result Value Ref Range   CRP 1.2 (H) <1.0 mg/dL  Magnesium  Result Value Ref Range   Magnesium 2.0 1.7 - 2.4 mg/dL  Sedimentation rate  Result Value Ref Range   Sed Rate 1 0 - 30 mm/hr  Vitamin D pnl(25-hydrxy+1,25-dihy)-bld  Result Value Ref Range   Vit D, 1,25-Dihydroxy 59.3 19.9 - 79.3 pg/mL   Vit D, 25-Hydroxy 8.9 (L) 30.0 - 100.0 ng/mL      Assessment & Plan:   Problem List Items Addressed This Visit      Digestive   Opioid-induced constipation (OIC) (Chronic)    Significant tenderness in whole belly, not just in pelvis. Possibly contributing greatly to pelvic pain. Not doing well with ducolax. Rx for movantik given to patient today. To follow up with PCP in 1 month to see if it helps.         Genitourinary   Urinary  tract infection    Will treat UTI with cipro. Unlikely the cause of all her pain, but possibly increased dampness at night. Call if not getting better.         Other   Pelvic pain in female - Primary    Likely mutifactorial. She would like to see GYN for further evaluation. Referral generated today. Follow up with urology as needed. Will hold on pap at this time and leave that up to GYN.       Relevant Orders   UA/M w/rflx Culture, Routine   Ambulatory referral to Gynecology       Follow up plan: Return in about 4 weeks (around 01/11/2016) for follow up with PCP about abdominal and Pelvic pain.

## 2015-12-14 NOTE — Assessment & Plan Note (Signed)
Will treat UTI with cipro. Unlikely the cause of all her pain, but possibly increased dampness at night. Call if not getting better.

## 2015-12-15 ENCOUNTER — Other Ambulatory Visit: Payer: Self-pay | Admitting: Pain Medicine

## 2015-12-15 ENCOUNTER — Telehealth: Payer: Self-pay

## 2015-12-15 ENCOUNTER — Encounter: Payer: Self-pay | Admitting: Pain Medicine

## 2015-12-15 ENCOUNTER — Other Ambulatory Visit: Payer: Self-pay | Admitting: Family Medicine

## 2015-12-15 DIAGNOSIS — E559 Vitamin D deficiency, unspecified: Secondary | ICD-10-CM

## 2015-12-15 DIAGNOSIS — R7982 Elevated C-reactive protein (CRP): Secondary | ICD-10-CM | POA: Insufficient documentation

## 2015-12-15 MED ORDER — VITAMIN D3 50 MCG (2000 UT) PO CAPS: 2000.0000 [IU] | ORAL_CAPSULE | Freq: Every day | ORAL | Status: DC

## 2015-12-15 MED ORDER — VITAMIN D (ERGOCALCIFEROL) 1.25 MG (50000 UNIT) PO CAPS: 50000.0000 [IU] | ORAL_CAPSULE | ORAL | Status: AC

## 2015-12-15 MED ORDER — LUBIPROSTONE 24 MCG PO CAPS: 24.0000 ug | ORAL_CAPSULE | Freq: Two times a day (BID) | ORAL | Status: DC

## 2015-12-15 NOTE — Progress Notes (Signed)
Quick Note:   Normal fasting (NPO x 8 hours) glucose levels are between 65-99 mg/dl, with 2 hour fasting, levels are usually less than 140 mg/dl. Any random blood glucose level greater than 200 mg/dl is considered to be Diabetes.  ______ 

## 2015-12-15 NOTE — Progress Notes (Signed)
Quick Note:  Lab results reviewed and found to be within normal limits. ______ 

## 2015-12-15 NOTE — Progress Notes (Signed)

## 2015-12-15 NOTE — Telephone Encounter (Signed)
Movantik is not covered by patients insurance plan, do you know of a different medication that will be covered by her insurance

## 2015-12-15 NOTE — Progress Notes (Signed)

## 2015-12-15 NOTE — Telephone Encounter (Signed)
Prior authorization started on the medication

## 2015-12-16 ENCOUNTER — Telehealth: Payer: Self-pay | Admitting: Family Medicine

## 2015-12-16 LAB — UA/M W/RFLX CULTURE, ROUTINE
Bilirubin, UA: NEGATIVE
GLUCOSE, UA: NEGATIVE
KETONES UA: NEGATIVE
NITRITE UA: NEGATIVE
Protein, UA: NEGATIVE
SPEC GRAV UA: 1.025 (ref 1.005–1.030)
UUROB: 0.2 mg/dL (ref 0.2–1.0)
pH, UA: 5 (ref 5.0–7.5)

## 2015-12-16 LAB — URINE CULTURE, REFLEX

## 2015-12-16 MED ORDER — OXYCODONE HCL 10 MG PO TABS: 10.0000 mg | ORAL_TABLET | Freq: Every day | ORAL | Status: DC

## 2015-12-16 NOTE — Telephone Encounter (Signed)
Pt would like a refill on oxycodone.

## 2015-12-22 ENCOUNTER — Ambulatory Visit: Payer: Self-pay | Admitting: Pain Medicine

## 2015-12-22 ENCOUNTER — Telehealth: Payer: Self-pay | Admitting: Family Medicine

## 2015-12-22 ENCOUNTER — Ambulatory Visit: Payer: Self-pay | Admitting: Family Medicine

## 2015-12-22 NOTE — Telephone Encounter (Signed)
She needs to see her urologist or her gynecologist.

## 2015-12-22 NOTE — Telephone Encounter (Signed)
Patient picked up the Cipro from the pharmacy on 12/14/15 that is the last time she has been seen. If patient is still having symptoms please get her scheduled

## 2015-12-22 NOTE — Telephone Encounter (Signed)
Pt called stated the pharmacy never received the RX for her UTI. Can the medication be called in. [facility] in [place]. Thanks

## 2015-12-25 NOTE — Telephone Encounter (Signed)
Erroneous entry

## 2015-12-29 ENCOUNTER — Encounter: Payer: Self-pay | Admitting: Pain Medicine

## 2015-12-29 ENCOUNTER — Ambulatory Visit: Payer: Medicare Other | Attending: Pain Medicine | Admitting: Pain Medicine

## 2015-12-29 VITALS — BP 109/76 | HR 82 | Temp 98.4°F | Resp 16 | Ht 64.0 in | Wt 172.0 lb

## 2015-12-29 DIAGNOSIS — Z87891 Personal history of nicotine dependence: Secondary | ICD-10-CM | POA: Diagnosis not present

## 2015-12-29 DIAGNOSIS — Z79891 Long term (current) use of opiate analgesic: Secondary | ICD-10-CM

## 2015-12-29 DIAGNOSIS — M961 Postlaminectomy syndrome, not elsewhere classified: Secondary | ICD-10-CM

## 2015-12-29 DIAGNOSIS — E559 Vitamin D deficiency, unspecified: Secondary | ICD-10-CM | POA: Diagnosis not present

## 2015-12-29 DIAGNOSIS — Z5181 Encounter for therapeutic drug level monitoring: Secondary | ICD-10-CM

## 2015-12-29 DIAGNOSIS — F319 Bipolar disorder, unspecified: Secondary | ICD-10-CM | POA: Diagnosis not present

## 2015-12-29 DIAGNOSIS — N393 Stress incontinence (female) (male): Secondary | ICD-10-CM | POA: Diagnosis not present

## 2015-12-29 DIAGNOSIS — N390 Urinary tract infection, site not specified: Secondary | ICD-10-CM | POA: Diagnosis not present

## 2015-12-29 DIAGNOSIS — H409 Unspecified glaucoma: Secondary | ICD-10-CM | POA: Insufficient documentation

## 2015-12-29 DIAGNOSIS — M545 Low back pain, unspecified: Secondary | ICD-10-CM

## 2015-12-29 DIAGNOSIS — G8929 Other chronic pain: Secondary | ICD-10-CM

## 2015-12-29 DIAGNOSIS — M539 Dorsopathy, unspecified: Secondary | ICD-10-CM | POA: Diagnosis not present

## 2015-12-29 DIAGNOSIS — M797 Fibromyalgia: Secondary | ICD-10-CM | POA: Diagnosis not present

## 2015-12-29 DIAGNOSIS — M25519 Pain in unspecified shoulder: Secondary | ICD-10-CM | POA: Diagnosis present

## 2015-12-29 DIAGNOSIS — Z6830 Body mass index (BMI) 30.0-30.9, adult: Secondary | ICD-10-CM | POA: Diagnosis not present

## 2015-12-29 DIAGNOSIS — T402X5A Adverse effect of other opioids, initial encounter: Secondary | ICD-10-CM | POA: Diagnosis not present

## 2015-12-29 DIAGNOSIS — R51 Headache: Secondary | ICD-10-CM | POA: Insufficient documentation

## 2015-12-29 DIAGNOSIS — M79605 Pain in left leg: Secondary | ICD-10-CM | POA: Diagnosis not present

## 2015-12-29 DIAGNOSIS — E669 Obesity, unspecified: Secondary | ICD-10-CM | POA: Diagnosis not present

## 2015-12-29 DIAGNOSIS — M549 Dorsalgia, unspecified: Secondary | ICD-10-CM | POA: Diagnosis present

## 2015-12-29 DIAGNOSIS — R310 Gross hematuria: Secondary | ICD-10-CM | POA: Diagnosis not present

## 2015-12-29 DIAGNOSIS — F1190 Opioid use, unspecified, uncomplicated: Secondary | ICD-10-CM | POA: Diagnosis not present

## 2015-12-29 DIAGNOSIS — M79673 Pain in unspecified foot: Secondary | ICD-10-CM | POA: Diagnosis present

## 2015-12-29 DIAGNOSIS — N952 Postmenopausal atrophic vaginitis: Secondary | ICD-10-CM | POA: Diagnosis not present

## 2015-12-29 DIAGNOSIS — M79604 Pain in right leg: Secondary | ICD-10-CM | POA: Diagnosis not present

## 2015-12-29 DIAGNOSIS — F329 Major depressive disorder, single episode, unspecified: Secondary | ICD-10-CM | POA: Insufficient documentation

## 2015-12-29 DIAGNOSIS — K5903 Drug induced constipation: Secondary | ICD-10-CM | POA: Diagnosis not present

## 2015-12-29 DIAGNOSIS — F32A Depression, unspecified: Secondary | ICD-10-CM

## 2015-12-29 DIAGNOSIS — N8110 Cystocele, unspecified: Secondary | ICD-10-CM | POA: Insufficient documentation

## 2015-12-29 DIAGNOSIS — Z981 Arthrodesis status: Secondary | ICD-10-CM | POA: Insufficient documentation

## 2015-12-29 DIAGNOSIS — M47816 Spondylosis without myelopathy or radiculopathy, lumbar region: Secondary | ICD-10-CM

## 2015-12-29 DIAGNOSIS — Z79899 Other long term (current) drug therapy: Secondary | ICD-10-CM

## 2015-12-29 MED ORDER — OXYCODONE HCL 5 MG PO TABS
ORAL_TABLET | ORAL | Status: DC
Start: 2015-12-29 — End: 2016-03-10

## 2015-12-29 MED ORDER — OXYCODONE HCL 5 MG PO TABS: ORAL_TABLET | ORAL | Status: DC

## 2015-12-29 MED ORDER — TIZANIDINE HCL 2 MG PO TABS: 2.0000 mg | ORAL_TABLET | Freq: Four times a day (QID) | ORAL | Status: DC | PRN

## 2015-12-29 NOTE — Patient Instructions (Addendum)
Pain Management Discharge Instructions  General Discharge Instructions :  If you need to reach your doctor call: Monday-Friday 8:00 am - 4:00 pm at [phone] or toll free [phone].  After clinic hours [phone] to have operator reach doctor.  Bring all of your medication bottles to all your appointments in the pain clinic.  To cancel or reschedule your appointment with Pain Management please remember to call 24 hours in advance to avoid a fee.  Refer to the educational materials which you have been given on: General Risks, I had my Procedure. Discharge Instructions, Post Sedation.  Post Procedure Instructions:  The drugs you were given will stay in your system until tomorrow, so for the next 24 hours you should not drive, make any legal decisions or drink any alcoholic beverages.  You may eat anything you prefer, but it is better to start with liquids then soups and crackers, and gradually work up to solid foods.  Please notify your doctor immediately if you have any unusual bleeding, trouble breathing or pain that is not related to your normal pain.  Depending on the type of procedure that was done, some parts of your body may feel week and/or numb.  This usually clears up by tonight or the next day.  Walk with the use of an assistive device or accompanied by an adult for the 24 hours.  You may use ice on the affected area for the first 24 hours.  Put ice in a Ziploc bag and cover with a towel and place against area 15 minutes on 15 minutes off.  You may switch to heat after 24 hours.GENERAL RISKS AND COMPLICATIONS  What are the risk, side effects and possible complications? Generally speaking, most procedures are safe.  However, with any procedure there are risks, side effects, and the possibility of complications.  The risks and complications are dependent upon the sites that are lesioned, or the type of nerve block to be performed.  The closer the procedure is to the spine,  the more serious the risks are.  Great care is taken when placing the radio frequency needles, block needles or lesioning probes, but sometimes complications can occur. 1. Infection: Any time there is an injection through the skin, there is a risk of infection.  This is why sterile conditions are used for these blocks.  There are four possible types of infection. 1. Localized skin infection. 2. Central Nervous System Infection-This can be in the form of Meningitis, which can be deadly. 3. Epidural Infections-This can be in the form of an epidural abscess, which can cause pressure inside of the spine, causing compression of the spinal cord with subsequent paralysis. This would require an emergency surgery to decompress, and there are no guarantees that the patient would recover from the paralysis. 4. Discitis-This is an infection of the intervertebral discs.  It occurs in about 1% of discography procedures.  It is difficult to treat and it may lead to surgery.        2. Pain: the needles have to go through skin and soft tissues, will cause soreness.       3. Damage to internal structures:  The nerves to be lesioned may be near blood vessels or    other nerves which can be potentially damaged.       4. Bleeding: Bleeding is more common if the patient is taking blood thinners such as  aspirin, Coumadin, Ticiid, Plavix, etc., or if he/she have some genetic predisposition  such as   hemophilia. Bleeding into the spinal canal can cause compression of the spinal  cord with subsequent paralysis.  This would require an emergency surgery to  decompress and there are no guarantees that the patient would recover from the  paralysis.       5. Pneumothorax:  Puncturing of a lung is a possibility, every time a needle is introduced in  the area of the chest or upper back.  Pneumothorax refers to free air around the  collapsed lung(s), inside of the thoracic cavity (chest cavity).  Another two possible  complications  related to a similar event would include: Hemothorax and Chylothorax.   These are variations of the Pneumothorax, where instead of air around the collapsed  lung(s), you may have blood or chyle, respectively.       6. Spinal headaches: They may occur with any procedures in the area of the spine.       7. Persistent CSF (Cerebro-Spinal Fluid) leakage: This is a rare problem, but may occur  with prolonged intrathecal or epidural catheters either due to the formation of a fistulous  track or a dural tear.       8. Nerve damage: By working so close to the spinal cord, there is always a possibility of  nerve damage, which could be as serious as a permanent spinal cord injury with  paralysis.       9. Death:  Although rare, severe deadly allergic reactions known as "Anaphylactic  reaction" can occur to any of the medications used.      10. Worsening of the symptoms:  We can always make thing worse.  What are the chances of something like this happening? Chances of any of this occuring are extremely low.  By statistics, you have more of a chance of getting killed in a motor vehicle accident: while driving to the hospital than any of the above occurring .  Nevertheless, you should be aware that they are possibilities.  In general, it is similar to taking a shower.  Everybody knows that you can slip, hit your head and get killed.  Does that mean that you should not shower again?  Nevertheless always keep in mind that statistics do not mean anything if you happen to be on the wrong side of them.  Even if a procedure has a 1 (one) in a 1,000,000 (million) chance of going wrong, it you happen to be that one..Also, keep in mind that by statistics, you have more of a chance of having something go wrong when taking medications.  Who should not have this procedure? If you are on a blood thinning medication (e.g. Coumadin, Plavix, see list of "Blood Thinners"), or if you have an active infection going on, you should not  have the procedure.  If you are taking any blood thinners, please inform your physician.  How should I prepare for this procedure?  Do not eat or drink anything at least six hours prior to the procedure.  Bring a driver with you .  It cannot be a taxi.  Come accompanied by an adult that can drive you back, and that is strong enough to help you if your legs get weak or numb from the local anesthetic.  Take all of your medicines the morning of the procedure with just enough water to swallow them.  If you have diabetes, make sure that you are scheduled to have your procedure done first thing in the morning, whenever possible.  If you have diabetes,   take only half of your insulin dose and notify our nurse that you have done so as soon as you arrive at the clinic.  If you are diabetic, but only take blood sugar pills (oral hypoglycemic), then do not take them on the morning of your procedure.  You may take them after you have had the procedure.  Do not take aspirin or any aspirin-containing medications, at least eleven (11) days prior to the procedure.  They may prolong bleeding.  Wear loose fitting clothing that may be easy to take off and that you would not mind if it got stained with Betadine or blood.  Do not wear any jewelry or perfume  Remove any nail coloring.  It will interfere with some of our monitoring equipment.  NOTE: Remember that this is not meant to be interpreted as a complete list of all possible complications.  Unforeseen problems may occur.  BLOOD THINNERS The following drugs contain aspirin or other products, which can cause increased bleeding during surgery and should not be taken for 2 weeks prior to and 1 week after surgery.  If you should need take something for relief of minor pain, you may take acetaminophen which is found in Tylenol,m Datril, Anacin-3 and Panadol. It is not blood thinner. The products listed below are.  Do not take any of the products listed below  in addition to any listed on your instruction sheet.  A.P.C or A.P.C with Codeine Codeine Phosphate Capsules #3 Ibuprofen Ridaura  ABC compound Congesprin Imuran rimadil  Advil Cope Indocin Robaxisal  Alka-Seltzer Effervescent Pain Reliever and Antacid Coricidin or Coricidin-D  Indomethacin Rufen  Alka-Seltzer plus Cold Medicine Cosprin Ketoprofen S-A-C Tablets  Anacin Analgesic Tablets or Capsules Coumadin Korlgesic Salflex  Anacin Extra Strength Analgesic tablets or capsules CP-2 Tablets Lanoril Salicylate  Anaprox Cuprimine Capsules Levenox Salocol  Anexsia-D Dalteparin Magan Salsalate  Anodynos Darvon compound Magnesium Salicylate Sine-off  Ansaid Dasin Capsules Magsal Sodium Salicylate  Anturane Depen Capsules Marnal Soma  APF Arthritis pain formula Dewitt's Pills Measurin Stanback  Argesic Dia-Gesic Meclofenamic Sulfinpyrazone  Arthritis Bayer Timed Release Aspirin Diclofenac Meclomen Sulindac  Arthritis pain formula Anacin Dicumarol Medipren Supac  Analgesic (Safety coated) Arthralgen Diffunasal Mefanamic Suprofen  Arthritis Strength Bufferin Dihydrocodeine Mepro Compound Suprol  Arthropan liquid Dopirydamole Methcarbomol with Aspirin Synalgos  ASA tablets/Enseals Disalcid Micrainin Tagament  Ascriptin Doan's Midol Talwin  Ascriptin A/D Dolene Mobidin Tanderil  Ascriptin Extra Strength Dolobid Moblgesic Ticlid  Ascriptin with Codeine Doloprin or Doloprin with Codeine Momentum Tolectin  Asperbuf Duoprin Mono-gesic Trendar  Aspergum Duradyne Motrin or Motrin IB Triminicin  Aspirin plain, buffered or enteric coated Durasal Myochrisine Trigesic  Aspirin Suppositories Easprin Nalfon Trillsate  Aspirin with Codeine Ecotrin Regular or Extra Strength Naprosyn Uracel  Atromid-S Efficin Naproxen Ursinus  Auranofin Capsules Elmiron Neocylate Vanquish  Axotal Emagrin Norgesic Verin  Azathioprine Empirin or Empirin with Codeine Normiflo Vitamin E  Azolid Emprazil Nuprin Voltaren  Bayer  Aspirin plain, buffered or children's or timed BC Tablets or powders Encaprin Orgaran Warfarin Sodium  Buff-a-Comp Enoxaparin Orudis Zorpin  Buff-a-Comp with Codeine Equegesic Os-Cal-Gesic   Buffaprin Excedrin plain, buffered or Extra Strength Oxalid   Bufferin Arthritis Strength Feldene Oxphenbutazone   Bufferin plain or Extra Strength Feldene Capsules Oxycodone with Aspirin   Bufferin with Codeine Fenoprofen Fenoprofen Pabalate or Pabalate-SF   Buffets II Flogesic Panagesic   Buffinol plain or Extra Strength Florinal or Florinal with Codeine Panwarfarin   Buf-Tabs Flurbiprofen Penicillamine   Butalbital Compound Four-way cold tablets   Penicillin   Butazolidin Fragmin Pepto-Bismol   Carbenicillin Geminisyn Percodan   Carna Arthritis Reliever Geopen Persantine   Carprofen Gold's salt Persistin   Chloramphenicol Goody's Phenylbutazone   Chloromycetin Haltrain Piroxlcam   Clmetidine heparin Plaquenil   Cllnoril Hyco-pap Ponstel   Clofibrate Hydroxy chloroquine Propoxyphen         Before stopping any of these medications, be sure to consult the physician who ordered them.  Some, such as Coumadin (Warfarin) are ordered to prevent or treat serious conditions such as "deep thrombosis", "pumonary embolisms", and other heart problems.  The amount of time that you may need off of the medication may also vary with the medication and the reason for which you were taking it.  If you are taking any of these medications, please make sure you notify your pain physician before you undergo any procedures.         Facet Joint Block, Care After Refer to this sheet in the next few weeks. These instructions provide you with information on caring for yourself after your procedure. Your health care provider may also give you more specific instructions. Your treatment has been planned according to current medical practices, but problems sometimes occur. Call your health care provider if you have any problems  or questions after your procedure. HOME CARE INSTRUCTIONS  2. Keep track of the amount of pain relief you feel and how long it lasts. 3. Limit pain medicine within the first 4-6 hours after the procedure as directed by your health care provider. 4. Resume taking dietary supplements and medicines as directed by your health care provider. 5. You may resume your regular diet. 6. Do not apply heat near or over the injection site(s) for 24 hours.  7. Do not take a bath or soak in water (such as a pool or lake) for 24 hours. 8. Do not drive for 24 hours unless approved by your health care provider. 9. Avoid strenuous activity for 24 hours. 10. Remove your bandages the morning after the procedure.  11. If the injection site is tender, applying an ice pack may relieve some tenderness. To do this: 1. Put ice in a bag. 2. Place a towel between your skin and the bag. 3. Leave the ice on for 15-20 minutes, 3-4 times a day. 12. Keep follow-up appointments as directed by your health care provider. SEEK MEDICAL CARE IF:   Your pain is not controlled by your medicines.   There is drainage from the injection site.   There is significant bleeding or swelling at the injection site.  You have diabetes and your blood sugar is above 180 mg/dL. SEEK IMMEDIATE MEDICAL CARE IF:  1. You develop a fever of 101F (38.3C) or greater.  2. You have worsening pain or swelling around the injection site.  3. You have red streaking around the injection site.  4. You develop severe pain that is not controlled by your medicines.  5. You develop a headache, stiff neck, nausea, or vomiting.  6. Your eyes become very sensitive to light.  7. You have weakness, paralysis, or tingling in your arms or legs that was not present before the procedure.  8. You develop difficulty urinating or breathing.    This information is not intended to replace advice given to you by your health care provider. Make sure you  discuss any questions you have with your health care provider.   Document Released: 09/19/2012 Document Revised: 10/24/2014 Document Reviewed: 09/19/2012 Elsevier Interactive Patient Education 2016  [employer] Facet Joint Block The facet joints connect the bones of the spine (vertebrae). They make it possible for you to bend, twist, and make other movements with your spine. They also prevent you from overbending, overtwisting, and making other excessive movements.  A facet joint block is a procedure where a numbing medicine (anesthetic) is injected into a facet joint. Often, a type of anti-inflammatory medicine called a steroid is also injected. A facet joint block may be done for two reasons:  13. Diagnosis. A facet joint block may be done as a test to see whether neck or back pain is caused by a worn-down or infected facet joint. If the pain gets better after a facet joint block, it means the pain is probably coming from the facet joint. If the pain does not get better, it means the pain is probably not coming from the facet joint.  14. Therapy. A facet joint block may be done to relieve neck or back pain caused by a facet joint. A facet joint block is only done as a therapy if the pain does not improve with medicine, exercise programs, physical therapy, and other forms of pain management. LET [facility] CARE PROVIDER KNOW ABOUT:   Any allergies you have.   All medicines you are taking, including vitamins, herbs, eyedrops, and over-the-counter medicines and creams.   Previous problems you or members of your family have had with the use of anesthetics.   Any blood disorders you have had.   Other health problems you have. RISKS AND COMPLICATIONS Generally, having a facet joint block is safe. However, as with any procedure, complications can occur. Possible complications associated with having a facet joint block include:  9. Bleeding.  10. Injury to a nerve near the injection site.   11. Pain at the injection site.  12. Weakness or numbness in areas controlled by nerves near the injection site.  13. Infection.  14. Temporary fluid retention.  15. Allergic reaction to anesthetics or medicines used during the procedure. BEFORE THE PROCEDURE   Follow your health care provider's instructions if you are taking dietary supplements or medicines. You may need to stop taking them or reduce your dosage.   Do not take any new dietary supplements or medicines without asking your health care provider first.   Follow your health care provider's instructions about eating and drinking before the procedure. You may need to stop eating and drinking several hours before the procedure.   Arrange to have an adult drive you home after the procedure. PROCEDURE 12. You may need to remove your clothing and dress in an open-back gown so that your health care provider can access your spine.  13. The procedure will be done while you are lying on an X-ray table. Most of the time you will be asked to lie on your stomach, but you may be asked to lie in a different position if an injection will be made in your neck.  14. Special machines will be used to monitor your oxygen levels, heart rate, and blood pressure.  15. If an injection will be made in your neck, an intravenous (IV) tube will be inserted into one of your veins. Fluids and medicine will flow directly into your body through the IV tube.  16. The area over the facet joint where the injection will be made will be cleaned with an antiseptic soap. The surrounding skin will be covered with sterile drapes.  17. An anesthetic will be applied to  your skin to make the injection area numb. You may feel a temporary stinging or burning sensation.  18. A video X-ray machine will be used to locate the joint. A contrast dye may be injected into the facet joint area to help with locating the joint.  19. When the joint is located, an anesthetic  medicine will be injected into the joint through the needle.  23. Your health care provider will ask you whether you feel pain relief. If you do feel relief, a steroid may be injected to provide pain relief for a longer period of time. If you do not feel relief or feel only partial relief, additional injections of an anesthetic may be made in other facet joints.  21. The needle will be removed, the skin will be cleansed, and bandages will be applied.  AFTER THE PROCEDURE  1. You will be observed for 15-30 minutes before being allowed to go home. Do not drive. Have an adult drive you or take a taxi or public transportation instead.  2. If you feel pain relief, the pain will return in several hours or days when the anesthetic wears off.  3. You may feel pain relief 2-14 days after the procedure. The amount of time this relief lasts varies from person to person.  4. It is normal to feel some tenderness over the injected area(s) for 2 days following the procedure.  5. If you have diabetes, you may have a temporary increase in blood sugar.   This information is not intended to replace advice given to you by your health care provider. Make sure you discuss any questions you have with your health care provider.   Document Released: 02/22/2007 Document Revised: 10/24/2014 Document Reviewed: 07/23/2012 Elsevier Interactive Patient Education 2016 [place] Facet Blocks Patient Information  Description: The facets are joints in the spine between the vertebrae.  Like any joints in the body, facets can become irritated and painful.  Arthritis can also effect the facets.  By injecting steroids and local anesthetic in and around these joints, we can temporarily block the nerve supply to them.  Steroids act directly on irritated nerves and tissues to reduce selling and inflammation which often leads to decreased pain.  Facet blocks may be done anywhere along the spine from the neck to the low back  depending upon the location of your pain.   After numbing the skin with local anesthetic (like Novocaine), a small needle is passed onto the facet joints under x-ray guidance.  You may experience a sensation of pressure while this is being done.  The entire block usually lasts about 15-25 minutes.   Conditions which may be treated by facet blocks:   Low back/buttock pain  Neck/shoulder pain  Certain types of headaches  Preparation for the injection:  16. Do not eat any solid food or dairy products within 8 hours of your appointment. 17. You may drink clear liquid up to 3 hours before appointment.  Clear liquids include water, black coffee, juice or soda.  No milk or cream please. 29. You may take your regular medication, including pain medications, with a sip of water before your appointment.  Diabetics should hold regular insulin (if taken separately) and take 1/2 normal NPH dose the morning of the procedure.  Carry some sugar containing items with you to your appointment. 19. A driver must accompany you and be prepared to drive you home after your procedure. 58. Bring all your current medications with you. 21. An IV  may be inserted and sedation may be given at the discretion of the physician. 22. A blood pressure cuff, EKG and other monitors will often be applied during the procedure.  Some patients may need to have extra oxygen administered for a short period. 29. You will be asked to provide medical information, including your allergies and medications, prior to the procedure.  We must know immediately if you are taking blood thinners (like Coumadin/Warfarin) or if you are allergic to IV iodine contrast (dye).  We must know if you could possible be pregnant.  Possible side-effects:   Bleeding from needle site  Infection (rare, may require surgery)  Nerve injury (rare)  Numbness & tingling (temporary)  Difficulty urinating (rare, temporary)  Spinal headache (a headache worse  with upright posture)  Light-headedness (temporary)  Pain at injection site (serveral days)  Decreased blood pressure (rare, temporary)  Weakness in arm/leg (temporary)  Pressure sensation in back/neck (temporary)   Call if you experience:   Fever/chills associated with headache or increased back/neck pain  Headache worsened by an upright position  New onset, weakness or numbness of an extremity below the injection site  Hives or difficulty breathing (go to the emergency room)  Inflammation or drainage at the injection site(s)  Severe back/neck pain greater than usual  New symptoms which are concerning to you  Please note:  Although the local anesthetic injected can often make your back or neck feel good for several hours after the injection, the pain will likely return. It takes 3-7 days for steroids to work.  You may not notice any pain relief for at least one week.  If effective, we will often do a series of 2-3 injections spaced 3-6 weeks apart to maximally decrease your pain.  After the initial series, you may be a candidate for a more permanent nerve block of the facets.  If you have any questions, please call #[phone]) [facility]

## 2015-12-29 NOTE — Progress Notes (Signed)
Safety precautions to be maintained throughout the outpatient stay will include: orient to surroundings, keep bed in low position, maintain call bell within reach at all times, provide assistance with transfer out of bed and ambulation.  Pills counted oxycodone 10 mg  Quantity 99 pills counted out of 168. 99 pills of oxycodone Wasted in toilet 99 pills , witnessed Burnett Harry, RN, and   [employer]  Who was accompanied  With the patient.

## 2015-12-29 NOTE — Progress Notes (Signed)
Patient's Name: Catherine Hughes MRN: [id number] DOB: 1951-08-22 DOS: 12/29/2015  Primary Reason(s) for Visit: Appointment to review the results of the lab work and x-rays. CC: Foot Pain; Back Pain; and Shoulder Pain   HPI  Ms. Ahlstrom is a 65 y.o. year old, female patient, who returns today as an established patient. She has Urinary tract infection; History of spinal fusion (L4-L5 and L5-S1); Bladder pain; Urinary frequency; Gross hematuria; Atrophic vaginitis; Depression; SUI (stress urinary incontinence, female); Cystocele, grade 2; Chronic pain; Chronic pain syndrome; Long term current use of opiate analgesic; Long term prescription opiate use; Opiate use (95 MME/Day); Encounter for therapeutic drug level monitoring; Chronic low back pain (Location of Primary Source of Pain) (Bilateral) (L>R); Chronic lower extremity pain (Location of Secondary source of pain) (Bilateral) (L>R); Lumbar facet syndrome; Chronic lumbar radicular pain (Location of Secondary source of pain) (Bilateral) (L>R) (S1 Dermatome); 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 Her primarily concern today is the Foot Pain; Back Pain; and Shoulder Pain   The patient comes into the clinics today for her second visit. She comes in today with a friend. Today I went over all of the lab results as well as the results of all the diagnostic imaging. Each one of them was explained to the patient in detail and copies were also provided to the patient. Wasn't went over everything, I presented the patient a treatment  plan which consisted of doing some diagnostic lumbar facet blocks possibly followed by radiofrequency ablation, should she get good relief of the pain. We talked about those options and then the patient indicated that her primary concern was to come off of all of the medications that she is on. She indicated that she was not interested in having any steroid injections, which time I explained to her that the diagnostic lumbar facet blocks did not have to be done with steroids. In any case, I have given the patient the option of having the injection done upon her request. I have put a prn order for the lumbar facet blocks. I also explained to her how this works.  Since the patient is primarily interested in coming off of her pain medication I took time to write down for her a tapering schedule. Today the patient brought with her 61 of her 168 oxycodone IR 10 mg pills. These were counted and destroyed in front of the patient, her friend Ginnie Smart), by 2 nurses, Ms. Lona Millard, RN and Ms. Leotis Shames, RN.  The patient was then provided with a copy of the tapering schedule and a series of prescriptions for oxycodone IR 5 mg to be filled on a weekly basis for a total 9 weeks. The schedule slowly lowers her dose by 5 mg per week to 0. Previously the patient had indicated that she was also taken hydrocodone/APAP 5/325 one tablet by mouth every 6 hours when necessary for pain. Apparently her last prescription was written on 12/10/2015, but by today 12/29/2015 she indicated having none left. In any case, the patient was not given a prescription for any further hydrocodone. The patient was also asked if she had any more medication left at home and  she indicated that she did not. I asked her friend to make sure that this is correct since it would be a significant problem that she had any medications at home that she could take.  The patient was provided with a 2 mg to be taken every 6 hours when necessary in  the event that she started having significant with doll's. The prescription is not to be filled until that time when she is out of her opioids. The patient asked me if she should take alprazolam to help with her withdrawals and I was very clear to her that she should not. Furthermore, I reminded her that the CDC guidelines stipulated that she should not be taking any benzodiazepines while taking opioids. The patient was instructed to discontinue the use of the alprazolam as well as the temazepam, if possible. The patient was instructed to get the help of a psychiatrist should she feel that she needs to continue on these medications. I also suggested using the psychiatrist to help her is continue the medications should this be what she wants to do. She requested to get a referral to a psychiatrist.  Pain Assessment: Self-Reported Pain Score: 6 , clinically she looks like a 2/10. Reported level is inconsistent with clinical obrservations Pain Type: Chronic pain Pain Location: Foot Pain Orientation: Right, Left (outer aspect of both feet) Pain Descriptors / Indicators: Aching, Constant, Tingling (pain in the morning and nitetime is not tolerable) Pain Frequency: Intermittent  Date of Last Visit: 12/02/15 Service Provided on Last Visit: Evaluation (new patient)  Controlled Substance Pharmacotherapy Assessment  Analgesic: Oxycodone IR 10 mg 5 times per day in addition to hydrocodone/APAP 5/325 one tablet every 6 hours when necessary for pain. MME/day: 95 mg/day Pharmacokinetics: Onset of action (Liberation/Absorption): Within expected pharmacological parameters. (20 minutes for the oxycodone and 10 minutes for the hydrocodone) Time to Peak effect (Distribution): Timing and results are as within normal expected parameters. (30 minutes for the oxycodone and 20-30 minutes for the hydrocodone) Duration of action (Metabolism/Excretion): Within normal limits for medication. (4.5 hours for the oxycodone and  4.5 hours for the hydrocodone) Pharmacodynamics: Analgesic Effect: 100% Activity Facilitation: Medication(s) allow patient to sit, stand, walk, and do the basic ADLs Perceived Effectiveness: Described as relatively effective, allowing for increase in activities of daily living (ADL) Side-effects or Adverse reactions: None reported Monitoring: [place] PMP: Compliant with practice rules and regulations UDS Results/interpretation: The patient's last UDS was done on 12/02/2015 and it came back abnormal with unexpected labels of oxazepam and temazepam that had not been declared. In addition, there was no hydrocodone present as declared by the patient. This type of pattern would suggest that the patient has been taking more hydrocodone than prescribed as there was none present at the time of this UDS when in fact there should've been. Medication Assessment Form: Discrepancies found between patient's report and information collected Treatment compliance: Deficiencies noted and steps taken to remind the patient of the seriousness of adequate therapy compliance Risk Assessment: Aberrant Behavior: imparied control over use of medications, prescription drug misuse, non-compliance with medical instructions on the proper use of the medication, unsanctioned dose escalation, unsafe use of medication and appearance of intoxication or being "High" Substance Use Disorder (SUD) Risk Level: Very High Opioid Risk Tool (ORT) Score:  6 Moderate Risk for SUD (Score between 4-7) Depression Scale Score: PHQ-2: PHQ-2 Total Score: 6 78.6% Probability of major depressive disorder (6) PHQ-9: PHQ-9 Total Score: 24 Severe depression (20-27). (Risk: 2.4 times more likely  to use opioids for reasons other than pain control and 2.89 times more likely to use more opioids than prescribed  Pharmacologic Plan: The patient was provided with a tapering schedule to go down and discontinue the use of her medications. The patient was warned that  from that moment we start prescribing these medications to her, she is not to get him from anybody else.   Laboratory Workup  Last ED UDS: Lab Results  Component Value Date   THCU NEGATIVE 01/16/2012   PCPSCRNUR NEGATIVE 01/16/2012   MDMA NEGATIVE 01/16/2012   AMPHETMU NEGATIVE 01/16/2012   METHADONE NEGATIVE 01/16/2012    Inflammation Markers Lab Results  Component Value Date   ESRSEDRATE 1 12/07/2015   CRP 1.2* 12/07/2015    Renal Function Lab Results  Component Value Date   BUN 16 12/07/2015   CREATININE 0.72 12/07/2015   GFRAA >60 12/07/2015   GFRNONAA >60 12/07/2015    Hepatic Function Lab Results  Component Value Date   AST 21 12/07/2015   ALT 20 12/07/2015   ALBUMIN 4.4 12/07/2015    Electrolytes Lab Results  Component Value Date   NA 137 12/07/2015   K 4.4 12/07/2015   CL 107 12/07/2015   CALCIUM 9.5 12/07/2015   MG 2.0 12/07/2015    Allergies  Ms. Yeatts is allergic to tape and morphine and related.  Meds  The patient has a current medication list which includes the following prescription(s): alprazolam, vitamin d3, fluoxetine, hydrocodone-acetaminophen, lidocaine, lubiprostone, multivitamin, naloxegol oxalate, oxybutynin, temazepam, vitamin d (ergocalciferol), oxycodone, oxycodone, oxycodone, oxycodone, oxycodone, oxycodone, oxycodone, oxycodone, oxycodone, and tizanidine.  Current Outpatient Prescriptions on File Prior to Visit  Medication Sig  . ALPRAZolam (XANAX) 1 MG tablet Take 1 tablet (1 mg total) by mouth 4 (four) times daily.  . Cholecalciferol (VITAMIN D3) 2000 units capsule Take 1 capsule (2,000 Units total) by mouth daily.  Marland Kitchen FLUoxetine (PROZAC) 20 MG capsule TAKE 1 CAPSULE BY MOUTH EVERY DAY  . HYDROcodone-acetaminophen (NORCO/VICODIN) 5-325 MG tablet Take 1 tablet by mouth every 6 (six) hours as needed for moderate pain.  Marland Kitchen lidocaine (LIDODERM) 5 % Place 1 patch onto the skin daily. Remove & Discard patch within 12 hours or as  directed by MD  . lubiprostone (AMITIZA) 24 MCG capsule Take 1 capsule (24 mcg total) by mouth 2 (two) times daily with a meal.  . Multiple Vitamin (MULTIVITAMIN) tablet Take 1 tablet by mouth daily.  . naloxegol oxalate (MOVANTIK) 25 MG TABS tablet Take 1 tablet (25 mg total) by mouth daily.  Marland Kitchen oxybutynin (DITROPAN XL) 10 MG 24 hr tablet Take 1 tablet (10 mg total) by mouth daily.  . temazepam (RESTORIL) 15 MG capsule TAKE 1-2 CAPSULES BY MOUTH EVERY NIGHT AT BEDTIME  . Vitamin D, Ergocalciferol, (DRISDOL) 50000 units CAPS capsule Take 1 capsule (50,000 Units total) by mouth 2 (two) times a week. X 6 weeks.   No current facility-administered medications on file prior to visit.    ROS  Constitutional: Afebrile, no chills, well hydrated and well nourished Gastrointestinal: negative Musculoskeletal:negative Neurological: negative Behavioral/Psych: negative  PFSH  Medical:  Ms. Provins  has a past medical history of Menopausal state; Anxiety; Depression; Bipolar 1 disorder ([place]); Fibromyalgia; Arthritis; Glaucoma; and Obesity (BMI 30.0-34.9). Family: family history includes Cancer in her mother; Coronary artery disease in her mother; Diabetes in her mother; Parkinson's disease in her father. There is no history of Kidney disease. Surgical:  has past surgical history that includes Appendectomy; Breast surgery (Bilateral, implants  and removal); Spine surgery (L4-5); and Fracture surgery (Right, 2014). Tobacco:  reports that she quit smoking about 26 years ago. She has never used smokeless tobacco. Alcohol:  reports that she drinks alcohol. Drug:  reports that she does not use illicit drugs.  Physical Exam  Vitals:  Today's Vitals   12/29/15 1345 12/29/15 1347  BP: 109/76   Pulse: 82   Temp: 98.4 F (36.9 C)   TempSrc: Oral   Resp: 16   Height: 5\' 4"  (1.626 m)   Weight: 172 lb (78.019 kg)   SpO2: 98%   PainSc: 6  6   PainLoc: Foot     Calculated BMI: Body mass index is 29.51  kg/(m^2). Overweight (25-29.9 kg/m2) - 20% higher incidence of chronic pain  General appearance: cooperative, appears stated age, no distress and with behavioral evidence of being under the influence of narcotics. Eyes: PERLA Respiratory: No evidence respiratory distress, no audible rales or ronchi and no use of accessory muscles of respiration  Lumbar Spine Inspection: No gross anomalies detected Alignment: Symetrical ROM: Decreased  Gait: WNL  Lower Extremities Inspection: No gross anomalies detected ROM: Adequate Sensory:  Normal Motor: Unremarkable  Assessment & Plan  Primary Diagnosis & Pertinent Problem List: The primary encounter diagnosis was Chronic pain. 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  Visit Diagnosis: 1. Chronic pain   2. Chronic low back pain (Location of Primary Source of Pain) (Bilateral) (L>R)   3. 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 Opiate use (95 MME/Day)   5. Opioid-induced constipation (OIC)   6. Lumbar facet syndrome   7. Long term current use of opiate analgesic   8. Encounter for therapeutic drug level monitoring   9. Depression   10. Chronic prescription benzodiazepine use     Problem-specific Plan(s): No problem-specific assessment & plan notes found for this encounter.   Plan of Care  Pharmacotherapy (Medications Ordered): Meds ordered this encounter  Medications  . oxyCODONE (OXY IR/ROXICODONE) 5 MG immediate release tablet    Sig: Take 2 tablets by mouth 4 times a day and 1 tablet by mouth once a day 7 days (total: 9 tablets per day)    Dispense:   63 tablet    Refill:  0    Do not place this medication, or any other prescription from our practice, on "Automatic Refill". Patient may have prescription filled one day early if pharmacy is closed on scheduled refill date. Do not fill until: 12/28/15 To last until: 01/04/16  . oxyCODONE (OXY IR/ROXICODONE) 5 MG immediate release tablet    Sig: Take 2 tablets 4 times a day 7 days (total: 8 tablets per day)    Dispense:  56 tablet    Refill:  0    Do not place this medication, or any other prescription from our practice, on "Automatic Refill". Patient may have prescription filled one day early if pharmacy is closed on scheduled refill date. Do not fill until: 01/04/16 To last until: 01/11/16  . oxyCODONE (OXY IR/ROXICODONE) 5 MG immediate release tablet    Sig: Take 2 tablets 3 times a day and 1 tablet once a day for 7 days (total: 7 tablets  per day)    Dispense:  49 tablet    Refill:  0    Do not place this medication, or any other prescription from our practice, on "Automatic Refill". Patient may have prescription filled one day early if pharmacy is closed on scheduled refill date. Do not fill until: 01/11/16 To last until: 01/18/16  . oxyCODONE (OXY IR/ROXICODONE) 5 MG immediate release tablet    Sig: Take 2 tablets by mouth 3 times a day for 7 days (total: 6 tablets per day)    Dispense:  42 tablet    Refill:  0    Do not place this medication, or any other prescription from our practice, on "Automatic Refill". Patient may have prescription filled one day early if pharmacy is closed on scheduled refill date. Do not fill until: 01/18/16 To last until: 01/25/16  . oxyCODONE (OXY IR/ROXICODONE) 5 MG immediate release tablet    Sig: Take 1 tablet 5 times a day for 7 days (total: 5 tablets per day)    Dispense:  35 tablet    Refill:  0    Do not place this medication, or any other prescription from our practice, on "Automatic Refill". Patient may have prescription filled one day early if  pharmacy is closed on scheduled refill date. Do not fill until: 01/25/16 To last until: 02/01/16  . oxyCODONE (OXY IR/ROXICODONE) 5 MG immediate release tablet    Sig: Take 1 tablet by mouth 4 times a day for 7 days (total: 4 tablets per day)    Dispense:  28 tablet    Refill:  0    Do not place this medication, or any other prescription from our practice, on "Automatic Refill". Patient may have prescription filled one day early if pharmacy is closed on scheduled refill date. Do not fill until: 02/01/16 To last until: 02/08/16  . oxyCODONE (OXY IR/ROXICODONE) 5 MG immediate release tablet    Sig: Take 1 tablet by mouth 3 times a day for 7 days (total: 3 tablets per day)    Dispense:  21 tablet    Refill:  0    Do not place this medication, or any other prescription from our practice, on "Automatic Refill". Patient may have prescription filled one day early if pharmacy is closed on scheduled refill date. Do not fill until: 02/08/16 To last until: 02/15/16  . oxyCODONE (OXY IR/ROXICODONE) 5 MG immediate release tablet    Sig: Take 1 tablet by mouth twice a day for 7 days (total: 2 tablets per day)    Dispense:  14 tablet    Refill:  0    Do not place this medication, or any other prescription from our practice, on "Automatic Refill". Patient may have prescription filled one day early if pharmacy is closed on scheduled refill date. Do not fill until: 02/15/16 To last until: 02/22/16  . oxyCODONE (OXY IR/ROXICODONE) 5 MG immediate release tablet    Sig: Take 1 tablet by mouth once a day for 7 days (total: 1 tablet per day)    Dispense:  7 tablet    Refill:  0    Do not place this medication, or any other prescription from our practice, on "Automatic Refill". Patient may have prescription filled one day early if pharmacy is closed on scheduled refill date. Do not fill until: 02/22/16 To last until: 02/29/16  . tiZANidine (ZANAFLEX) 2 MG tablet    Sig: Take 1 tablet (2 mg total) by mouth  every 6 (six)  hours as needed (For opioid withdrawals).    Dispense:  60 tablet    Refill:  0    Do not place this medication, or any other prescription from our practice, on "Automatic Refill". Patient may have prescription filled one day early if pharmacy is closed on scheduled refill date. Do not fill until: 02/15/16    [facility] & Procedure Ordered: Orders Placed This Encounter  Procedures  . LUMBAR FACET(MEDIAL BRANCH NERVE BLOCK) MBNB    Standing Status: Standing     Number of Occurrences: 1     Standing Expiration Date: 12/28/2016    Scheduling Instructions:     Side: Bilateral (without steroids)     Level: L2, L3, L4, L5, & S1 Medial Branch Nerve     Sedation: With Sedation.     Timeframe: PRN Procedure. Patient will call to schedule.    Order Specific Question:  Where will this procedure be performed?    Answer:  [facility]  . Ambulatory referral to Psychiatry    Referral Priority:  Routine    Referral Type:  Psychiatric    Referral Reason:  Specialty Services Required    Requested Specialty:  Psychiatry    Number of Visits Requested:  1    Imaging Ordered: AMB REFERRAL TO PSYCHIATRY  Interventional Therapies: Scheduled: None at this time. PRN Procedures: Diagnostic, bilateral, lumbar facet block under fluoroscopic guidance and IV sedation.    Referral(s) or Consult(s): Psychiatric consult for evaluation of depression and pharmacological management. The patient is interested in discontinuing the use of her benzodiazepines, but she is concerned that she cannot do this on her own.  Medications administered during this visit: Ms. Cassarino had no medications administered during this visit.  Future Appointments Date Time Provider Hope  12/30/2015 4:00 PM Guadalupe Maple, MD [facility] None  01/14/2016 9:30 AM Brayton Mars, MD [facility] None    Primary Care Physician: Golden Pop, MD Location: [facility] Facility Note by:  Kathlen Brunswick. Dossie Arbour, M.D, DABA, DABAPM, DABPM, DABIPP, FIPP  Pain Score Disclaimer: We use the NRS-11 scale. This is a self-reported, subjective measurement of pain severity with only modest accuracy. It is used primarily to identify changes within a particular patient. It must be understood that outpatient pain scales are significantly less accurate that those used for research, where they can be applied under ideal controlled circumstances with minimal exposure to variables. In reality, the score is likely to be a combination of pain intensity and pain affect, where pain affect describes the degree of emotional arousal or changes in action readiness caused by the sensory experience of pain. Factors such as social and work situation, setting, emotional state, anxiety levels, expectation, and prior pain experience may influence pain perception and show large inter-individual differences that may also be affected by time variables.

## 2015-12-29 NOTE — Progress Notes (Signed)
Safety precautions to be maintained throughout the outpatient stay will include: orient to surroundings, keep bed in low position, maintain call bell within reach at all times, provide assistance with transfer out of bed and ambulation.  

## 2015-12-29 NOTE — Progress Notes (Deleted)
   Subjective:    Patient ID: Catherine Hughes, female    DOB: 1951-07-02, 65 y.o.   MRN: [id number]  HPI    Review of Systems     Objective:   Physical Exam        Assessment & Plan:

## 2015-12-30 ENCOUNTER — Ambulatory Visit: Payer: Self-pay | Admitting: Family Medicine

## 2015-12-30 ENCOUNTER — Encounter: Payer: Self-pay | Admitting: Family Medicine

## 2015-12-30 ENCOUNTER — Telehealth: Payer: Self-pay | Admitting: Pain Medicine

## 2015-12-30 ENCOUNTER — Ambulatory Visit (INDEPENDENT_AMBULATORY_CARE_PROVIDER_SITE_OTHER): Payer: Medicare Other | Admitting: Family Medicine

## 2015-12-30 VITALS — BP 93/67 | HR 65 | Temp 98.8°F | Ht 64.7 in | Wt 172.0 lb

## 2015-12-30 DIAGNOSIS — F329 Major depressive disorder, single episode, unspecified: Secondary | ICD-10-CM | POA: Diagnosis not present

## 2015-12-30 DIAGNOSIS — F32A Depression, unspecified: Secondary | ICD-10-CM

## 2015-12-30 DIAGNOSIS — G8929 Other chronic pain: Secondary | ICD-10-CM | POA: Diagnosis not present

## 2015-12-30 NOTE — Progress Notes (Signed)
BP 93/67 mmHg  Pulse 65  Temp(Src) 98.8 F (37.1 C)  Ht 5' 4.7" (1.643 m)  Wt 172 lb (78.019 kg)  BMI 28.90 kg/m2  SpO2 97%   Subjective:    Patient ID: Catherine Hughes, female    DOB: Jan 23, 1951, 66 y.o.   MRN: [id number]  HPI: Catherine Hughes is a 65 y.o. female  Chief Complaint  Patient presents with  . medication check  . Depression    Relevant past medical, surgical, family and social history reviewed and updated as indicated. Interim medical history since our last visit reviewed. Allergies and medications reviewed and updated.  Review of Systems  Constitutional:       Feels bad since very scared with multiple changes going on the patient's pain regimen  Respiratory: Negative.   Cardiovascular: Negative.   Psychiatric/Behavioral:       Patient presents with worried about with droll in the future though very well thought out plan for avoiding withdrawal from patient's pain specialist who is managing her pain. These type questions are all deferred to patient's pain specialist. Patient's depression discussed use of Xanax      Per HPI unless specifically indicated above     Objective:    BP 93/67 mmHg  Pulse 65  Temp(Src) 98.8 F (37.1 C)  Ht 5' 4.7" (1.643 m)  Wt 172 lb (78.019 kg)  BMI 28.90 kg/m2  SpO2 97%  Wt Readings from Last 3 Encounters:  12/30/15 172 lb (78.019 kg)  12/29/15 172 lb (78.019 kg)  12/14/15 174 lb (78.926 kg)    Physical Exam  Constitutional: She is oriented to person, place, and time. She appears well-developed and well-nourished. No distress.  HENT:  Head: Normocephalic and atraumatic.  Right Ear: Hearing normal.  Left Ear: Hearing normal.  Nose: Nose normal.  Eyes: Conjunctivae and lids are normal. Right eye exhibits no discharge. Left eye exhibits no discharge. No scleral icterus.  Cardiovascular: Normal rate, regular rhythm and normal heart sounds.   Pulmonary/Chest: Effort normal and breath sounds normal. No respiratory  distress.  Musculoskeletal: Normal range of motion.  Neurological: She is alert and oriented to person, place, and time.  Skin: Skin is intact. No rash noted.  Psychiatric: She has a normal mood and affect. Her speech is normal and behavior is normal. Judgment and thought content normal. Cognition and memory are normal.    Results for orders placed or performed in visit on 12/14/15  UA/M w/rflx Culture, Routine  Result Value Ref Range   Specific Gravity, UA 1.025 1.005 - 1.030   pH, UA 5.0 5.0 - 7.5   Color, UA Yellow Yellow   Appearance Ur Cloudy (A) Clear   Leukocytes, UA 1+ (A) Negative   Protein, UA Negative Negative/Trace   Glucose, UA Negative Negative   Ketones, UA Negative Negative   RBC, UA Trace (A) Negative   Bilirubin, UA Negative Negative   Urobilinogen, Ur 0.2 0.2 - 1.0 mg/dL   Nitrite, UA Negative Negative   Urinalysis Reflex Comment   Urine Culture, Routine  Result Value Ref Range   Urine Culture, Routine Final report    Urine Culture result 1 Comment       Assessment & Plan:   Problem List Items Addressed This Visit      Other   Chronic pain - Primary (Chronic)    Discussed chronic pain and deferring all questions and management issues to pain specialty clinic      Depression  Discussed depression and Xanax use will continue Prozac encourage psychiatry referral to further manage patient's Xanax and depression. Discussed I will do a very slow Xanax withdrawal reducing dosing by 0.5 mg a month.          Reviewed Xanax and reviewed [place] drug registry patient last filled February 22 for 120 discussed tapering dose as described above  Follow up plan: Return in about 2 months (around 02/29/2016) for Xanax reevaluation.

## 2015-12-30 NOTE — Telephone Encounter (Signed)
Attempted to call patient.  Patients phone does not take messages.

## 2015-12-30 NOTE — Telephone Encounter (Signed)
Says she was supposed to get some scripts sent to pharmacy and did not get them, please call patient to discuss

## 2015-12-30 NOTE — Assessment & Plan Note (Signed)
Discussed depression and Xanax use will continue Prozac encourage psychiatry referral to further manage patient's Xanax and depression. Discussed I will do a very slow Xanax withdrawal reducing dosing by 0.5 mg a month.

## 2015-12-30 NOTE — Assessment & Plan Note (Signed)
Discussed chronic pain and deferring all questions and management issues to pain specialty clinic

## 2015-12-31 ENCOUNTER — Telehealth: Payer: Self-pay

## 2015-12-31 NOTE — Telephone Encounter (Signed)
Pt says Dr. Dossie Arbour wrote pt a script for zanaflex but it says not to fill until 02/15/2016.. Was this a mistake pt needs this for the withdrawal she will be going through. Pt needs a script for muscle relaxer.

## 2016-01-02 ENCOUNTER — Other Ambulatory Visit: Payer: Self-pay | Admitting: Family Medicine

## 2016-01-04 ENCOUNTER — Other Ambulatory Visit: Payer: Self-pay | Admitting: Pain Medicine

## 2016-01-04 NOTE — Telephone Encounter (Signed)
Dr Dossie Arbour gave orders to call in Tizanidine 2 mg 1 tablet tid #90 with no refills.  Patient notified.

## 2016-01-05 ENCOUNTER — Other Ambulatory Visit: Payer: Self-pay | Admitting: Family Medicine

## 2016-01-07 ENCOUNTER — Ambulatory Visit: Payer: Self-pay | Admitting: Family Medicine

## 2016-01-09 ENCOUNTER — Other Ambulatory Visit: Payer: Self-pay | Admitting: Family Medicine

## 2016-01-11 ENCOUNTER — Telehealth: Payer: Self-pay | Admitting: Family Medicine

## 2016-01-11 NOTE — Telephone Encounter (Signed)
Called into [facility] at 11:10am 01/11/16.

## 2016-01-11 NOTE — Telephone Encounter (Signed)
Pt called and stated that she is unable to come pick up her rx for xanax and would like to have it called in to [facility] so her husband can pick it up on his way in from work.

## 2016-01-14 ENCOUNTER — Encounter: Payer: Self-pay | Admitting: Obstetrics and Gynecology

## 2016-01-20 NOTE — Telephone Encounter (Signed)

## 2016-02-08 ENCOUNTER — Other Ambulatory Visit: Payer: Self-pay | Admitting: Family Medicine

## 2016-02-08 NOTE — Telephone Encounter (Signed)
Pt called stated she needs a refill on Temazepam. Pharm is [facility] in [place]. Thanks.

## 2016-02-08 NOTE — Telephone Encounter (Signed)
Call pt 

## 2016-02-08 NOTE — Telephone Encounter (Signed)
temazepam (RESTORIL) 15 MG capsule Pharmacy: [facility] in [place], [place]  Patient needs refill on her medication, thanks.

## 2016-02-08 NOTE — Telephone Encounter (Signed)
This medication is written by Dr. Dossie Arbour -   She will have to call him to have filled, last Rx states not refillable until 02/15/16

## 2016-02-09 ENCOUNTER — Other Ambulatory Visit: Payer: Self-pay | Admitting: Family Medicine

## 2016-02-09 ENCOUNTER — Telehealth: Payer: Self-pay | Admitting: Family Medicine

## 2016-02-09 MED ORDER — TEMAZEPAM 15 MG PO CAPS: 15.0000 mg | ORAL_CAPSULE | Freq: Every evening | ORAL | Status: DC | PRN

## 2016-02-09 NOTE — Telephone Encounter (Signed)
temazepam (RESTORIL) 15 MG capsule Pharmacy: [id number] - [place], [place]  Patient needs refill on her medication sent to her pharmacy, thanks.

## 2016-02-09 NOTE — Telephone Encounter (Signed)
Patient is on call list for [facility] to discuss this refill

## 2016-02-09 NOTE — Progress Notes (Signed)
Phone call Discussed with patient age and stopping and tapering narcotics is having a very tough time discuss importance of getting off benzo medications as well. Will not stack patient's misery continue Restoril for 3 weeks and then changed to run 15 mg.

## 2016-03-02 ENCOUNTER — Telehealth: Payer: Self-pay

## 2016-03-02 NOTE — Telephone Encounter (Signed)
patient needs an evaluation appointment to talk with Dr. Dossie Arbour about medications. Transferred to [facility] to make appointment.

## 2016-03-02 NOTE — Telephone Encounter (Signed)
Pt wants to know if methadone will make her feel better

## 2016-03-03 ENCOUNTER — Telehealth: Payer: Self-pay | Admitting: Pain Medicine

## 2016-03-03 NOTE — Telephone Encounter (Signed)
Responded to patients phone call re; needing medications.  Patient states that the Zanaflex is causing her headaches,  Dr Dossie Arbour instructed for her to stop taking the zanaflex.  Patient is asking for methadone and Dr Dossie Arbour denies this request, he does not ever prescribe methadone.  When patient is given this information she states to cancel her appt that she will not see this Doctor.  Instructed patient to call front desk for as I do not handle any scheduling,  Phone call transferred to front desk.

## 2016-03-03 NOTE — Telephone Encounter (Signed)
Patient can't wait until 03-10-16 for meds, wants script to pick up, patient was given next available appt. Wants to speak with nurse or Dr. Dossie Arbour

## 2016-03-08 ENCOUNTER — Ambulatory Visit (INDEPENDENT_AMBULATORY_CARE_PROVIDER_SITE_OTHER): Payer: Medicare Other | Admitting: Family Medicine

## 2016-03-08 ENCOUNTER — Encounter: Payer: Self-pay | Admitting: Family Medicine

## 2016-03-08 VITALS — BP 94/64 | HR 82 | Temp 98.5°F | Ht 64.7 in | Wt 165.0 lb

## 2016-03-08 DIAGNOSIS — G8929 Other chronic pain: Secondary | ICD-10-CM | POA: Diagnosis not present

## 2016-03-08 DIAGNOSIS — F329 Major depressive disorder, single episode, unspecified: Secondary | ICD-10-CM

## 2016-03-08 DIAGNOSIS — F32A Depression, unspecified: Secondary | ICD-10-CM

## 2016-03-08 MED ORDER — ALPRAZOLAM 1 MG PO TABS: ORAL_TABLET | ORAL | Status: DC

## 2016-03-08 MED ORDER — TEMAZEPAM 15 MG PO CAPS: 15.0000 mg | ORAL_CAPSULE | Freq: Every evening | ORAL | Status: DC | PRN

## 2016-03-08 NOTE — Assessment & Plan Note (Signed)
For depression anxiety chronic use of benzos will give refills for 3 months Patient will make appointment with psychiatry to further evaluate long-term use of Xanax and Restoril

## 2016-03-08 NOTE — Progress Notes (Signed)
BP 94/64 mmHg  Pulse 82  Temp(Src) 98.5 F (36.9 C)  Ht 5' 4.7" (1.643 m)  Wt 165 lb (74.844 kg)  BMI 27.73 kg/m2  SpO2 95%   Subjective:    Patient ID: Catherine Hughes, female    DOB: [birth year], 65 y.o.   MRN: [id number]  HPI: Catherine Hughes is a 65 y.o. female  Chief Complaint  Patient presents with  . med check  Patient's been through a very tough time of tapering and discontinuing narcotics had a very stormy course during this period. Patient very resentful about the pain and difficulty she went through. Patient though is now off narcotics. Patient not happy with tried to stop benzos during this period. Talk to a therapist about medications and was told she didn't need to see a psychiatrist by patient report. Wanting 3 months refills on current medications to get her through visiting family in [place] this summer. Patient tearful during this interview  Relevant past medical, surgical, family and social history reviewed and updated as indicated. Interim medical history since our last visit reviewed. Allergies and medications reviewed and updated.  Review of Systems  Constitutional: Negative.   Respiratory: Negative.   Cardiovascular: Negative.     Per HPI unless specifically indicated above     Objective:    BP 94/64 mmHg  Pulse 82  Temp(Src) 98.5 F (36.9 C)  Ht 5' 4.7" (1.643 m)  Wt 165 lb (74.844 kg)  BMI 27.73 kg/m2  SpO2 95%  Wt Readings from Last 3 Encounters:  03/08/16 165 lb (74.844 kg)  12/30/15 172 lb (78.019 kg)  12/29/15 172 lb (78.019 kg)    Physical Exam  Constitutional: She is oriented to person, place, and time. She appears well-developed and well-nourished. No distress.  HENT:  Head: Normocephalic and atraumatic.  Right Ear: Hearing normal.  Left Ear: Hearing normal.  Nose: Nose normal.  Eyes: Conjunctivae and lids are normal. Right eye exhibits no discharge. Left eye exhibits no discharge. No scleral icterus.  Cardiovascular:  Normal rate and regular rhythm.   Pulmonary/Chest: Effort normal and breath sounds normal. No respiratory distress.  Musculoskeletal: Normal range of motion.  Neurological: She is alert and oriented to person, place, and time.  Skin: Skin is intact. No rash noted.  Psychiatric: She has a normal mood and affect. Her speech is normal and behavior is normal. Judgment and thought content normal. Cognition and memory are normal.  tearful    Results for orders placed or performed in visit on 12/14/15  UA/M w/rflx Culture, Routine  Result Value Ref Range   Specific Gravity, UA 1.025 1.005 - 1.030   pH, UA 5.0 5.0 - 7.5   Color, UA Yellow Yellow   Appearance Ur Cloudy (A) Clear   Leukocytes, UA 1+ (A) Negative   Protein, UA Negative Negative/Trace   Glucose, UA Negative Negative   Ketones, UA Negative Negative   RBC, UA Trace (A) Negative   Bilirubin, UA Negative Negative   Urobilinogen, Ur 0.2 0.2 - 1.0 mg/dL   Nitrite, UA Negative Negative   Urinalysis Reflex Comment   Urine Culture, Routine  Result Value Ref Range   Urine Culture, Routine Final report    Urine Culture result 1 Comment       Assessment & Plan:   Problem List Items Addressed This Visit      Other   Chronic pain - Primary (Chronic)    Followed by pain clinic and off narcotics  Depression    For depression anxiety chronic use of benzos will give refills for 3 months Patient will make appointment with psychiatry to further evaluate long-term use of Xanax and Restoril      Relevant Medications   ALPRAZolam (XANAX) 1 MG tablet       Follow up plan: Return in about 3 months (around 06/08/2016) for Med check.

## 2016-03-08 NOTE — Assessment & Plan Note (Signed)
Followed by pain clinic and off narcotics

## 2016-03-10 ENCOUNTER — Encounter: Payer: Self-pay | Admitting: Pain Medicine

## 2016-03-10 ENCOUNTER — Ambulatory Visit: Payer: Medicare Other | Attending: Pain Medicine | Admitting: Pain Medicine

## 2016-03-10 VITALS — BP 99/59 | HR 63 | Temp 98.4°F | Resp 16 | Ht 64.0 in | Wt 164.0 lb

## 2016-03-10 DIAGNOSIS — M797 Fibromyalgia: Secondary | ICD-10-CM | POA: Insufficient documentation

## 2016-03-10 DIAGNOSIS — Z981 Arthrodesis status: Secondary | ICD-10-CM | POA: Diagnosis not present

## 2016-03-10 DIAGNOSIS — E669 Obesity, unspecified: Secondary | ICD-10-CM | POA: Insufficient documentation

## 2016-03-10 DIAGNOSIS — E559 Vitamin D deficiency, unspecified: Secondary | ICD-10-CM | POA: Insufficient documentation

## 2016-03-10 DIAGNOSIS — F32A Depression, unspecified: Secondary | ICD-10-CM

## 2016-03-10 DIAGNOSIS — R109 Unspecified abdominal pain: Secondary | ICD-10-CM | POA: Diagnosis not present

## 2016-03-10 DIAGNOSIS — M542 Cervicalgia: Secondary | ICD-10-CM | POA: Insufficient documentation

## 2016-03-10 DIAGNOSIS — Z79899 Other long term (current) drug therapy: Secondary | ICD-10-CM | POA: Diagnosis not present

## 2016-03-10 DIAGNOSIS — F319 Bipolar disorder, unspecified: Secondary | ICD-10-CM | POA: Diagnosis not present

## 2016-03-10 DIAGNOSIS — Z5181 Encounter for therapeutic drug level monitoring: Secondary | ICD-10-CM | POA: Diagnosis not present

## 2016-03-10 DIAGNOSIS — F329 Major depressive disorder, single episode, unspecified: Secondary | ICD-10-CM | POA: Insufficient documentation

## 2016-03-10 DIAGNOSIS — Z87891 Personal history of nicotine dependence: Secondary | ICD-10-CM | POA: Diagnosis not present

## 2016-03-10 DIAGNOSIS — G8929 Other chronic pain: Secondary | ICD-10-CM | POA: Insufficient documentation

## 2016-03-10 DIAGNOSIS — Z6834 Body mass index (BMI) 34.0-34.9, adult: Secondary | ICD-10-CM | POA: Diagnosis not present

## 2016-03-10 DIAGNOSIS — H409 Unspecified glaucoma: Secondary | ICD-10-CM | POA: Insufficient documentation

## 2016-03-10 DIAGNOSIS — M545 Low back pain: Secondary | ICD-10-CM | POA: Diagnosis not present

## 2016-03-10 DIAGNOSIS — Z1211 Encounter for screening for malignant neoplasm of colon: Secondary | ICD-10-CM | POA: Insufficient documentation

## 2016-03-10 DIAGNOSIS — Z79891 Long term (current) use of opiate analgesic: Secondary | ICD-10-CM | POA: Diagnosis not present

## 2016-03-10 DIAGNOSIS — Z01818 Encounter for other preprocedural examination: Secondary | ICD-10-CM | POA: Insufficient documentation

## 2016-03-10 MED ORDER — VITAMIN D3 50 MCG (2000 UT) PO CAPS: ORAL_CAPSULE | ORAL | Status: DC

## 2016-03-10 MED ORDER — VITAMIN D (ERGOCALCIFEROL) 1.25 MG (50000 UNIT) PO CAPS: ORAL_CAPSULE | ORAL | Status: DC

## 2016-03-10 MED ORDER — OVER THE COUNTER MEDICATION: Status: DC

## 2016-03-10 NOTE — Progress Notes (Signed)
Patient's Name: Catherine Hughes  Patient type: Established  MRN: [id number]  Service setting: Ambulatory outpatient  DOB: May 17, 1951  Location: [facility]  DOS: 03/10/2016  Primary Care Physician: Golden Pop, MD  Note by: Kathlen Brunswick. Dossie Arbour, M.D, DABA, DABAPM, DABPM, DABIPP, FIPP  Referring Physician: Guadalupe Maple, MD  Specialty: Board-Certified Interventional Pain Management  Last Visit to Pain Management: 03/03/2016   Primary Reason(s) for Visit: Encounter for prescription drug management (Level of risk: moderate) CC: Neck Pain and Abdominal Pain   HPI  Catherine Hughes is a 65 y.o. year old, female patient, who returns today as an established patient. 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 Her primarily concern today is the Neck Pain and Abdominal Pain   Pain Assessment: Self-Reported Pain Score: 9 , clinically she looks like a 3/10. Reported level is inconsistent with clinical obrservations. Today we have  given her some information regarding the pain score and how to properly use it. Pain Type: Chronic pain Pain Location: Abdomen Pain Descriptors / Indicators: Aching ("Feels like an Alien is in her stomach eating it") Pain Frequency: Constant  The patient comes into the clinics today for pharmacological management of her chronic pain. I last saw this patient on 03/03/2016. The patient  reports that she does not use illicit drugs. Her body mass index is 28.14 kg/(m^2). By now, the patient has been off of the pain medicine for at least 10 days. During the last portion of her medication tapering she called [place] several times clearly anxious about coming off of the medicine. She indicated that she was having some difficulties with the device and ending which we told her that she needed to stop. She indicates that it made her age, gave her headaches, and abdominal pain. When I asked her about the abdominal pain, I would seem that she started taking some NSAIDs and this could account for some gastritis and the abdominal discomfort. In any case, despite the fact that she told me that she was having all the side effects, when I told her that she simply needed to come off of the medicine she said that she could not. I really couldn't get a clear logical answer as to why she couldn't except that she felt that she needed to be on "something".  Because the patient's PHQ was high today, I told her that I would get her a referral to a psychiatrist. Once again, she started then telling me that she was going to be traveling and that she couldn't do this and kept on giving me a whole bunch of excuses. Today she also attempted to have me prescribed some pain medication for her by trying to give me sad story  as to all the things that she needed to do but was not being able to do, including feeding herself. Clearly from what I'm seen here in the clinic this patient has more than capable to feed herself and primarily what I'm  looking that is somebody that is having a lot of psychiatric issues. She even used the term "emotional pain" to describe some of her illnesses. Clearly, because this is not within the realm of what we treat I told her that I would not be prescribing any pain medication for that. When the patient first came to see me on 12/02/2015 she indicated that all she wanted was some assistance in coming off of her pain medication. She indicated to me that she was not interested in me prescribing any kind of pain medicine or to do any injections for her. Now that we have accomplished her goal she is trying to manipulate me into putting her back on the pain medicine including the use of psychological tactics such as telling me that I do not care for her or any of my patients. Unfortunately for her, this is something that we are used to and therefore it did not work on [place]. He stated, what I did today was look at her case and offer her some interventional options to treat her pain all of which she turned down once she realized that I was not going to restart her on any pain medication.  I also talked to her about her vitamin D deficiency and make sure that she had some prescriptions to replace her vitamin D. In addition, we talked about TENS units, nerve blocks, especially greater occipital nerve blocks, as well as radiofrequency ablation alternatives.   Date of Last Visit: 12/30/15 Service Provided on Last Visit: Evaluation (Drug holiday started)  Controlled Substance Pharmacotherapy Assessment & REMS (Risk Evaluation and Mitigation Strategy)  Analgesic: The patient was previously taking Oxycodone IR 10 mg 5 times per day in addition to hydrocodone/APAP 5/325 one tablet every 6 hours when necessary for pain. We have been tapering hit her medications down and according to our tapering schedule she should've finished taking her last medication on 02/29/2016. She has been 10 days off of all of her opioids, assuming that she  has actually done this right. At this point she is not taking any opioids and based on what I have learned from this patient, it is not likely that I will be putting her on any in the near future. Pill Count: The patient did not bring any pills or empty bottles. MME/day: Started at 95 mg/day. Currently she is back to 0 mg per day. Pharmacokinetics: Onset of action (Liberation/Absorption): Within expected pharmacological parameters Time to Peak effect (Distribution): Timing and results are as within normal expected parameters Duration of action (Metabolism/Excretion): Within normal limits for medication Pharmacodynamics: Analgesic Effect: More than 50% Activity Facilitation: Medication(s) allow patient to sit, stand, walk, and do the basic ADLs Perceived Effectiveness: Described as relatively effective, allowing for increase in activities of daily living (ADL) Side-effects or Adverse reactions: None reported Monitoring: [place] PMP: Online review of the past 28-month period conducted. Compliant with practice rules and regulations Last UDS on record: TOXASSURE SELECT 13  Date Value Ref Range Status  12/02/2015 FINAL  Final    Comment:    ==================================================================== TOXASSURE SELECT 13 (MW) ==================================================================== Test  Result       Flag       Units Drug Present and Declared for Prescription Verification   Alprazolam                     443          EXPECTED   ng/mg creat   Alpha-hydroxyalprazolam        682          EXPECTED   ng/mg creat    Source of alprazolam is a scheduled prescription medication.    Alpha-hydroxyalprazolam is an expected metabolite of alprazolam.   Oxycodone                      >5917        EXPECTED   ng/mg creat   Oxymorphone                    327          EXPECTED   ng/mg creat   Noroxycodone                   >5917        EXPECTED   ng/mg creat    Noroxymorphone                 66           EXPECTED   ng/mg creat    Sources of oxycodone are scheduled prescription medications.    Oxymorphone, noroxycodone, and noroxymorphone are expected    metabolites of oxycodone. Oxymorphone is also available as a    scheduled prescription medication. Drug Present not Declared for Prescription Verification   Oxazepam                       1764         UNEXPECTED ng/mg creat   Temazepam                      >5917        UNEXPECTED ng/mg creat    Oxazepam and temazepam are expected metabolites of diazepam.    Oxazepam is also an expected metabolite of other benzodiazepine    drugs, including chlordiazepoxide, prazepam, clorazepate,    halazepam, and temazepam.  Oxazepam and temazepam are available    as scheduled prescription medications. Drug Absent but Declared for Prescription Verification   Hydrocodone                    Not Detected UNEXPECTED ng/mg creat ==================================================================== Test                      Result    Flag   Units      Ref Range   Creatinine              169              mg/dL      >=20 ==================================================================== Declared Medications:  The flagging and interpretation on this report are based on the  following declared medications.  Unexpected results may arise from  inaccuracies in the declared medications.  **Note: The testing scope of this panel includes these medications:  Alprazolam (Xanax)  Hydrocodone  Oxycodone ==================================================================== For clinical consultation, please call [phone]. ====================================================================    UDS interpretation: Unexpected findings: Oxazepam and temazepam present as possible metabolites of diazepam. Patient  informed of the CDC guidelines and the risk associated with the use of benzodiazepines in combination with  opioids. Medication Assessment Form: Reviewed. Patient indicates being compliant with therapy Treatment compliance: Compliant Risk Assessment: Aberrant Behavior: diminised ability to recongnize a problem with one's behavior or  use of the medication, dysfunctional emotional responses, claims that "nothing else works", extensive time discussing medication  and repeated negotiations to obtain more medication Substance Use Disorder (SUD) Risk Level: Very High Risk of opioid abuse or dependence: 0.7-3.0% with doses ? 36 MME/day and 6.1-26% with doses ? 120 MME/day. Opioid Risk Tool (ORT) Score: Total Score: 6 Moderate Risk for SUD (Score between 4-7) Depression Scale Score: PHQ-2: PHQ-2 Total Score: 6 78.6% Probability of major depressive disorder (6) PHQ-9: PHQ-9 Total Score: 22 Severe depression (20-27). (Risk: 2.4 times more likely to use opioids for reasons other than pain control and 2.89 times more likely to use more opioids than prescribed  Pharmacologic Plan: Discontinue opioid therapy. Interventional therapy options only.  Laboratory Chemistry  Inflammation Markers Lab Results  Component Value Date   ESRSEDRATE 1 12/07/2015   CRP 1.2* 12/07/2015    Renal Function Lab Results  Component Value Date   BUN 16 12/07/2015   CREATININE 0.72 12/07/2015   GFRAA >60 12/07/2015   GFRNONAA >60 12/07/2015    Hepatic Function Lab Results  Component Value Date   AST 21 12/07/2015   ALT 20 12/07/2015   ALBUMIN 4.4 12/07/2015    Electrolytes Lab Results  Component Value Date   NA 137 12/07/2015   K 4.4 12/07/2015   CL 107 12/07/2015   CALCIUM 9.5 12/07/2015   MG 2.0 12/07/2015    Pain Modulating Vitamins Lab Results  Component Value Date   VD25OH 8.9* 12/07/2015   VD125OH2TOT 59.3 12/07/2015    Coagulation Parameters Lab Results  Component Value Date   INR 1.0 01/16/2012   LABPROT 13.3 01/16/2012    Note: I personally reviewed the above data. Results made available  to patient.  Recent Diagnostic Imaging  Dg Lumbar Spine Complete W/bend  12/07/2015  CLINICAL DATA:  History of lumbar fusion 2009. Continuing low back pain. EXAM: LUMBAR SPINE - COMPLETE WITH BENDING VIEWS COMPARISON:  None. FINDINGS: Ray cage interbody fusion changes noted at L4-5 and L5-S1. No complicating features are demonstrated. Normal alignment of the lumbar vertebral bodies. The other intervertebral disc spaces are maintained. No range of motion is demonstrated with flexion or extension. Incidental note is made of partial L5-S1 fusion on the left side. The visualized bony pelvis is intact. IMPRESSION: Interbody Ray cage fusion at L4-5 and [id number] without complicating features. No acute bony findings or destructive bony changes. No range of motion demonstrated with flexion/extension. Electronically Signed   By: Marijo Sanes M.D.   On: 12/07/2015 15:29    Meds  The patient has a current medication list which includes the following prescription(s): alprazolam, fluoxetine, lidocaine, multivitamin, oxybutynin, temazepam, vitamin d3, OVER THE COUNTER MEDICATION, and vitamin d (ergocalciferol).  Current Outpatient Prescriptions on File Prior to Visit  Medication Sig  . ALPRAZolam (XANAX) 1 MG tablet Reduce dose to 3 and 1/2 pills a day  . FLUoxetine (PROZAC) 20 MG capsule TAKE 1 CAPSULE BY MOUTH EVERY DAY  . lidocaine (LIDODERM) 5 % Place 1 patch onto the skin daily. Remove & Discard patch within 12 hours or as directed by MD  . Multiple Vitamin (MULTIVITAMIN) tablet Take 1 tablet by mouth daily.  Marland Kitchen oxybutynin (DITROPAN XL) 10 MG 24 hr  tablet Take 1 tablet (10 mg total) by mouth daily.  . temazepam (RESTORIL) 15 MG capsule Take 1-2 capsules (15-30 mg total) by mouth at bedtime as needed for sleep.   No current facility-administered medications on file prior to visit.    ROS  Constitutional: Denies any fever or chills Gastrointestinal: No reported hemesis, hematochezia, vomiting, or acute GI  distress Musculoskeletal: Denies any acute onset joint swelling, redness, loss of ROM, or weakness Neurological: No reported episodes of acute onset apraxia, aphasia, dysarthria, agnosia, amnesia, paralysis, loss of coordination, or loss of consciousness  Allergies  Catherine Hughes is allergic to tape and morphine and related.  [facility]  Medical:  Catherine Hughes  has a past medical history of Menopausal state; Anxiety; Depression; Bipolar 1 disorder ([place]); Fibromyalgia; Arthritis; Glaucoma; Obesity (BMI 30.0-34.9); and Pneumonia. Family: family history includes Cancer in her mother; Coronary artery disease in her mother; Diabetes in her mother; Parkinson's disease in her father. There is no history of Kidney disease. Surgical:  has past surgical history that includes Appendectomy; Breast surgery (Bilateral, implants and removal); Spine surgery (L4-5); and Fracture surgery (Right, 2014). Tobacco:  reports that she quit smoking about 26 years ago. She has never used smokeless tobacco. Alcohol:  reports that she drinks alcohol. Drug:  reports that she does not use illicit drugs.  Constitutional Exam  Vitals: Blood pressure 99/59, pulse 63, temperature 98.4 F (36.9 C), temperature source Oral, resp. rate 16, height 5\' 4"  (1.626 m), weight 164 lb (74.39 kg), SpO2 99 %. General appearance: Well nourished, well developed, and well hydrated. In no acute distress Calculated BMI/Body habitus: Body mass index is 28.14 kg/(m^2). (25-29.9 kg/m2) Overweight - 20% higher incidence of chronic pain Psych/Mental status: Alert and oriented x 3 (person, place, & time) Eyes: PERLA Respiratory: No evidence of acute respiratory distress  Cervical Spine Exam  Inspection: No masses, redness, or swelling Alignment: Symmetrical ROM: Functional: ROM is within functional limits [facility]) Stability: No instability detected Muscle strength & Tone: Functionally intact Sensory: Unimpaired Palpation: Positive reproduction of the  patient's pain upon putting pressure over the greater occipital nerve, bilaterally.  Upper Extremity (UE) Exam    Side: Right upper extremity  Side: Left upper extremity  Inspection: No masses, redness, swelling, or asymmetry  Inspection: No masses, redness, swelling, or asymmetry  ROM:  ROM:  Functional: ROM is within functional limits [facility])  Functional: ROM is within functional limits [facility])  Muscle strength & Tone: Functionally intact  Muscle strength & Tone: Functionally intact  Sensory: Unimpaired  Sensory: Unimpaired  Palpation: Non-contributory  Palpation: Non-contributory   Thoracic Spine Exam  Inspection: No masses, redness, or swelling Alignment: Symmetrical ROM: Functional: ROM is within functional limits [facility]) Stability: No instability detected Sensory: Unimpaired Muscle strength & Tone: Functionally intact Palpation: No complaints of tenderness  Lumbar Spine Exam  Inspection: No masses, redness, or swelling Alignment: Symmetrical ROM: Functional: ROM is within functional limits [facility]) Stability: No instability detected Muscle strength & Tone: Functionally intact Sensory: Unimpaired Palpation: No complaints of tenderness Provocative Tests: Lumbar Hyperextension and rotation test: deferred Patrick's Maneuver: deferred  Gait & Posture Assessment  Ambulation: Unassisted Gait: Unaffected Posture: Poor  Lower Extremity Exam    Side: Right lower extremity  Side: Left lower extremity  Inspection: No masses, redness, swelling, or asymmetry ROM:  Inspection: No masses, redness, swelling, or asymmetry ROM:  Functional: ROM is within functional limits [facility])  Functional: ROM is within functional limits [facility])  Muscle strength & Tone: Functionally intact  Muscle strength & Tone: Functionally intact  Sensory: Unimpaired  Sensory: Unimpaired  Palpation: Non-contributory  Palpation: Non-contributory   Assessment & Plan  Primary Diagnosis & Pertinent Problem List: The  primary encounter diagnosis was Chronic pain. Diagnoses of Long term current use of opiate analgesic, Chronic prescription benzodiazepine use, Encounter for therapeutic drug level monitoring, Depression, and Vitamin D deficiency were also pertinent to this visit.  Visit Diagnosis: 1. Chronic pain   2. Long term current use of opiate analgesic   3. Chronic prescription benzodiazepine use   4. Encounter for therapeutic drug level monitoring   5. Depression   6. Vitamin D deficiency     Problems updated and reviewed during this visit: Problem  Encounter for Therapeutic Drug Level Monitoring    Problem-specific Plan(s): No problem-specific assessment & plan notes found for this encounter.  No new assessment & plan notes have been filed under this hospital service since the last note was generated. Service: Pain Management   Plan of Care  The patient indicated that she does not intend to return back to see me as she believes that are interests are not aligned.  Problem List Items Addressed This Visit      High   Chronic pain - Primary (Chronic)     Medium   Chronic prescription benzodiazepine use   Encounter for therapeutic drug level monitoring   Long term current use of opiate analgesic (Chronic)   Relevant Orders   ToxASSURE Select 13 (MW), Urine     Low   Depression   Relevant Orders   Ambulatory referral to Psychiatry   Vitamin D deficiency   Relevant Medications   Vitamin D, Ergocalciferol, (DRISDOL) 50000 units CAPS capsule   Cholecalciferol (VITAMIN D3) 2000 units capsule   OVER THE COUNTER MEDICATION       Pharmacotherapy (Medications Ordered): Meds ordered this encounter  Medications  . Vitamin D, Ergocalciferol, (DRISDOL) 50000 units CAPS capsule    Sig: Take 1 capsule (50,000 Units total) by mouth 2 (two) times a week. X 6 weeks.    Dispense:  12 capsule    Refill:  0    Do not place this medication, or any other prescription from our practice, on  "Automatic Refill".  . Cholecalciferol (VITAMIN D3) 2000 units capsule    Sig: Take 1 capsule (2,000 Units total) by mouth daily.    Dispense:  30 capsule    Refill:  PRN    Do not place this medication, or any other prescription from our practice, on "Automatic Refill".  . OVER THE COUNTER MEDICATION    Sig: Calcium 1200 mg plus Vitamin D3 1000 IU (Nature's Bounty) Take two (2) Softgels daily, preferably with meals    Dispense:  1 Bottle    Refill:  PRN    This is an OTC product. This prescription is to serve as a reminder to the patient as to our preference.    Lab-work & Procedure Ordered: Orders Placed This Encounter  Procedures  . ToxASSURE Select 13 (MW), Urine  . Ambulatory referral to Psychiatry    Imaging Ordered: AMB REFERRAL TO PSYCHIATRY. The patient left without getting her referral.  Interventional Therapies: Scheduled:  None at this time    Considering:  Diagnostic bilateral greater occipital nerve block without steroids, under fluoroscopic guidance and IV sedation.    PRN Procedures:  None at this time.    Referral(s) or Consult(s): None at this time.  New Prescriptions   CHOLECALCIFEROL (VITAMIN D3) 2000 UNITS  CAPSULE    Take 1 capsule (2,000 Units total) by mouth daily.   OVER THE COUNTER MEDICATION    Calcium 1200 mg plus Vitamin D3 1000 IU (Nature's Bounty) Take two (2) Softgels daily, preferably with meals   VITAMIN D, ERGOCALCIFEROL, (DRISDOL) 50000 UNITS CAPS CAPSULE    Take 1 capsule (50,000 Units total) by mouth 2 (two) times a week. X 6 weeks.    Medications administered during this visit: Catherine Hughes had no medications administered during this visit.  Requested PM Follow-up: Return if symptoms worsen or fail to improve.  Future Appointments Date Time Provider [place]  03/11/2016 10:45 AM Bjorn Loser, MD [facility] None    Primary Care Physician: Golden Pop, MD Location: [facility] Note by:  Kathlen Brunswick. Dossie Arbour, M.D, DABA, DABAPM, DABPM, DABIPP, FIPP  Pain Score Disclaimer: We use the NRS-11 scale. This is a self-reported, subjective measurement of pain severity with only modest accuracy. It is used primarily to identify changes within a particular patient. It must be understood that outpatient pain scales are significantly less accurate that those used for research, where they can be applied under ideal controlled circumstances with minimal exposure to variables. In reality, the score is likely to be a combination of pain intensity and pain affect, where pain affect describes the degree of emotional arousal or changes in action readiness caused by the sensory experience of pain. Factors such as social and work situation, setting, emotional state, anxiety levels, expectation, and prior pain experience may influence pain perception and show large inter-individual differences that may also be affected by time variables.  Patient instructions provided during this appointment: There are no Patient Instructions on file for this visit.

## 2016-03-10 NOTE — Progress Notes (Signed)
Safety precautions to be maintained throughout the outpatient stay will include: orient to surroundings, keep bed in low position, maintain call bell within reach at all times, provide assistance with transfer out of bed and ambulation.  

## 2016-03-11 ENCOUNTER — Ambulatory Visit (INDEPENDENT_AMBULATORY_CARE_PROVIDER_SITE_OTHER): Payer: Medicare Other | Admitting: Urology

## 2016-03-11 VITALS — Ht 64.0 in | Wt 164.8 lb

## 2016-03-11 DIAGNOSIS — N39498 Other specified urinary incontinence: Secondary | ICD-10-CM

## 2016-03-11 NOTE — Progress Notes (Signed)
03/11/2016 11:07 AM   Catherine Hughes [birth year] [id number]  Referring provider: Guadalupe Maple, MD [address] [place], [place] [address]  Chief Complaint  Patient presents with  . Follow-up    incontinence, abdominal pain    HPI: The patient is a 65 year old woman referred by [facility] with urinary incontinence. She is mixed stress urge incontinence 70% improved on oxybutynin. She stopped Estrace a few days ago and does not think it helped a lot  She is most bothered by her leaking with coughing and sneezing but the amount is variable. She sometimes leaks with any lifting. She sometimes has urge incontinence now on oxybutynin. She is to get up 3-4 times a night but now it's once. She voids every to 3 hours during the day. She denies enuresis except on 2 rare occasions  She takes oxycodone for chronic leg and back pain as had lower back surgery. She's not had a hysterectomy. She's not had a stroke. She had not had previous GU surgery or urinary tract infections.  It was documented that her bladder was painful though she said is completely gone on oxybutynin  When the patient was here last time she had no stress incontinence. Her all right the bladder symptoms and nighttime frequency had improved. Having said that she was still having foot on the floor syndrome and urge incontinence on the oxybutynin.   With further questioning on the last visit the patient was having a lot of foot on the floor syndrome and urge incontinence prior to oxybutynin. She was wearing pads but is now pad free. From a quality of life standpoint I think she is doing well and all think surgery would be in her best interest at this stage. Her son was here today since the patient tells me she has some memory issues. Oxybutynin renewed with 90 tablets and 3 refills. Reassess durability in 4 months. I could not justify recommending urodynamics and surgery with her current degree of stress  incontinence     PMH: Past Medical History  Diagnosis Date  . Menopausal state   . Anxiety   . Depression   . Bipolar 1 disorder ([place])   . Fibromyalgia   . Arthritis   . Glaucoma   . Obesity (BMI 30.0-34.9)   . Pneumonia     Surgical History: Past Surgical History  Procedure Laterality Date  . Appendectomy    . Breast surgery Bilateral implants and removal  . Spine surgery  L4-5    fusion and coil surgery  . Fracture surgery Right 2014    ankle    Home Medications:    Medication List       This list is accurate as of: 03/11/16 11:07 AM.  Always use your most recent med list.               ALPRAZolam 1 MG tablet  Commonly known as:  XANAX  Reduce dose to 3 and 1/2 pills a day     AMITIZA 24 MCG capsule  Generic drug:  lubiprostone  Reported on 03/11/2016     FLUoxetine 20 MG capsule  Commonly known as:  PROZAC  TAKE 1 CAPSULE BY MOUTH EVERY DAY     lidocaine 5 %  Commonly known as:  LIDODERM  Place 1 patch onto the skin daily. Remove & Discard patch within 12 hours or as directed by MD     multivitamin tablet  Take 1 tablet by mouth daily.  OVER THE COUNTER MEDICATION  Calcium 1200 mg plus Vitamin D3 1000 IU (Nature's Bounty) Take two (2) Softgels daily, preferably with meals     oxybutynin 10 MG 24 hr tablet  Commonly known as:  DITROPAN XL  Take 1 tablet (10 mg total) by mouth daily.     temazepam 15 MG capsule  Commonly known as:  RESTORIL  Take 1-2 capsules (15-30 mg total) by mouth at bedtime as needed for sleep.     tiZANidine 2 MG tablet  Commonly known as:  ZANAFLEX  Reported on 03/11/2016     Vitamin D (Ergocalciferol) 50000 units Caps capsule  Commonly known as:  DRISDOL  Take 1 capsule (50,000 Units total) by mouth 2 (two) times a week. X 6 weeks.     Vitamin D3 2000 units capsule  Take 1 capsule (2,000 Units total) by mouth daily.        Allergies:  Allergies  Allergen Reactions  . Tape Rash  . Morphine And Related      Morphine Contractions    Family History: Family History  Problem Relation Age of Onset  . Cancer Mother     colon  . Diabetes Mother   . Coronary artery disease Mother   . Kidney disease Neg Hx   . Parkinson's disease Father     Social History:  reports that she quit smoking about 27 years ago. She has never used smokeless tobacco. She reports that she drinks alcohol. She reports that she does not use illicit drugs.  ROS: UROLOGY Frequent Urination?: No Hard to postpone urination?: No Burning/pain with urination?: No Get up at night to urinate?: No Leakage of urine?: No Urine stream starts and stops?: No Trouble starting stream?: Yes Do you have to strain to urinate?: Yes Blood in urine?: No Urinary tract infection?: No Sexually transmitted disease?: No Injury to kidneys or bladder?: No Painful intercourse?: No Weak stream?: Yes Currently pregnant?: No Vaginal bleeding?: No Last menstrual period?: n  Gastrointestinal Nausea?: Yes Vomiting?: No Indigestion/heartburn?: No Diarrhea?: No Constipation?: No  Constitutional Fever: No Night sweats?: No Weight loss?: No Fatigue?: No  Skin Skin rash/lesions?: No Itching?: No  Eyes Blurred vision?: No Double vision?: No  Ears/Nose/Throat Sore throat?: No Sinus problems?: No  Hematologic/Lymphatic Swollen glands?: No Easy bruising?: No  Cardiovascular Leg swelling?: No Chest pain?: No  Respiratory Cough?: No Shortness of breath?: No  Endocrine Excessive thirst?: No  Musculoskeletal Back pain?: Yes Joint pain?: No  Neurological Headaches?: Yes Dizziness?: No  Psychologic Depression?: Yes Anxiety?: Yes  Physical Exam: Ht 5\' 4"  (1.626 m)  Wt 164 lb 12.8 oz (74.753 kg)  BMI 28.27 kg/m2    Laboratory Data: Lab Results  Component Value Date   WBC 9.7 01/18/2012   HGB 10.4* 01/18/2012   HCT 31.6* 01/18/2012   MCV 92 01/18/2012   PLT 203 01/18/2012    Lab Results  Component  Value Date   CREATININE 0.72 12/07/2015    No results found for: PSA  No results found for: TESTOSTERONE  No results found for: HGBA1C  Urinalysis    Component Value Date/Time   COLORURINE AMBER* 05/03/2015 2205   COLORURINE Yellow 01/16/2012 1436   APPEARANCEUR Cloudy* 12/14/2015 1357   APPEARANCEUR CLEAR* 05/03/2015 2205   APPEARANCEUR Hazy 01/16/2012 1436   LABSPEC 1.016 05/03/2015 2205   LABSPEC 1.014 01/16/2012 1436   PHURINE 7.0 05/03/2015 2205   PHURINE 5.0 01/16/2012 1436   GLUCOSEU Negative 12/14/2015 1357   GLUCOSEU Negative 01/16/2012  [place] 05/03/2015 2205   HGBUR Negative 01/16/2012 1436   BILIRUBINUR Negative 12/14/2015 [place] 05/03/2015 2205   BILIRUBINUR Negative 01/16/2012 1436   KETONESUR NEGATIVE 05/03/2015 2205   KETONESUR Negative 01/16/2012 1436   PROTEINUR Negative 12/14/2015 1357   PROTEINUR NEGATIVE 05/03/2015 2205   PROTEINUR Negative 01/16/2012 1436   NITRITE Negative 12/14/2015 1357   NITRITE NEGATIVE 05/03/2015 2205   NITRITE Negative 01/16/2012 1436   LEUKOCYTESUR 1+* 12/14/2015 1357   LEUKOCYTESUR TRACE* 05/03/2015 2205   LEUKOCYTESUR Negative 01/16/2012 1436    Pertinent Imaging: none  Assessment & Plan:  The patient is much better on oxybutynin in regards to urgency and frequency and she still says it helps her suprapubic pain. The downside is she often masses strain to urinate. She's going on a trip. She has memory issues. I did not think there is sterilely a better option so I will see her on the oxybutynin again in 4 months  There are no diagnoses linked to this encounter.  No Follow-up on file.  Reece Packer, MD  [facility] [address] [place], [place] [address] [phone]

## 2016-03-16 ENCOUNTER — Ambulatory Visit: Payer: Self-pay | Admitting: Pain Medicine

## 2016-04-06 ENCOUNTER — Telehealth: Payer: Self-pay

## 2016-04-06 NOTE — Telephone Encounter (Signed)
Patient called asking about a psychiatry referral.  However, I do not have a pscychiatry referral for her.  Patient said that you were going to refer her to a "woman" but she didn't remember the name.  I explained I wasn't sure because I didn't receive anything on her.   She recently was referred to psychiatry by Pain Management and saw a counselor. She said she did not like the counselor.   You last previous notes for patient for her last office visit only states "patient will make own appointment for psychiatry"  Any knowledge about this or who the patient is talking about?  Dr Feliz Beam

## 2016-04-06 NOTE — Telephone Encounter (Signed)
I called patient back and I explained that Dr. Jeananne Rama said you would call to schedule an appointment, no referral was needed. Patient said that she knew, she just forgot the providers name he gave her.   Told patient Dr. Cephus Shelling, and gave patient her number.  [phone].

## 2016-04-06 NOTE — Telephone Encounter (Signed)
Patient called asking about a psychiatry referral.  However, I do not have a pscychiatry referral for her.  Patient said that you were going to refer her to a "woman" but she didn't remember the name.  I explained I wasn't sure because I didn't receive anything on her.   She recently was referred to psychiatry by Pain Management and saw a counselor. She said she did not like the counselor.   You last previous notes for patient for her last office visit only states "patient will make own appointment for psychiatry"  Any knowledge about this or who the patient is talking about?

## 2016-04-12 ENCOUNTER — Other Ambulatory Visit: Payer: Self-pay | Admitting: Pain Medicine

## 2016-05-10 ENCOUNTER — Telehealth: Payer: Self-pay

## 2016-05-10 NOTE — Telephone Encounter (Signed)
Patient needs a referral to [facility], last referral was sent somewhere that did not take her insurance

## 2016-05-11 ENCOUNTER — Other Ambulatory Visit: Payer: Self-pay

## 2016-05-11 DIAGNOSIS — F32A Depression, unspecified: Secondary | ICD-10-CM

## 2016-05-11 DIAGNOSIS — F411 Generalized anxiety disorder: Secondary | ICD-10-CM

## 2016-05-11 DIAGNOSIS — F329 Major depressive disorder, single episode, unspecified: Secondary | ICD-10-CM

## 2016-05-11 NOTE — Telephone Encounter (Signed)
See referral request.

## 2016-05-23 ENCOUNTER — Ambulatory Visit (INDEPENDENT_AMBULATORY_CARE_PROVIDER_SITE_OTHER): Payer: Medicare Other | Admitting: Psychiatry

## 2016-05-23 ENCOUNTER — Encounter: Payer: Self-pay | Admitting: Psychiatry

## 2016-05-23 VITALS — BP 143/79 | HR 85 | Temp 98.4°F | Ht 64.0 in | Wt 156.6 lb

## 2016-05-23 DIAGNOSIS — F1394 Sedative, hypnotic or anxiolytic use, unspecified with sedative, hypnotic or anxiolytic-induced mood disorder: Secondary | ICD-10-CM

## 2016-05-23 DIAGNOSIS — F1320 Sedative, hypnotic or anxiolytic dependence, uncomplicated: Secondary | ICD-10-CM

## 2016-05-23 NOTE — Progress Notes (Signed)
Psychiatric Initial Adult Assessment   Patient Identification: Catherine Hughes MRN:  [id number] Date of Evaluation:  05/23/2016 Referral Source: PCP - [place]  Chief Complaint:   Chief Complaint    Establish Care; Anxiety; Depression; Stress; Fatigue     Visit Diagnosis:    ICD-9-CM ICD-10-CM   1. 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 She reported that she has recently tapered herself out of the opioid as she had a long history of opioid dependence for the past 15 years. She reported that she continues to feel depressed and anxious and has poor memory and feels confused all the time. She stays in her bed and is unable to do anything. She is unable to remember and her husband will cook for her after he will come back home from work. She reported that she feels depressed and she has found out from her friends that she needs to be urged from the Xanax to the Klonopin as it might be a better medication. She is currently taking Xanax 3.5 mg on a daily basis as well as temazepam 30 mg as prescribed by her primary care physician Dr. Jeananne Rama Patient reported that she is on these medications for the past 10 or more years She is also taking Prozac 20 mg daily. She currently denied having any withdrawal symptoms and denied having any suicidal homicidal ideations or plans. She has picked up 90 day supply of these medications in May and will run out of the prescriptions around August 23. She reported that she came here to "get my blessings" to continue the prescriptions by Dr. Jeananne Rama.   Associated Signs/Symptoms: Depression Symptoms:  depressed mood, psychomotor retardation, fatigue, feelings of worthlessness/guilt, difficulty concentrating, impaired memory, anxiety, loss of  energy/fatigue, disturbed sleep, decreased appetite, (Hypo) Manic Symptoms:  none Anxiety Symptoms:  anxiety Psychotic Symptoms:  denied PTSD Symptoms: Negative NA  Past Psychiatric History:  Patient reported that she has seen a psychologist recently and does not recall her name. She did not like her and did not want to make the follow-up appointment with her. Denied any previous history of suicide attempts or psychiatric hospitalization  Previous Psychotropic Medications: Currently taking Prozac 20 mg daily for the depression   Substance Abuse History in the last 12 months:  Yes.    Long-term use of opioid analgesic and prescription opioid use for almost 15 years.  Chronic prescription benzodiazepine use Denied alcohol use.  No smoking   Consequences of Substance Abuse: Medical Consequences:  impaired memory  Past Medical History:  Past Medical History:  Diagnosis Date  . Anxiety   . Arthritis   . Bipolar 1 disorder ([place])   . Depression   . Fibromyalgia   . Glaucoma   . Menopausal state   . Obesity (BMI 30.0-34.9)   . Pneumonia     Past Surgical History:  Procedure Laterality Date  . APPENDECTOMY    . BREAST SURGERY Bilateral implants and removal  . FRACTURE SURGERY Right 2014   ankle  . SPINE SURGERY  L4-5   fusion and coil surgery    Family Psychiatric History:  GF and aunt- alcohol use GF- alcohol use   Family History:  Family History  Problem Relation Age of Onset  . Cancer Mother  colon  . Diabetes Mother   . Coronary artery disease Mother   . Parkinson's disease Father   . Kidney disease Neg Hx     Social History:   Social History   Social History  . Marital status: Married    Spouse name: N/A  . Number of children: N/A  . Years of education: N/A   Social History Main Topics  . Smoking status: Former Smoker    Quit date: 03/18/1989  . Smokeless tobacco: Never Used  . Alcohol use 0.0 oz/week     Comment: rarely  . Drug use: No   . Sexual activity: Not Currently   Other Topics Concern  . None   Social History Narrative  . None    Additional Social History:  Married  Since 1990.  Has  Raised 5 children, 3 from husband.  She used to work in [place]. The move to [place] and finally came to [place]  to be close to her family  Allergies:   Allergies  Allergen Reactions  . Tape Rash  . Morphine And Related     Morphine Contractions    Metabolic Disorder Labs: No results found for: HGBA1C, MPG No results found for: PROLACTIN No results found for: CHOL, TRIG, HDL, CHOLHDL, VLDL, LDLCALC   Current Medications: Current Outpatient Prescriptions  Medication Sig Dispense Refill  . ALPRAZolam (XANAX) 1 MG tablet Reduce dose to 3 and 1/2 pills a day 315 tablet 0  . AMITIZA 24 MCG capsule Reported on 03/11/2016  6  . Cholecalciferol (VITAMIN D3) 2000 units capsule Take 1 capsule (2,000 Units total) by mouth daily. 30 capsule PRN  . FLUoxetine (PROZAC) 20 MG capsule TAKE 1 CAPSULE BY MOUTH EVERY DAY 90 capsule 1  . lidocaine (LIDODERM) 5 % Place 1 patch onto the skin daily. Remove & Discard patch within 12 hours or as directed by MD    . Multiple Vitamin (MULTIVITAMIN) tablet Take 1 tablet by mouth daily.    Marland Kitchen OVER THE COUNTER MEDICATION Calcium 1200 mg plus Vitamin D3 1000 IU (Nature's Bounty) Take two (2) Softgels daily, preferably with meals 1 Bottle PRN  . temazepam (RESTORIL) 15 MG capsule Take 1-2 capsules (15-30 mg total) by mouth at bedtime as needed for sleep. 180 capsule 0  . tiZANidine (ZANAFLEX) 2 MG tablet Reported on 03/11/2016  0  . Vitamin D, Ergocalciferol, (DRISDOL) 50000 units CAPS capsule Take 1 capsule (50,000 Units total) by mouth 2 (two) times a week. X 6 weeks. 12 capsule 0  . oxybutynin (DITROPAN XL) 10 MG 24 hr tablet Take 1 tablet (10 mg total) by mouth daily. (Patient not taking: Reported on 05/23/2016) 90 tablet 3  . oxyCODONE (OXY IR/ROXICODONE) 5 MG immediate release tablet   0   No  current facility-administered medications for this visit.     Neurologic: Headache: No Seizure: No Paresthesias:No  Musculoskeletal: Strength & Muscle Tone: within normal limits Gait & Station: normal Patient leans: N/A  Psychiatric Specialty Exam: ROS  Blood pressure (!) 143/79, pulse 85, temperature 98.4 F (36.9 C), temperature source Oral, height 5\' 4"  (1.626 m), weight 156 lb 9.6 oz (71 kg).Body mass index is 26.88 kg/m.  General Appearance: Casual  Eye Contact:  Fair  Speech:  Clear and Coherent  Volume:  Normal  Mood:  Anxious  Affect:  Appropriate  Thought Process:  Coherent  Orientation:  Full (Time, Place, and Person)  Thought Content:  WDL  Suicidal Thoughts:  No  Homicidal Thoughts:  No  Memory:  Immediate;   Fair Recent;   Fair Remote;   Fair  Judgement:  Fair  Insight:  Fair  Psychomotor Activity:  Normal  Concentration:  Concentration: Fair and Attention Span: Fair  Recall:  [employer] of Knowledge:Fair  Language: Fair  Akathisia:  No  Handed:  Right  AIMS (if indicated):     Assets:  Armed forces logistics/support/administrative officer Physical Health Social Support  ADL's:  Intact  Cognition: WNL  Sleep:  good    Treatment Plan Summary: Medication management   Patient has chronic history of benzodiazepine use and she wants her medications to be switched to  Klonopin from high doses of Xanax and temazepam I advised patient that she should continue following up with Dr. Janae Bridgeman and gradually taper from Xanax and temazepam She will probably benefit from inpatient hospitalization for benzodiazepine taper to prevent going into withdrawal symptoms including seizures. No new medications will be dispensed to the patient and she will continue her outpatient appointments with her PCP at this time    More than 50% of the time spent in psychoeducation, counseling and coordination of care.    This note was generated in part or whole with voice recognition software. Voice  regonition is usually quite accurate but there are transcription errors that can and very often do occur. I apologize for any typographical errors that were not detected and corrected.   Rainey Pines, MD 8/7/20173:34 PM

## 2016-05-27 ENCOUNTER — Other Ambulatory Visit: Payer: Self-pay | Admitting: Family Medicine

## 2016-06-08 ENCOUNTER — Encounter: Payer: Self-pay | Admitting: Family Medicine

## 2016-06-08 ENCOUNTER — Ambulatory Visit (INDEPENDENT_AMBULATORY_CARE_PROVIDER_SITE_OTHER): Payer: Medicare Other | Admitting: Family Medicine

## 2016-06-08 VITALS — BP 108/73 | HR 71 | Temp 98.6°F | Wt 155.0 lb

## 2016-06-08 DIAGNOSIS — Z23 Encounter for immunization: Secondary | ICD-10-CM | POA: Diagnosis not present

## 2016-06-08 DIAGNOSIS — Z1231 Encounter for screening mammogram for malignant neoplasm of breast: Secondary | ICD-10-CM

## 2016-06-08 DIAGNOSIS — F329 Major depressive disorder, single episode, unspecified: Secondary | ICD-10-CM

## 2016-06-08 DIAGNOSIS — F32A Depression, unspecified: Secondary | ICD-10-CM

## 2016-06-08 DIAGNOSIS — Z13820 Encounter for screening for osteoporosis: Secondary | ICD-10-CM | POA: Diagnosis not present

## 2016-06-08 MED ORDER — FLUOXETINE HCL 20 MG PO CAPS: 20.0000 mg | ORAL_CAPSULE | Freq: Every day | ORAL | 6 refills | Status: DC

## 2016-06-08 MED ORDER — TEMAZEPAM 15 MG PO CAPS: 15.0000 mg | ORAL_CAPSULE | Freq: Every evening | ORAL | 0 refills | Status: DC | PRN

## 2016-06-08 MED ORDER — AMITRIPTYLINE HCL 25 MG PO TABS: 25.0000 mg | ORAL_TABLET | Freq: Every day | ORAL | 6 refills | Status: DC

## 2016-06-08 MED ORDER — ALPRAZOLAM 1 MG PO TABS: ORAL_TABLET | ORAL | 0 refills | Status: DC

## 2016-06-08 MED ORDER — LIDOCAINE 5 % EX PTCH: 1.0000 | MEDICATED_PATCH | CUTANEOUS | 1 refills | Status: DC

## 2016-06-08 NOTE — Patient Instructions (Signed)
Pneumococcal Conjugate Vaccine (PCV13)   1. Why get vaccinated?  Vaccination can protect both children and adults from pneumococcal disease.  Pneumococcal disease is caused by bacteria that can spread from person to person through close contact. It can cause ear infections, and it can also lead to more serious infections of the:  · Lungs (pneumonia),  · Blood (bacteremia), and  · Covering of the brain and spinal cord (meningitis).  Pneumococcal pneumonia is most common among adults. Pneumococcal meningitis can cause deafness and brain damage, and it kills about 1 child in 10 who get it.  Anyone can get pneumococcal disease, but children under 2 years of age and adults 65 years and older, people with certain medical conditions, and cigarette smokers are at the highest risk.  Before there was a vaccine, the [place] saw:  · more than 700 cases of meningitis,  · about 13,000 blood infections,  · about 5 million ear infections, and  · about 200 deaths  in children under 5 each year from pneumococcal disease. Since vaccine became available, severe pneumococcal disease in these children has fallen by 88%.  About 18,000 older adults die of pneumococcal disease each year in the [place].  Treatment of pneumococcal infections with penicillin and other drugs is not as effective as it used to be, because some strains of the disease have become resistant to these drugs. This makes prevention of the disease, through vaccination, even more important.  2. PCV13 vaccine  Pneumococcal conjugate vaccine (called PCV13) protects against 13 types of pneumococcal bacteria.  PCV13 is routinely given to children at 2, 4, 6, and 12-15 months of age. It is also recommended for children and adults 2 to 64 years of age with certain health conditions, and for all adults 65 years of age and older. Your doctor can give you details.  3. Some people should not get this vaccine  Anyone who has ever had a life-threatening allergic reaction  to a dose of this vaccine, to an earlier pneumococcal vaccine called PCV7, or to any vaccine containing diphtheria toxoid (for example, DTaP), should not get PCV13.  Anyone with a severe allergy to any component of PCV13 should not get the vaccine. Tell your doctor if the person being vaccinated has any severe allergies.  If the person scheduled for vaccination is not feeling well, your healthcare provider might decide to reschedule the shot on another day.  4. Risks of a vaccine reaction  With any medicine, including vaccines, there is a chance of reactions. These are usually mild and go away on their own, but serious reactions are also possible.  Problems reported following PCV13 varied by age and dose in the series. The most common problems reported among children were:  · About half became drowsy after the shot, had a temporary loss of appetite, or had redness or tenderness where the shot was given.  · About 1 out of 3 had swelling where the shot was given.  · About 1 out of 3 had a mild fever, and about 1 in 20 had a fever over 102.2°F.  · Up to about 8 out of 10 became fussy or irritable.  Adults have reported pain, redness, and swelling where the shot was given; also mild fever, fatigue, headache, chills, or muscle pain.  Young children who get PCV13 along with inactivated flu vaccine at the same time may be at increased risk for seizures caused by fever. Ask your doctor for more information.  Problems that   could happen after any vaccine:  · People sometimes faint after a medical procedure, including vaccination. Sitting or lying down for about 15 minutes can help prevent fainting, and injuries caused by a fall. Tell your doctor if you feel dizzy, or have vision changes or ringing in the ears.  · Some older children and adults get severe pain in the shoulder and have difficulty moving the arm where a shot was given. This happens very rarely.  · Any medication can cause a severe allergic reaction. Such  reactions from a vaccine are very rare, estimated at about 1 in a million doses, and would happen within a few minutes to a few hours after the vaccination.  As with any medicine, there is a very small chance of a vaccine causing a serious injury or death.  The safety of vaccines is always being monitored. For more information, visit: www.cdc.gov/vaccinesafety/  5. What if there is a serious reaction?  What should I look for?  · Look for anything that concerns you, such as signs of a severe allergic reaction, very high fever, or unusual behavior.  Signs of a severe allergic reaction can include hives, swelling of the face and throat, difficulty breathing, a fast heartbeat, dizziness, and weakness-usually within a few minutes to a few hours after the vaccination.  What should I do?  · If you think it is a severe allergic reaction or other emergency that can't wait, call 9-1-1 or get the person to the nearest hospital. Otherwise, call your doctor.  Reactions should be reported to the Vaccine Adverse Event Reporting System (VAERS). Your doctor should file this report, or you can do it yourself through the VAERS web site at www.vaers.hhs.gov, or by calling 1-800-822-7967.  VAERS does not give medical advice.  6. The National Vaccine Injury Compensation Program  The National Vaccine Injury Compensation Program (VICP) is a federal program that was created to compensate people who may have been injured by certain vaccines.  Persons who believe they may have been injured by a vaccine can learn about the program and about filing a claim by calling 1-800-338-2382 or visiting the VICP website at www.hrsa.gov/vaccinecompensation. There is a time limit to file a claim for compensation.  7. How can I learn more?  · Ask your healthcare provider. He or she can give you the vaccine package insert or suggest other sources of information.  · Call your local or state health department.  · Contact the Centers for Disease Control and  Prevention (CDC):    Call 1-800-232-4636 (1-800-CDC-INFO) or    Visit CDC's website at www.cdc.gov/vaccines  Vaccine Information Statement  PCV13 Vaccine (08/21/2014)     This information is not intended to replace advice given to you by your health care provider. Make sure you discuss any questions you have with your health care provider.     Document Released: 07/31/2006 Document Revised: 10/24/2014 Document Reviewed: 08/28/2014  Elsevier Interactive Patient Education ©2016 [employer]

## 2016-06-08 NOTE — Progress Notes (Signed)
BP 108/73 (BP Location: Left Arm, Patient Position: Sitting, Cuff Size: Small)   Pulse 71   Temp 98.6 F (37 C)   Wt 155 lb (70.3 kg)   SpO2 98%   BMI 26.61 kg/m    Subjective:    Patient ID: Catherine Hughes, female    DOB: [birth year], 65 y.o.   MRN: [id number]  HPI: ALLYSIA Hughes is a 65 y.o. female  Chief Complaint  Patient presents with  . medication check  Patient completely off all narcotics and pain medication only using Tylenol occasionally and is doing much better. Patient had extremely stormy time getting off of pain medicines but is now doing well.  Patient with ongoing chronic anxiety and poor sleep Patient with strong family history of anxiety has been on stable doses of Xanax and Restoril without problems   Relevant past medical, surgical, family and social history reviewed and updated as indicated. Interim medical history since our last visit reviewed. Allergies and medications reviewed and updated.  Review of Systems  Constitutional: Negative.   Respiratory: Negative.   Cardiovascular: Negative.     Per HPI unless specifically indicated above     Objective:    BP 108/73 (BP Location: Left Arm, Patient Position: Sitting, Cuff Size: Small)   Pulse 71   Temp 98.6 F (37 C)   Wt 155 lb (70.3 kg)   SpO2 98%   BMI 26.61 kg/m   Wt Readings from Last 3 Encounters:  06/08/16 155 lb (70.3 kg)  05/23/16 156 lb 9.6 oz (71 kg)  03/11/16 164 lb 12.8 oz (74.8 kg)    Physical Exam  Constitutional: She is oriented to person, place, and time. She appears well-developed and well-nourished. No distress.  HENT:  Head: Normocephalic and atraumatic.  Right Ear: Hearing normal.  Left Ear: Hearing normal.  Nose: Nose normal.  Eyes: Conjunctivae and lids are normal. Right eye exhibits no discharge. Left eye exhibits no discharge. No scleral icterus.  Cardiovascular: Normal rate, regular rhythm and normal heart sounds.   Pulmonary/Chest: Effort normal and breath  sounds normal. No respiratory distress.  Musculoskeletal: Normal range of motion.  Neurological: She is alert and oriented to person, place, and time.  Skin: Skin is intact. No rash noted.  Psychiatric: She has a normal mood and affect. Her speech is normal and behavior is normal. Judgment and thought content normal. Cognition and memory are normal.    Results for orders placed or performed in visit on 05/06/16  HM COLONOSCOPY  Result Value Ref Range   HM Colonoscopy Patient Reported Normal See Report, Patient Reported Normal      Assessment & Plan:   Problem List Items Addressed This Visit      Other   Depression    Depression anxiety insomnia will continue Xanax Restoril discuss smarter sleep habits will continue current medications recheck 3 months      Relevant Medications   FLUoxetine (PROZAC) 20 MG capsule   ALPRAZolam (XANAX) 1 MG tablet   amitriptyline (ELAVIL) 25 MG tablet    Other Visit Diagnoses    Encounter for screening mammogram for breast cancer    -  Primary   Relevant Orders   MM Digital Screening   Encounter for screening for osteoporosis       Relevant Orders   DG Bone Density   Need for pneumococcal vaccination       Relevant Orders   Pneumococcal conjugate vaccine 13-valent IM (Completed)  Follow up plan: Return in about 3 months (around 09/08/2016) for Med check.

## 2016-06-08 NOTE — Assessment & Plan Note (Signed)
Depression anxiety insomnia will continue Xanax Restoril discuss smarter sleep habits will continue current medications recheck 3 months

## 2016-06-14 ENCOUNTER — Ambulatory Visit: Payer: Self-pay

## 2016-07-21 ENCOUNTER — Telehealth: Payer: Self-pay | Admitting: Family Medicine

## 2016-07-21 NOTE — Telephone Encounter (Signed)
Discussed with patient she got her Prevnar 77 at august visit, wanted to wait until October for Flu shot.  [place] charge seems to have cleared up as  Patient now has $15 credit

## 2016-08-31 ENCOUNTER — Other Ambulatory Visit: Payer: Self-pay | Admitting: Family Medicine

## 2016-08-31 MED ORDER — TEMAZEPAM 15 MG PO CAPS: 15.0000 mg | ORAL_CAPSULE | Freq: Every evening | ORAL | 0 refills | Status: DC | PRN

## 2016-08-31 MED ORDER — ALPRAZOLAM 1 MG PO TABS: ORAL_TABLET | ORAL | 0 refills | Status: DC

## 2016-08-31 NOTE — Telephone Encounter (Signed)
Request for Xanax 1mg  tablet and Restoril 15mg .   Both last written 06/08/2016 w/ 90 day supply.

## 2016-08-31 NOTE — Telephone Encounter (Signed)
Pt called stated she needs refills on the following:  Xanax Restoril   Pharm is [facility] in [place] [phone]   Thanks.

## 2016-09-06 ENCOUNTER — Telehealth: Payer: Self-pay | Admitting: Family Medicine

## 2016-09-06 MED ORDER — NYSTATIN 100000 UNIT/ML MT SUSP: 5.0000 mL | Freq: Four times a day (QID) | OROMUCOSAL | 0 refills | Status: DC

## 2016-09-06 NOTE — Telephone Encounter (Signed)
Routing to provider  

## 2016-09-06 NOTE — Telephone Encounter (Signed)
Pt called stated she is still in [place] and has thrush really bad. Would like to know if Dr. Jeananne Rama can call in some [facility] mouth wash to [facility] in [place]. Phone # [phone]. Thanks.

## 2016-10-06 ENCOUNTER — Ambulatory Visit (INDEPENDENT_AMBULATORY_CARE_PROVIDER_SITE_OTHER): Payer: Medicare Other | Admitting: Family Medicine

## 2016-10-06 ENCOUNTER — Encounter: Payer: Self-pay | Admitting: Family Medicine

## 2016-10-06 DIAGNOSIS — F3341 Major depressive disorder, recurrent, in partial remission: Secondary | ICD-10-CM | POA: Diagnosis not present

## 2016-10-06 MED ORDER — AMITRIPTYLINE HCL 25 MG PO TABS: 25.0000 mg | ORAL_TABLET | Freq: Every day | ORAL | 6 refills | Status: DC

## 2016-10-06 MED ORDER — NYSTATIN 100000 UNIT/ML MT SUSP: 5.0000 mL | Freq: Four times a day (QID) | OROMUCOSAL | 0 refills | Status: DC

## 2016-10-06 MED ORDER — FLUOXETINE HCL 20 MG PO CAPS: 20.0000 mg | ORAL_CAPSULE | Freq: Every day | ORAL | 6 refills | Status: DC

## 2016-10-06 NOTE — Progress Notes (Signed)
   BP 112/75 (BP Location: Left Arm, Patient Position: Sitting, Cuff Size: Normal)   Pulse 74   Temp 98.4 F (36.9 C)   Wt 178 lb (80.7 kg)   SpO2 100%   BMI 30.55 kg/m    Subjective:    Patient ID: Catherine Hughes, female    DOB: [birth year], 65 y.o.   MRN: [id number]  HPI: Catherine Hughes is a 65 y.o. female  Follow-up nerves anxiety.  Patient all in all doing well said very stressful time with passing of her dad and grand daughter's breech birth a cesarean section Patient now home has been taking 3 Xanax a day which is a reduced dose for her and doing well. Sleeping okay Chronic pain still is a problem but doing okay all in all.  Relevant past medical, surgical, family and social history reviewed and updated as indicated. Interim medical history since our last visit reviewed. Allergies and medications reviewed and updated.  Review of Systems  Constitutional: Negative.   Respiratory: Negative.   Cardiovascular: Negative.     Per HPI unless specifically indicated above     Objective:    BP 112/75 (BP Location: Left Arm, Patient Position: Sitting, Cuff Size: Normal)   Pulse 74   Temp 98.4 F (36.9 C)   Wt 178 lb (80.7 kg)   SpO2 100%   BMI 30.55 kg/m   Wt Readings from Last 3 Encounters:  10/06/16 178 lb (80.7 kg)  06/08/16 155 lb (70.3 kg)  03/11/16 164 lb 12.8 oz (74.8 kg)    Physical Exam  Constitutional: She is oriented to person, place, and time. She appears well-developed and well-nourished. No distress.  HENT:  Head: Normocephalic and atraumatic.  Right Ear: Hearing normal.  Left Ear: Hearing normal.  Nose: Nose normal.  Eyes: Conjunctivae and lids are normal. Right eye exhibits no discharge. Left eye exhibits no discharge. No scleral icterus.  Cardiovascular: Normal rate and regular rhythm.   Pulmonary/Chest: Effort normal and breath sounds normal. No respiratory distress.  Musculoskeletal: Normal range of motion.  Neurological: She is alert and  oriented to person, place, and time.  Skin: Skin is intact. No rash noted.  Psychiatric: She has a normal mood and affect. Her speech is normal and behavior is normal. Judgment and thought content normal. Cognition and memory are normal.    Results for orders placed or performed in visit on 05/06/16  HM COLONOSCOPY  Result Value Ref Range   HM Colonoscopy Patient Reported Normal See Report, Patient Reported Normal      Assessment & Plan:   Problem List Items Addressed This Visit      Other   Depression    Depression anxiety will stop trying to taper Xanax for now and resume in the spring. Will continue current dose of Xanax Continue current dose of other medications and sleeping meds.      Relevant Medications   amitriptyline (ELAVIL) 25 MG tablet   FLUoxetine (PROZAC) 20 MG capsule       Follow up plan: Return in about 6 months (around 04/06/2017) for Physical Exam.

## 2016-10-06 NOTE — Assessment & Plan Note (Signed)
Depression anxiety will stop trying to taper Xanax for now and resume in the spring. Will continue current dose of Xanax Continue current dose of other medications and sleeping meds.

## 2016-10-19 ENCOUNTER — Other Ambulatory Visit: Payer: Self-pay | Admitting: Urology

## 2016-10-19 DIAGNOSIS — N3281 Overactive bladder: Secondary | ICD-10-CM

## 2016-12-07 ENCOUNTER — Other Ambulatory Visit: Payer: Self-pay | Admitting: Family Medicine

## 2016-12-07 MED ORDER — TEMAZEPAM 15 MG PO CAPS: 15.0000 mg | ORAL_CAPSULE | Freq: Every evening | ORAL | 0 refills | Status: DC | PRN

## 2016-12-07 NOTE — Telephone Encounter (Signed)
  Last routine OV: 10/06/16 Next OV: 12/13/16

## 2016-12-07 NOTE — Telephone Encounter (Signed)
Pt would like a refill for temazepam (RESTORIL) 15 MG capsule sent to [facility].

## 2016-12-08 ENCOUNTER — Other Ambulatory Visit: Payer: Self-pay | Admitting: Family Medicine

## 2016-12-08 MED ORDER — ALPRAZOLAM 1 MG PO TABS: ORAL_TABLET | ORAL | 0 refills | Status: DC

## 2016-12-08 NOTE — Telephone Encounter (Signed)
Pt would like a refill for ALPRAZolam (XANAX) 1 MG tablet sent to [facility].

## 2016-12-13 ENCOUNTER — Ambulatory Visit: Payer: Self-pay | Admitting: Family Medicine

## 2016-12-14 ENCOUNTER — Ambulatory Visit (INDEPENDENT_AMBULATORY_CARE_PROVIDER_SITE_OTHER): Payer: Medicare Other | Admitting: Family Medicine

## 2016-12-14 ENCOUNTER — Encounter: Payer: Self-pay | Admitting: Family Medicine

## 2016-12-14 VITALS — BP 110/73 | HR 74 | Temp 98.8°F | Wt 177.0 lb

## 2016-12-14 DIAGNOSIS — N3090 Cystitis, unspecified without hematuria: Secondary | ICD-10-CM

## 2016-12-14 DIAGNOSIS — B370 Candidal stomatitis: Secondary | ICD-10-CM | POA: Diagnosis not present

## 2016-12-14 DIAGNOSIS — R399 Unspecified symptoms and signs involving the genitourinary system: Secondary | ICD-10-CM | POA: Diagnosis not present

## 2016-12-14 LAB — UA/M W/RFLX CULTURE, ROUTINE
Bilirubin, UA: NEGATIVE
GLUCOSE, UA: NEGATIVE
Ketones, UA: NEGATIVE
LEUKOCYTES UA: NEGATIVE
Nitrite, UA: NEGATIVE
PROTEIN UA: NEGATIVE
Specific Gravity, UA: <1.005 — ABNORMAL LOW (ref 1.005–1.030)
Urobilinogen, Ur: 0.2 mg/dL (ref 0.2–1.0)
pH, UA: 6 (ref 5.0–7.5)

## 2016-12-14 LAB — MICROSCOPIC EXAMINATION
Epithelial Cells (non renal): NONE SEEN /hpf (ref 0–10)
WBC, UA: NONE SEEN /hpf (ref 0–?)

## 2016-12-14 MED ORDER — FIRST-DUKES MOUTHWASH MT SUSP: 5.0000 mL | Freq: Three times a day (TID) | OROMUCOSAL | 1 refills | Status: DC | PRN

## 2016-12-14 MED ORDER — SULFAMETHOXAZOLE-TRIMETHOPRIM 800-160 MG PO TABS
1.0000 | ORAL_TABLET | Freq: Two times a day (BID) | ORAL | 0 refills | Status: DC
Start: 2016-12-14 — End: 2016-12-19

## 2016-12-14 NOTE — Progress Notes (Signed)
BP 110/73   Pulse 74   Temp 98.8 F (37.1 C)   Wt 177 lb (80.3 kg)   SpO2 98%   BMI 30.38 kg/m    Subjective:    Patient ID: Catherine Hughes, female    DOB: 11/13/1950, 66 y.o.   MRN: [id number]  HPI: Catherine Hughes is a 67 y.o. female  Chief Complaint  Patient presents with  . Urinary Tract Infection    x 1 day, lower pressure/cramping, frequency, urgency, small amounts at a time.    Patient presents with 1 day history of lower abdominal pressure, urinary frequency, urgency, and inability to fully void. Denies fever, chills, new back pain, V. Taking AZO and a few natural herbs with minimal relief. Long hx of urinary incontinence and bladder atrophy, followed by Urology - currently on oxybutynin.    Also states she frequently gets thrush infections of the mouth, uses dukes mouthwash prn with good relief. Is almost out and wanting refill.   Past Medical History:  Diagnosis Date  . Anxiety   . Arthritis   . Bipolar 1 disorder ([place])   . Depression   . Fibromyalgia   . Glaucoma   . Menopausal state   . Obesity (BMI 30.0-34.9)   . Pneumonia    Social History   Social History  . Marital status: Married    Spouse name: N/A  . Number of children: N/A  . Years of education: N/A   Occupational History  . Not on file.   Social History Main Topics  . Smoking status: Former Smoker    Quit date: 03/18/1989  . Smokeless tobacco: Never Used  . Alcohol use 0.0 oz/week     Comment: rarely  . Drug use: No  . Sexual activity: Not Currently   Other Topics Concern  . Not on file   Social History Narrative  . No narrative on file    Relevant past medical, surgical, family and social history reviewed and updated as indicated. Interim medical history since our last visit reviewed. Allergies and medications reviewed and updated.  Review of Systems  Constitutional: Negative.   HENT: Negative.   Eyes: Negative.   Respiratory: Negative.   Cardiovascular: Negative.     Gastrointestinal: Negative.   Genitourinary: Positive for dysuria and urgency.  Musculoskeletal: Negative.   Neurological: Negative.   Psychiatric/Behavioral: Negative.     Per HPI unless specifically indicated above     Objective:    BP 110/73   Pulse 74   Temp 98.8 F (37.1 C)   Wt 177 lb (80.3 kg)   SpO2 98%   BMI 30.38 kg/m   Wt Readings from Last 3 Encounters:  12/14/16 177 lb (80.3 kg)  10/06/16 178 lb (80.7 kg)  06/08/16 155 lb (70.3 kg)    Physical Exam  Constitutional: She is oriented to person, place, and time. She appears well-developed and well-nourished.  HENT:  Head: Atraumatic.  Eyes: Conjunctivae are normal. Pupils are equal, round, and reactive to light.  Neck: Normal range of motion. Neck supple.  Cardiovascular: Normal rate and normal heart sounds.   Pulmonary/Chest: Effort normal and breath sounds normal. No respiratory distress.  Abdominal: Soft. Bowel sounds are normal. She exhibits no distension. There is tenderness (mild ttp over b/l low abdomen).  Musculoskeletal: Normal range of motion.  No CVA tenderness  Neurological: She is alert and oriented to person, place, and time.  Skin: Skin is warm and dry.  Psychiatric: She has a normal  mood and affect. Her behavior is normal.  Nursing note and vitals reviewed.     Assessment & Plan:   Problem List Items Addressed This Visit    None    Visit Diagnoses    Cystitis    -  Primary   U/A neg for bacterial infection, suspect inflammatory given RBCs and hx. Bactrim sent in case worsening. Continue AZO, push fluids, natural supplements   Relevant Orders   UA/M w/rflx Culture, Routine (STAT)   Thrush       Dukes mouthwash sent for prn use. Discussed probiotic for prevention.    Relevant Medications   sulfamethoxazole-trimethoprim (BACTRIM DS,SEPTRA DS) 800-160 MG tablet   Diphenhyd-Hydrocort-Nystatin (FIRST-DUKES MOUTHWASH) SUSP       Follow up plan: Return if symptoms worsen or fail to  improve.

## 2016-12-14 NOTE — Patient Instructions (Signed)
Follow up as needed

## 2016-12-19 ENCOUNTER — Telehealth: Payer: Self-pay | Admitting: Family Medicine

## 2016-12-19 ENCOUNTER — Other Ambulatory Visit: Payer: Self-pay | Admitting: Family Medicine

## 2016-12-19 MED ORDER — SULFAMETHOXAZOLE-TRIMETHOPRIM 800-160 MG PO TABS: 1.0000 | ORAL_TABLET | Freq: Two times a day (BID) | ORAL | 0 refills | Status: DC

## 2016-12-19 NOTE — Telephone Encounter (Signed)
Routing to provider  

## 2016-12-19 NOTE — Telephone Encounter (Signed)
Bactrim sent

## 2016-12-19 NOTE — Telephone Encounter (Signed)
Pt would like another round of sulfamethoxazole-trimethoprim (BACTRIM DS,SEPTRA DS) 800-160 MG tablet sent to [facility]. She stated she was better for the most part but not 100% and would like to know if she could have a refill.

## 2016-12-21 DIAGNOSIS — N201 Calculus of ureter: Secondary | ICD-10-CM | POA: Diagnosis not present

## 2016-12-21 DIAGNOSIS — N132 Hydronephrosis with renal and ureteral calculous obstruction: Secondary | ICD-10-CM | POA: Diagnosis not present

## 2016-12-21 DIAGNOSIS — N281 Cyst of kidney, acquired: Secondary | ICD-10-CM | POA: Diagnosis not present

## 2016-12-21 DIAGNOSIS — N1330 Unspecified hydronephrosis: Secondary | ICD-10-CM | POA: Diagnosis not present

## 2016-12-21 DIAGNOSIS — N200 Calculus of kidney: Secondary | ICD-10-CM | POA: Diagnosis not present

## 2016-12-21 DIAGNOSIS — R102 Pelvic and perineal pain: Secondary | ICD-10-CM | POA: Diagnosis not present

## 2016-12-29 DIAGNOSIS — N200 Calculus of kidney: Secondary | ICD-10-CM | POA: Diagnosis not present

## 2016-12-29 DIAGNOSIS — N398 Other specified disorders of urinary system: Secondary | ICD-10-CM | POA: Diagnosis not present

## 2016-12-29 DIAGNOSIS — M62838 Other muscle spasm: Secondary | ICD-10-CM | POA: Diagnosis not present

## 2017-01-23 ENCOUNTER — Telehealth: Payer: Self-pay

## 2017-01-23 NOTE — Telephone Encounter (Signed)
Attempted to reach to schedule for Medicare Wellness. Message left for pt to return call.   

## 2017-01-25 DIAGNOSIS — M9901 Segmental and somatic dysfunction of cervical region: Secondary | ICD-10-CM | POA: Diagnosis not present

## 2017-01-25 DIAGNOSIS — M62830 Muscle spasm of back: Secondary | ICD-10-CM | POA: Diagnosis not present

## 2017-01-25 DIAGNOSIS — M9903 Segmental and somatic dysfunction of lumbar region: Secondary | ICD-10-CM | POA: Diagnosis not present

## 2017-01-25 DIAGNOSIS — M50322 Other cervical disc degeneration at C5-C6 level: Secondary | ICD-10-CM | POA: Diagnosis not present

## 2017-01-25 DIAGNOSIS — M9902 Segmental and somatic dysfunction of thoracic region: Secondary | ICD-10-CM | POA: Diagnosis not present

## 2017-01-27 DIAGNOSIS — M62830 Muscle spasm of back: Secondary | ICD-10-CM | POA: Diagnosis not present

## 2017-01-27 DIAGNOSIS — M9903 Segmental and somatic dysfunction of lumbar region: Secondary | ICD-10-CM | POA: Diagnosis not present

## 2017-01-27 DIAGNOSIS — M9901 Segmental and somatic dysfunction of cervical region: Secondary | ICD-10-CM | POA: Diagnosis not present

## 2017-01-27 DIAGNOSIS — M50322 Other cervical disc degeneration at C5-C6 level: Secondary | ICD-10-CM | POA: Diagnosis not present

## 2017-01-27 DIAGNOSIS — M9902 Segmental and somatic dysfunction of thoracic region: Secondary | ICD-10-CM | POA: Diagnosis not present

## 2017-01-30 DIAGNOSIS — M9903 Segmental and somatic dysfunction of lumbar region: Secondary | ICD-10-CM | POA: Diagnosis not present

## 2017-01-30 DIAGNOSIS — M50322 Other cervical disc degeneration at C5-C6 level: Secondary | ICD-10-CM | POA: Diagnosis not present

## 2017-01-30 DIAGNOSIS — M9902 Segmental and somatic dysfunction of thoracic region: Secondary | ICD-10-CM | POA: Diagnosis not present

## 2017-01-30 DIAGNOSIS — M9901 Segmental and somatic dysfunction of cervical region: Secondary | ICD-10-CM | POA: Diagnosis not present

## 2017-01-30 DIAGNOSIS — M62830 Muscle spasm of back: Secondary | ICD-10-CM | POA: Diagnosis not present

## 2017-02-13 ENCOUNTER — Ambulatory Visit (INDEPENDENT_AMBULATORY_CARE_PROVIDER_SITE_OTHER): Payer: Medicare Other | Admitting: Family Medicine

## 2017-02-13 ENCOUNTER — Encounter: Payer: Self-pay | Admitting: Family Medicine

## 2017-02-13 DIAGNOSIS — F411 Generalized anxiety disorder: Secondary | ICD-10-CM

## 2017-02-13 DIAGNOSIS — G4700 Insomnia, unspecified: Secondary | ICD-10-CM

## 2017-02-13 DIAGNOSIS — M62838 Other muscle spasm: Secondary | ICD-10-CM | POA: Diagnosis not present

## 2017-02-13 MED ORDER — CYCLOBENZAPRINE HCL 5 MG PO TABS: 5.0000 mg | ORAL_TABLET | Freq: Three times a day (TID) | ORAL | 1 refills | Status: DC | PRN

## 2017-02-13 MED ORDER — FLUOXETINE HCL 10 MG PO CAPS: 10.0000 mg | ORAL_CAPSULE | Freq: Every day | ORAL | 6 refills | Status: DC

## 2017-02-13 NOTE — Assessment & Plan Note (Signed)
Discussed chronic anxiety patient stable on Xanax. At 3 a day and doing stable. Will continue current usage

## 2017-02-13 NOTE — Assessment & Plan Note (Signed)
Discuss Will continue range of motion exercises and stretching and massage gave information on local massage therapy and will try Flexeril cautions about driving.

## 2017-02-13 NOTE — Assessment & Plan Note (Signed)
Discussed chronic insomnia patient's been using a sleep 30 mg at night due to neck pain

## 2017-02-13 NOTE — Progress Notes (Signed)
BP 102/68   Pulse 73   Temp 98.8 F (37.1 C)   Wt 171 lb (77.6 kg)   SpO2 99%   BMI 29.35 kg/m    Subjective:    Patient ID: Catherine Hughes, female    DOB: 03/13/1951, 66 y.o.   MRN: [id number]  HPI: Catherine Hughes is a 66 y.o. female  Chief Complaint  Patient presents with  . Depression    she is interested in a lower dose of Prozac  . Anxiety  Patient depression doing really well has been taking Prozac long-term without problems is interested in tapering and discontinuing at some point in this summer. Patient wants to cut back to 10 mg  Patient doing well on Xanax 1 mg 3 times a day not having any issues with medications is interested in cutting back on that after Prozac may be starting this fall or so. Doing well with Restoril takes 15 mg at bedtime and sleeps okay except for as noted below. Patient over the last several months has developed a terrible crick in her neck is worked with massage therapy and chiropractic massage to help some but this massage therapy is gotten too expensive.'s interested in maybe some muscle relaxers.  Relevant past medical, surgical, family and social history reviewed and updated as indicated. Interim medical history since our last visit reviewed. Allergies and medications reviewed and updated.  Review of Systems  Constitutional: Negative.   Respiratory: Negative.   Cardiovascular: Negative.     Per HPI unless specifically indicated above     Objective:    BP 102/68   Pulse 73   Temp 98.8 F (37.1 C)   Wt 171 lb (77.6 kg)   SpO2 99%   BMI 29.35 kg/m   Wt Readings from Last 3 Encounters:  02/13/17 171 lb (77.6 kg)  12/14/16 177 lb (80.3 kg)  10/06/16 178 lb (80.7 kg)    Physical Exam  Constitutional: She is oriented to person, place, and time. She appears well-developed and well-nourished.  HENT:  Head: Normocephalic and atraumatic.  Eyes: Conjunctivae and EOM are normal.  Neck: Normal range of motion.  Cardiovascular:  Normal rate, regular rhythm and normal heart sounds.   Pulmonary/Chest: Effort normal and breath sounds normal.  Musculoskeletal: Normal range of motion.  Neurological: She is alert and oriented to person, place, and time.  Skin: No erythema.  Psychiatric: She has a normal mood and affect. Her behavior is normal. Judgment and thought content normal.    Results for orders placed or performed in visit on 12/14/16  Microscopic Examination  Result Value Ref Range   WBC, UA None seen 0 - 5 /hpf   RBC, UA 3-10 (A) 0 - 2 /hpf   Epithelial Cells (non renal) None seen 0 - 10 /hpf   Bacteria, UA Few (A) None seen/Few  UA/M w/rflx Culture, Routine (STAT)  Result Value Ref Range   Specific Gravity, UA <1.005 (L) 1.005 - 1.030   pH, UA 6.0 5.0 - 7.5   Color, UA Yellow Yellow   Appearance Ur Clear Clear   Leukocytes, UA Negative Negative   Protein, UA Negative Negative/Trace   Glucose, UA Negative Negative   Ketones, UA Negative Negative   RBC, UA 3+ (A) Negative   Bilirubin, UA Negative Negative   Urobilinogen, Ur 0.2 0.2 - 1.0 mg/dL   Nitrite, UA Negative Negative   Microscopic Examination See below:       Assessment & Plan:   Problem  List Items Addressed This Visit      Other   Generalized anxiety disorder (Chronic)    Discussed chronic anxiety patient stable on Xanax. At 3 a day and doing stable. Will continue current usage      Insomnia    Discussed chronic insomnia patient's been using a sleep 30 mg at night due to neck pain      Cervical paraspinal muscle spasm    Discuss Will continue range of motion exercises and stretching and massage gave information on local massage therapy and will try Flexeril cautions about driving.         For patient's Xanax and Restoril prescriptions has another 30 day supply and prescription will give refill next month. Follow up plan: Return in about 6 months (around 08/15/2017) for Physical Exam.

## 2017-03-07 ENCOUNTER — Other Ambulatory Visit: Payer: Self-pay | Admitting: Family Medicine

## 2017-03-07 NOTE — Telephone Encounter (Signed)
  Last routine OV: 02/13/17 Next OV: None on file.

## 2017-04-13 DIAGNOSIS — J019 Acute sinusitis, unspecified: Secondary | ICD-10-CM | POA: Diagnosis not present

## 2017-06-01 ENCOUNTER — Other Ambulatory Visit: Payer: Self-pay | Admitting: Family Medicine

## 2017-06-01 MED ORDER — ALPRAZOLAM 1 MG PO TABS: 1.0000 mg | ORAL_TABLET | Freq: Three times a day (TID) | ORAL | 0 refills | Status: DC | PRN

## 2017-06-01 MED ORDER — TEMAZEPAM 15 MG PO CAPS: 15.0000 mg | ORAL_CAPSULE | Freq: Every evening | ORAL | 0 refills | Status: DC | PRN

## 2017-06-01 NOTE — Telephone Encounter (Signed)
Patient needing a 3 month refill on medication for Xanax and temazepam. Patient would like to know if she needs to schedule an appointment or if she can only have her medications refilled and schedule an appointment in October. Patient would like to be notified when and if medications are ready for pickup.    Patient would like to speak with provider regarding who he would recommend for a gynecologist appointment.  Please Advise.  Thank you

## 2017-06-02 NOTE — Telephone Encounter (Signed)
Attempted to reach pt. No answer. RX was faxed to [facility] at my desk.

## 2017-06-07 ENCOUNTER — Other Ambulatory Visit: Payer: Self-pay | Admitting: Family Medicine

## 2017-06-07 ENCOUNTER — Ambulatory Visit (INDEPENDENT_AMBULATORY_CARE_PROVIDER_SITE_OTHER): Payer: Medicare Other

## 2017-06-07 VITALS — BP 118/76 | HR 62 | Temp 98.6°F | Resp 16 | Ht 65.0 in | Wt 162.1 lb

## 2017-06-07 DIAGNOSIS — Z1211 Encounter for screening for malignant neoplasm of colon: Secondary | ICD-10-CM

## 2017-06-07 DIAGNOSIS — Z0000 Encounter for general adult medical examination without abnormal findings: Secondary | ICD-10-CM

## 2017-06-07 NOTE — Progress Notes (Signed)
Subjective:   Catherine Hughes is a 66 y.o. female who presents for Medicare Annual (Subsequent) preventive examination.  Review of Systems:  Cardiac Risk Factors include: advanced age (>58men, >72 women);hypertension;dyslipidemia     Objective:     Vitals: BP 118/76 (BP Location: Right Arm, Patient Position: Sitting)   Pulse 62   Temp 98.6 F (37 C)   Resp 16   Ht 5\' 5"  (1.651 m)   Wt 162 lb 1.6 oz (73.5 kg)   BMI 26.97 kg/m   Body mass index is 26.97 kg/m.   Tobacco History  Smoking Status  . Former Smoker  . Quit date: 03/18/1989  Smokeless Tobacco  . Never Used     Counseling given: Not Answered   Past Medical History:  Diagnosis Date  . Anxiety   . Arthritis   . Bipolar 1 disorder ([place])   . Depression   . Fibromyalgia   . Glaucoma   . Menopausal state   . Obesity (BMI 30.0-34.9)   . Pneumonia    Past Surgical History:  Procedure Laterality Date  . APPENDECTOMY    . BREAST SURGERY Bilateral implants and removal  . FRACTURE SURGERY Right 2014   ankle  . SPINE SURGERY  L4-5   fusion and coil surgery   Family History  Problem Relation Age of Onset  . Cancer Mother        colon  . Diabetes Mother   . Coronary artery disease Mother   . Parkinson's disease Father   . Kidney disease Neg Hx    History  Sexual Activity  . Sexual activity: Not Currently    Outpatient Encounter Prescriptions as of 06/07/2017  Medication Sig  . ALPRAZolam (XANAX) 1 MG tablet Take 1 tablet (1 mg total) by mouth 3 (three) times daily as needed for anxiety.  . Cholecalciferol (VITAMIN D3) 2000 units capsule Take 1 capsule (2,000 Units total) by mouth daily.  . cyclobenzaprine (FLEXERIL) 5 MG tablet Take 1 tablet (5 mg total) by mouth 3 (three) times daily as needed for muscle spasms.  . fexofenadine (ALLEGRA) 30 MG tablet Take 30 mg by mouth 2 (two) times daily.  . fluticasone (FLONASE) 50 MCG/ACT nasal spray Place 1 spray into both nostrils daily.  Marland Kitchen loratadine  (CLARITIN) 10 MG tablet Take 10 mg by mouth daily.  . Multiple Vitamin (MULTIVITAMIN) tablet Take 1 tablet by mouth daily.  . temazepam (RESTORIL) 15 MG capsule Take 1 capsule (15 mg total) by mouth at bedtime as needed for sleep.  . [DISCONTINUED] FLUoxetine (PROZAC) 10 MG capsule Take 1 capsule (10 mg total) by mouth daily.   No facility-administered encounter medications on file as of 06/07/2017.     Activities of Daily Living In your present state of health, do you have any difficulty performing the following activities: 06/07/2017 06/08/2016  Hearing? Tempie Donning  Vision? Y N  Difficulty concentrating or making decisions? Tempie Donning  Walking or climbing stairs? Y N  Dressing or bathing? N N  Doing errands, shopping? N N  Preparing Food and eating ? N -  Using the Toilet? N -  In the past six months, have you accidently leaked urine? Y -  Comment pads rarely  -  Do you have problems with loss of bowel control? N -  Managing your Medications? N -  Managing your Finances? N -  Housekeeping or managing your Housekeeping? N -  Some recent data might be hidden    Patient Care Team:  Guadalupe Maple, MD as PCP - General (Family Medicine)    Assessment:     Exercise Activities and Dietary recommendations Current Exercise Habits: The patient does not participate in regular exercise at present  Goals    . Increase water intake          Recommend drinking at least 3-4 glasses of water a day       Fall Risk Fall Risk  06/07/2017 03/10/2016 12/29/2015 12/02/2015  Falls in the past year? No No Yes No  Number falls in past yr: - - 2 or more -  Injury with Fall? - - No -  Risk for fall due to : - - History of fall(s) -  Follow up - - Falls prevention discussed -   Depression Screen PHQ 2/9 Scores 06/07/2017 02/13/2017 03/10/2016 12/29/2015  PHQ - 2 Score 0 0 6 6  PHQ- 9 Score - 3 22 24   Exception Documentation - - - Medical reason     Cognitive Function     6CIT Screen 06/07/2017  What Year?  0 points  What month? 0 points  What time? 0 points  Count back from 20 0 points  Months in reverse 0 points  Repeat phrase 0 points  Total Score 0    Immunization History  Administered Date(s) Administered  . Pneumococcal Conjugate-13 06/08/2016  . Td 12/01/2008  . Tdap 07/11/2016   Screening Tests Health Maintenance  Topic Date Due  . INFLUENZA VACCINE  05/17/2017  . MAMMOGRAM  09/16/2017 (Originally 09/01/1999)  . COLONOSCOPY  09/16/2017 (Originally 10/20/2016)  . PNA vac Low Risk Adult (2 of 2 - PPSV23) 06/08/2017  . TETANUS/TDAP  07/11/2026  . DEXA SCAN  Completed  . Hepatitis C Screening  Completed      Plan:     I have personally reviewed and addressed the Medicare Annual Wellness questionnaire and have noted the following in the patient's chart:  A. Medical and social history B. Use of alcohol, tobacco or illicit drugs  C. Current medications and supplements D. Functional ability and status E.  Nutritional status F.  Physical activity G. Advance directives H. List of other physicians I.  Hospitalizations, surgeries, and ER visits in previous 12 months J.  [place] such as hearing and vision if needed, cognitive and depression L. Referrals and appointments   In addition, I have reviewed and discussed with patient certain preventive protocols, quality metrics, and best practice recommendations. A written personalized care plan for preventive services as well as general preventive health recommendations were provided to patient.   Signed,  Tyler Aas, LPN Nurse Health Advisor   MD Recommendations/Notes: Patient reports having lumps on left upper arm, denies any pain, redness or tenderness.She has lost approx 60 pounds in the last 2 years, and has just recently noted the lumps. She has approx 6 2-3cm lumps noted at antecubital area with no tenderness on palpitation. Lumps appear to be moveable and soft to touch.  She was informed to call if these  lumps worsened by MD.  Patient complained of allergies, requested to go to dermatologist, Spoke with Dr.Crissman- recommended patient try Claritin, Allegra and flonase daily and call if it persists or worsens.   Patient requests refill on flexeril.

## 2017-06-07 NOTE — Patient Instructions (Addendum)
Catherine Hughes , Thank you for taking time to come for your Medicare Wellness Visit. I appreciate your ongoing commitment to your health goals. Please review the following plan we discussed and let me know if I can assist you in the future.   Screening recommendations/referrals: Colonoscopy: due now- someone will call you to schedule this Mammogram: due now-  Call to schedule this  Bone Density: completed 11/29/2010 Recommended yearly ophthalmology/optometry visit for glaucoma screening and checkup Recommended yearly dental visit for hygiene and checkup  Vaccinations: Influenza vaccine: due 06/2017 Pneumococcal vaccine: pneumovax 23 due - declined today  Tdap vaccine: up to date Shingles vaccine: due, check with your insurance company for coverage    Advanced directives: Advance directive discussed with you today. I have provided a copy for you to complete at home and have notarized. Once this is complete please bring a copy in to our office so we can scan it into your chart.  Conditions/risks identified: Recommend drinking at least 3-4 glasses of water a day   Next appointment: Follow up in one year for your annual wellness exam.    Preventive Care 68 Years and Older, Female Preventive care refers to lifestyle choices and visits with your health care provider that can promote health and wellness. What does preventive care include?  A yearly physical exam. This is also called an annual well check.  Dental exams once or twice a year.  Routine eye exams. Ask your health care provider how often you should have your eyes checked.  Personal lifestyle choices, including:  Daily care of your teeth and gums.  Regular physical activity.  Eating a healthy diet.  Avoiding tobacco and drug use.  Limiting alcohol use.  Practicing safe sex.  Taking low-dose aspirin every day.  Taking vitamin and mineral supplements as recommended by your health care provider. What happens during an  annual well check? The services and screenings done by your health care provider during your annual well check will depend on your age, overall health, lifestyle risk factors, and family history of disease. Counseling  Your health care provider may ask you questions about your:  Alcohol use.  Tobacco use.  Drug use.  Emotional well-being.  Home and relationship well-being.  Sexual activity.  Eating habits.  History of falls.  Memory and ability to understand (cognition).  Work and work Statistician.  Reproductive health. Screening  You may have the following tests or measurements:  Height, weight, and BMI.  Blood pressure.  Lipid and cholesterol levels. These may be checked every 5 years, or more frequently if you are over 66 years old.  Skin check.  Lung cancer screening. You may have this screening every year starting at age 68 if you have a 30-pack-year history of smoking and currently smoke or have quit within the past 68 years.  Fecal occult blood test (FOBT) of the stool. You may have this test every year starting at age 68.  Flexible sigmoidoscopy or colonoscopy. You may have a sigmoidoscopy every 5 years or a colonoscopy every 10 years starting at age 68.  Hepatitis C blood test.  Hepatitis B blood test.  Sexually transmitted disease (STD) testing.  Diabetes screening. This is done by checking your blood sugar (glucose) after you have not eaten for a while (fasting). You may have this done every 1-3 years.  Bone density scan. This is done to screen for osteoporosis. You may have this done starting at age 68.  Mammogram. This may be done every  1-2 years. Talk to your health care provider about how often you should have regular mammograms. Talk with your health care provider about your test results, treatment options, and if necessary, the need for more tests. Vaccines  Your health care provider may recommend certain vaccines, such as:  Influenza  vaccine. This is recommended every year.  Tetanus, diphtheria, and acellular pertussis (Tdap, Td) vaccine. You may need a Td booster every 10 years.  Zoster vaccine. You may need this after age 68.  Pneumococcal 13-valent conjugate (PCV13) vaccine. One dose is recommended after age 68.  Pneumococcal polysaccharide (PPSV23) vaccine. One dose is recommended after age 68. Talk to your health care provider about which screenings and vaccines you need and how often you need them. This information is not intended to replace advice given to you by your health care provider. Make sure you discuss any questions you have with your health care provider. Document Released: 10/30/2015 Document Revised: 06/22/2016 Document Reviewed: 08/04/2015 Elsevier Interactive Patient Education  2017 [place] Prevention in the Home Falls can cause injuries. They can happen to people of all ages. There are many things you can do to make your home safe and to help prevent falls. What can I do on the outside of my home?  Regularly fix the edges of walkways and driveways and fix any cracks.  Remove anything that might make you trip as you walk through a door, such as a raised step or threshold.  Trim any bushes or trees on the path to your home.  Use bright outdoor lighting.  Clear any walking paths of anything that might make someone trip, such as rocks or tools.  Regularly check to see if handrails are loose or broken. Make sure that both sides of any steps have handrails.  Any raised decks and porches should have guardrails on the edges.  Have any leaves, snow, or ice cleared regularly.  Use sand or salt on walking paths during winter.  Clean up any spills in your garage right away. This includes oil or grease spills. What can I do in the bathroom?  Use night lights.  Install grab bars by the toilet and in the tub and shower. Do not use towel bars as grab bars.  Use non-skid mats or decals  in the tub or shower.  If you need to sit down in the shower, use a plastic, non-slip stool.  Keep the floor dry. Clean up any water that spills on the floor as soon as it happens.  Remove soap buildup in the tub or shower regularly.  Attach bath mats securely with double-sided non-slip rug tape.  Do not have throw rugs and other things on the floor that can make you trip. What can I do in the bedroom?  Use night lights.  Make sure that you have a light by your bed that is easy to reach.  Do not use any sheets or blankets that are too big for your bed. They should not hang down onto the floor.  Have a firm chair that has side arms. You can use this for support while you get dressed.  Do not have throw rugs and other things on the floor that can make you trip. What can I do in the kitchen?  Clean up any spills right away.  Avoid walking on wet floors.  Keep items that you use a lot in easy-to-reach places.  If you need to reach something above you, use a strong  step stool that has a grab bar.  Keep electrical cords out of the way.  Do not use floor polish or wax that makes floors slippery. If you must use wax, use non-skid floor wax.  Do not have throw rugs and other things on the floor that can make you trip. What can I do with my stairs?  Do not leave any items on the stairs.  Make sure that there are handrails on both sides of the stairs and use them. Fix handrails that are broken or loose. Make sure that handrails are as long as the stairways.  Check any carpeting to make sure that it is firmly attached to the stairs. Fix any carpet that is loose or worn.  Avoid having throw rugs at the top or bottom of the stairs. If you do have throw rugs, attach them to the floor with carpet tape.  Make sure that you have a light switch at the top of the stairs and the bottom of the stairs. If you do not have them, ask someone to add them for you. What else can I do to help  prevent falls?  Wear shoes that:  Do not have high heels.  Have rubber bottoms.  Are comfortable and fit you well.  Are closed at the toe. Do not wear sandals.  If you use a stepladder:  Make sure that it is fully opened. Do not climb a closed stepladder.  Make sure that both sides of the stepladder are locked into place.  Ask someone to hold it for you, if possible.  Clearly mark and make sure that you can see:  Any grab bars or handrails.  First and last steps.  Where the edge of each step is.  Use tools that help you move around (mobility aids) if they are needed. These include:  Canes.  Walkers.  Scooters.  Crutches.  Turn on the lights when you go into a dark area. Replace any light bulbs as soon as they burn out.  Set up your furniture so you have a clear path. Avoid moving your furniture around.  If any of your floors are uneven, fix them.  If there are any pets around you, be aware of where they are.  Review your medicines with your doctor. Some medicines can make you feel dizzy. This can increase your chance of falling. Ask your doctor what other things that you can do to help prevent falls. This information is not intended to replace advice given to you by your health care provider. Make sure you discuss any questions you have with your health care provider. Document Released: 07/30/2009 Document Revised: 03/10/2016 Document Reviewed: 11/07/2014 Elsevier Interactive Patient Education  2017 [employer].

## 2017-06-09 ENCOUNTER — Telehealth: Payer: Self-pay

## 2017-06-09 ENCOUNTER — Other Ambulatory Visit: Payer: Self-pay

## 2017-06-09 DIAGNOSIS — Z1211 Encounter for screening for malignant neoplasm of colon: Secondary | ICD-10-CM

## 2017-06-09 DIAGNOSIS — Z1212 Encounter for screening for malignant neoplasm of rectum: Principal | ICD-10-CM

## 2017-06-09 NOTE — Telephone Encounter (Signed)
Gastroenterology Pre-Procedure Review  Request Date: 10/19 Requesting Physician: Dr. Vicente Males  PATIENT REVIEW QUESTIONS: The patient responded to the following health history questions as indicated:    1. Are you having any GI issues? no 2. Do you have a personal history of Polyps? yes (1 large polyp + 10 years prior) 3. Do you have a family history of Colon Cancer or Polyps? yes (mother: colon cancer) 4. Diabetes Mellitus? no 5. Joint replacements in the past 12 months?no 6. Major health problems in the past 3 months?no 7. Any artificial heart valves, MVP, or defibrillator?no    MEDICATIONS & ALLERGIES:    Patient reports the following regarding taking any anticoagulation/antiplatelet therapy:   Plavix, Coumadin, Eliquis, Xarelto, Lovenox, Pradaxa, Brilinta, or Effient? no Aspirin? no  Patient confirms/reports the following medications:  Current Outpatient Prescriptions  Medication Sig Dispense Refill  . ALPRAZolam (XANAX) 1 MG tablet Take 1 tablet (1 mg total) by mouth 3 (three) times daily as needed for anxiety. 270 tablet 0  . Cholecalciferol (VITAMIN D3) 2000 units capsule Take 1 capsule (2,000 Units total) by mouth daily. 30 capsule PRN  . cyclobenzaprine (FLEXERIL) 5 MG tablet TAKE 1 TABLET(5 MG) BY MOUTH THREE TIMES DAILY AS NEEDED FOR MUSCLE SPASMS 30 tablet 0  . fexofenadine (ALLEGRA) 30 MG tablet Take 30 mg by mouth 2 (two) times daily.    . fluticasone (FLONASE) 50 MCG/ACT nasal spray Place 1 spray into both nostrils daily.    Marland Kitchen loratadine (CLARITIN) 10 MG tablet Take 10 mg by mouth daily.    . Multiple Vitamin (MULTIVITAMIN) tablet Take 1 tablet by mouth daily.    . temazepam (RESTORIL) 15 MG capsule Take 1 capsule (15 mg total) by mouth at bedtime as needed for sleep. 180 capsule 0   No current facility-administered medications for this visit.     Patient confirms/reports the following allergies:  Allergies  Allergen Reactions  . Tape Rash  . Morphine And Related    Morphine Contractions  . Terfenadine Palpitations    No orders of the defined types were placed in this encounter.   AUTHORIZATION INFORMATION Primary Insurance: 1D#: Group #:  Secondary Insurance: 1D#: Group #:  SCHEDULE INFORMATION: Date: 10/19 Time: Location: [place]

## 2017-06-27 ENCOUNTER — Telehealth: Payer: Self-pay | Admitting: Family Medicine

## 2017-06-27 NOTE — Telephone Encounter (Signed)
Patient would like to have some blood work done to see what kind of minerals and vitamins her body is lacking.  Can this be done without seeing a provider.  Please advise  Thanks  Catherine Hughes [phone]

## 2017-06-28 ENCOUNTER — Ambulatory Visit (INDEPENDENT_AMBULATORY_CARE_PROVIDER_SITE_OTHER): Payer: Medicare Other | Admitting: Family Medicine

## 2017-06-28 ENCOUNTER — Encounter: Payer: Self-pay | Admitting: Family Medicine

## 2017-06-28 DIAGNOSIS — M797 Fibromyalgia: Secondary | ICD-10-CM

## 2017-06-28 DIAGNOSIS — E559 Vitamin D deficiency, unspecified: Secondary | ICD-10-CM | POA: Diagnosis not present

## 2017-06-28 DIAGNOSIS — R5382 Chronic fatigue, unspecified: Secondary | ICD-10-CM | POA: Diagnosis not present

## 2017-06-28 NOTE — Assessment & Plan Note (Signed)
Reviewed vitamin D deficiency patient's had some replacement therapy hasn't had any levels drawn since 2017 will recheck again today.

## 2017-06-28 NOTE — Telephone Encounter (Signed)
Please advise 

## 2017-06-28 NOTE — Assessment & Plan Note (Signed)
Will also check CMP and thyroid.

## 2017-06-28 NOTE — Assessment & Plan Note (Signed)
Due to ongoing chronic fatigue and insomnia symptoms and possible vitamin deficiencies Will check CBC and B12 levels.

## 2017-06-28 NOTE — Progress Notes (Signed)
BP 108/75   Pulse 94   Wt 164 lb (74.4 kg)   SpO2 99%   BMI 27.29 kg/m    Subjective:    Patient ID: Catherine Hughes, female    DOB: Apr 03, 1951, 66 y.o.   MRN: [id number]  HPI: Catherine Hughes is a 67 y.o. female  Some concern about nauseous at night and nauseous when waking up. Has some minor little skin nodules on left biceps area will observe for now. Concerned about nutritional deficiencies. In particular concerned about iron stores as having some fingernail changes. No obvious bleeding, does seem to bruise a lot. Patient also having symptoms of fatigue and energy levels poor sleep not going to sleep until early in the morning.  Relevant past medical, surgical, family and social history reviewed and updated as indicated. Interim medical history since our last visit reviewed. Allergies and medications reviewed and updated.  Review of Systems  Constitutional: Negative.   Respiratory: Negative.   Cardiovascular: Negative.     Per HPI unless specifically indicated above     Objective:    BP 108/75   Pulse 94   Wt 164 lb (74.4 kg)   SpO2 99%   BMI 27.29 kg/m   Wt Readings from Last 3 Encounters:  06/28/17 164 lb (74.4 kg)  06/07/17 162 lb 1.6 oz (73.5 kg)  02/13/17 171 lb (77.6 kg)    Physical Exam  Constitutional: She is oriented to person, place, and time. She appears well-developed and well-nourished.  HENT:  Head: Normocephalic and atraumatic.  Eyes: Conjunctivae and EOM are normal.  Neck: Normal range of motion.  Cardiovascular: Normal rate, regular rhythm and normal heart sounds.   Pulmonary/Chest: Effort normal and breath sounds normal.  Musculoskeletal: Normal range of motion.  Neurological: She is alert and oriented to person, place, and time.  Skin: No erythema.  Psychiatric: She has a normal mood and affect. Her behavior is normal. Judgment and thought content normal.    Results for orders placed or performed in visit on 12/14/16  Microscopic  Examination  Result Value Ref Range   WBC, UA None seen 0 - 5 /hpf   RBC, UA 3-10 (A) 0 - 2 /hpf   Epithelial Cells (non renal) None seen 0 - 10 /hpf   Bacteria, UA Few (A) None seen/Few  UA/M w/rflx Culture, Routine (STAT)  Result Value Ref Range   Specific Gravity, UA <1.005 (L) 1.005 - 1.030   pH, UA 6.0 5.0 - 7.5   Color, UA Yellow Yellow   Appearance Ur Clear Clear   Leukocytes, UA Negative Negative   Protein, UA Negative Negative/Trace   Glucose, UA Negative Negative   Ketones, UA Negative Negative   RBC, UA 3+ (A) Negative   Bilirubin, UA Negative Negative   Urobilinogen, Ur 0.2 0.2 - 1.0 mg/dL   Nitrite, UA Negative Negative   Microscopic Examination See below:       Assessment & Plan:   Problem List Items Addressed This Visit      Other   Fibromyalgia (Chronic)    Will also check CMP and thyroid.      Relevant Orders   TSH   Comprehensive metabolic panel   Vitamin D deficiency    Reviewed vitamin D deficiency patient's had some replacement therapy hasn't had any levels drawn since 2017 will recheck again today.      Relevant Orders   VITAMIN D 25 Hydroxy (Vit-D Deficiency, Fractures)   Chronic fatigue  Due to ongoing chronic fatigue and insomnia symptoms and possible vitamin deficiencies Will check CBC and B12 levels.      Relevant Orders   CBC with Differential/Platelet   Vitamin B12   TSH   Comprehensive metabolic panel       Follow up plan: Return if symptoms worsen or fail to improve, for As scheduled.

## 2017-06-28 NOTE — Telephone Encounter (Signed)
She will need an appointment for a lab work order. This is to get insurance to cover the expense.

## 2017-06-28 NOTE — Telephone Encounter (Signed)
Pt will come in this afternoon

## 2017-06-29 ENCOUNTER — Ambulatory Visit: Payer: Medicare Other | Admitting: Family Medicine

## 2017-06-29 ENCOUNTER — Telehealth: Payer: Self-pay | Admitting: Family Medicine

## 2017-06-29 LAB — COMPREHENSIVE METABOLIC PANEL
A/G RATIO: 1.4 (ref 1.2–2.2)
ALK PHOS: 93 IU/L (ref 39–117)
ALT: 15 IU/L (ref 0–32)
AST: 19 IU/L (ref 0–40)
Albumin: 4.2 g/dL (ref 3.6–4.8)
BUN/Creatinine Ratio: 25 (ref 12–28)
BUN: 17 mg/dL (ref 8–27)
Bilirubin Total: 0.5 mg/dL (ref 0.0–1.2)
CHLORIDE: 103 mmol/L (ref 96–106)
CO2: 21 mmol/L (ref 20–29)
Calcium: 9.5 mg/dL (ref 8.7–10.3)
Creatinine, Ser: 0.69 mg/dL (ref 0.57–1.00)
GFR calc Af Amer: 104 mL/min/{1.73_m2} (ref >59)
GFR calc non Af Amer: 90 mL/min/{1.73_m2} (ref >59)
GLOBULIN, TOTAL: 3.1 g/dL (ref 1.5–4.5)
GLUCOSE: 98 mg/dL (ref 65–99)
POTASSIUM: 4.6 mmol/L (ref 3.5–5.2)
SODIUM: 139 mmol/L (ref 134–144)
Total Protein: 7.3 g/dL (ref 6.0–8.5)

## 2017-06-29 LAB — CBC WITH DIFFERENTIAL/PLATELET
BASOS: 0 %
Basophils Absolute: 0 10*3/uL (ref 0.0–0.2)
EOS (ABSOLUTE): 0.1 10*3/uL (ref 0.0–0.4)
EOS: 1 %
HEMATOCRIT: 40.6 % (ref 34.0–46.6)
HEMOGLOBIN: 13.5 g/dL (ref 11.1–15.9)
IMMATURE GRANULOCYTES: 0 %
Immature Grans (Abs): 0 10*3/uL (ref 0.0–0.1)
LYMPHS ABS: 2 10*3/uL (ref 0.7–3.1)
Lymphs: 17 %
MCH: 30.6 pg (ref 26.6–33.0)
MCHC: 33.3 g/dL (ref 31.5–35.7)
MCV: 92 fL (ref 79–97)
MONOCYTES: 7 %
MONOS ABS: 0.8 10*3/uL (ref 0.1–0.9)
NEUTROS PCT: 75 %
Neutrophils Absolute: 9 10*3/uL — ABNORMAL HIGH (ref 1.4–7.0)
Platelets: 272 10*3/uL (ref 150–379)
RBC: 4.41 x10E6/uL (ref 3.77–5.28)
RDW: 13.7 % (ref 12.3–15.4)
WBC: 12 10*3/uL — ABNORMAL HIGH (ref 3.4–10.8)

## 2017-06-29 LAB — VITAMIN B12: Vitamin B-12: 516 pg/mL (ref 232–1245)

## 2017-06-29 LAB — TSH: TSH: 1.5 u[IU]/mL (ref 0.450–4.500)

## 2017-06-29 LAB — VITAMIN D 25 HYDROXY (VIT D DEFICIENCY, FRACTURES): Vit D, 25-Hydroxy: 27 ng/mL — ABNORMAL LOW (ref 30.0–100.0)

## 2017-06-29 NOTE — Telephone Encounter (Signed)
Phone call Discussed with patient blood work normal except for CBC which is slightly elevated patient did come down with a cold yesterday after leaving the office which could be related to elevated WBC count patient coming back next month will recheck CBC at that time.  Vitamin D still slightly low will double vitamin D for months then continue for another year.

## 2017-07-04 ENCOUNTER — Telehealth: Payer: Self-pay | Admitting: Gastroenterology

## 2017-07-04 NOTE — Telephone Encounter (Signed)
Needs to r/s her colonoscopy due to traveling.

## 2017-07-06 ENCOUNTER — Encounter: Payer: Self-pay | Admitting: Family Medicine

## 2017-07-06 ENCOUNTER — Ambulatory Visit (INDEPENDENT_AMBULATORY_CARE_PROVIDER_SITE_OTHER): Payer: Medicare Other | Admitting: Family Medicine

## 2017-07-06 VITALS — BP 114/74 | HR 76 | Temp 98.5°F | Ht 65.0 in | Wt 165.0 lb

## 2017-07-06 DIAGNOSIS — F411 Generalized anxiety disorder: Secondary | ICD-10-CM

## 2017-07-06 DIAGNOSIS — Z0000 Encounter for general adult medical examination without abnormal findings: Secondary | ICD-10-CM

## 2017-07-06 DIAGNOSIS — B009 Herpesviral infection, unspecified: Secondary | ICD-10-CM

## 2017-07-06 DIAGNOSIS — D229 Melanocytic nevi, unspecified: Secondary | ICD-10-CM | POA: Diagnosis not present

## 2017-07-06 DIAGNOSIS — D72829 Elevated white blood cell count, unspecified: Secondary | ICD-10-CM | POA: Diagnosis not present

## 2017-07-06 DIAGNOSIS — R110 Nausea: Secondary | ICD-10-CM

## 2017-07-06 MED ORDER — PANTOPRAZOLE SODIUM 40 MG PO TBEC: 40.0000 mg | DELAYED_RELEASE_TABLET | Freq: Every day | ORAL | 1 refills | Status: DC

## 2017-07-06 MED ORDER — CYCLOBENZAPRINE HCL 5 MG PO TABS: 5.0000 mg | ORAL_TABLET | Freq: Every day | ORAL | 0 refills | Status: DC

## 2017-07-06 MED ORDER — ACYCLOVIR 5 % EX OINT: 1.0000 "application " | TOPICAL_OINTMENT | CUTANEOUS | 1 refills | Status: DC

## 2017-07-06 MED ORDER — TEMAZEPAM 15 MG PO CAPS: ORAL_CAPSULE | ORAL | 0 refills | Status: DC

## 2017-07-06 NOTE — Progress Notes (Signed)
BP 114/74   Pulse 76   Temp 98.5 F (36.9 C)   Ht 5\' 5"  (1.651 m)   Wt 165 lb (74.8 kg)   SpO2 96%   BMI 27.46 kg/m    Subjective:    Patient ID: Catherine Hughes, female    DOB: 1951-05-29, 66 y.o.   MRN: [id number]  HPI: Catherine Hughes is a 66 y.o. female presenting on 07/06/2017 for comprehensive medical examination. Current medical complaints include:see below  Having intermittent nausea without vomiting . Pepto seems to help. Getting colonoscopy in a few weeks with GI. Has not tried any other OTC remedies.  Refusing labs today, wanting to wait until GI appt for that.   Cold sores - uses zovirax with good relief but tube is now several years old and expired.  Would like dermatology referral for skin checks.   Has been tapering off multiple controlled substances over the past year or so, still taking xanax and temazepam nightly for sleep. Wanting temazepam to be increased back up to 2 tabs nightly. Hoping to start tapering down next month. Just has a lot of stress in her life right now.   She currently lives with: daughter and grandchild Menopausal Symptoms: no  Depression Screen done today and results listed below:  Depression screen [facility] 2/9 06/28/2017 06/07/2017 02/13/2017 03/10/2016 12/29/2015  Decreased Interest 0 0 0 3 3  Down, Depressed, Hopeless 0 0 0 3 3  PHQ - 2 Score 0 0 0 6 6  Altered sleeping - - 0 3 3  Tired, decreased energy - - 3 3 3   Change in appetite - - 0 3 3  Feeling bad or failure about yourself  - - 0 3 3  Trouble concentrating - - 0 3 3  Moving slowly or fidgety/restless - - 0 1 3  Suicidal thoughts - - 0 0 0  PHQ-9 Score - - 3 22 24   Difficult doing work/chores - - - Not difficult at all Very difficult    The patient does not have a history of falls. I did not complete a risk assessment for falls. A plan of care for falls was not documented.   Past Medical History:  Past Medical History:  Diagnosis Date  . Anxiety   . Arthritis   .  Bipolar 1 disorder ([place])   . Depression   . Fibromyalgia   . Glaucoma   . Menopausal state   . Obesity (BMI 30.0-34.9)   . Pneumonia     Surgical History:  Past Surgical History:  Procedure Laterality Date  . APPENDECTOMY    . BREAST SURGERY Bilateral implants and removal  . FRACTURE SURGERY Right 2014   ankle  . SPINE SURGERY  L4-5   fusion and coil surgery    Medications:  Current Outpatient Prescriptions on File Prior to Visit  Medication Sig  . ALPRAZolam (XANAX) 1 MG tablet Take 1 tablet (1 mg total) by mouth 3 (three) times daily as needed for anxiety.  . Cholecalciferol (VITAMIN D3) 2000 units capsule Take 1 capsule (2,000 Units total) by mouth daily.  . Multiple Vitamin (MULTIVITAMIN) tablet Take 1 tablet by mouth daily.   No current facility-administered medications on file prior to visit.     Allergies:  Allergies  Allergen Reactions  . Tape Rash  . Morphine And Related     Morphine Contractions  . Terfenadine Palpitations    Social History:  Social History   Social History  . Marital status:  Married    Spouse name: N/A  . Number of children: N/A  . Years of education: N/A   Occupational History  . Not on file.   Social History Main Topics  . Smoking status: Former Smoker    Quit date: 03/18/1989  . Smokeless tobacco: Never Used  . Alcohol use 0.0 oz/week     Comment: rarely  . Drug use: No  . Sexual activity: Not Currently   Other Topics Concern  . Not on file   Social History Narrative  . No narrative on file   History  Smoking Status  . Former Smoker  . Quit date: 03/18/1989  Smokeless Tobacco  . Never Used   History  Alcohol Use  . 0.0 oz/week    Comment: rarely    Family History:  Family History  Problem Relation Age of Onset  . Cancer Mother        colon  . Diabetes Mother   . Coronary artery disease Mother   . Parkinson's disease Father   . Kidney disease Neg Hx     Past medical history, surgical history,  medications, allergies, family history and social history reviewed with patient today and changes made to appropriate areas of the chart.   Review of Systems - General ROS: negative Psychological ROS: positive for - anxiety and sleep disturbances ENT ROS: negative Breast ROS: negative for breast lumps Respiratory ROS: no cough, shortness of breath, or wheezing Cardiovascular ROS: no chest pain or dyspnea on exertion Gastrointestinal ROS: positive for - nausea, belching Genito-Urinary ROS: no dysuria, trouble voiding, or hematuria Musculoskeletal ROS: negative Neurological ROS: no TIA or stroke symptoms Dermatological ROS: negative All other ROS negative except what is listed above and in the HPI.      Objective:    BP 114/74   Pulse 76   Temp 98.5 F (36.9 C)   Ht 5\' 5"  (1.651 m)   Wt 165 lb (74.8 kg)   SpO2 96%   BMI 27.46 kg/m   Wt Readings from Last 3 Encounters:  07/06/17 165 lb (74.8 kg)  06/28/17 164 lb (74.4 kg)  06/07/17 162 lb 1.6 oz (73.5 kg)    Physical Exam  Constitutional: She is oriented to person, place, and time. She appears well-developed and well-nourished. No distress.  HENT:  Head: Atraumatic.  Right Ear: External ear normal.  Left Ear: External ear normal.  Nose: Nose normal.  Mouth/Throat: Oropharynx is clear and moist. No oropharyngeal exudate.  Eyes: Pupils are equal, round, and reactive to light. Conjunctivae are normal. No scleral icterus.  Neck: Normal range of motion. Neck supple. No thyromegaly present.  Cardiovascular: Normal rate, regular rhythm, normal heart sounds and intact distal pulses.   Pulmonary/Chest: Effort normal and breath sounds normal. No respiratory distress. Right breast exhibits no mass and no tenderness. Left breast exhibits no mass and no tenderness.  Abdominal: Soft. Bowel sounds are normal. She exhibits no mass. There is no tenderness.  Musculoskeletal: Normal range of motion. She exhibits no edema or tenderness.    Lymphadenopathy:    She has no cervical adenopathy.    She has no axillary adenopathy.  Neurological: She is alert and oriented to person, place, and time. No cranial nerve deficit.  Skin: Skin is warm and dry. No rash noted.  Psychiatric: She has a normal mood and affect. Her behavior is normal.  Nursing note and vitals reviewed.     Assessment & Plan:   Problem List Items Addressed This Visit  Other   Generalized anxiety disorder - Primary (Chronic)    Reviewed importance of minimizing use of xanax and temazepam, and not to use them together even for sleep. Will not increase temazepam. Continue taking 1 QHS prn with goal of tapering starting next month. Will stagger with xanax at least 6 hours apart due to sedation risks etc. Precautions reviewed at length.        Other Visit Diagnoses    Annual physical exam       Leukocytosis, unspecified type       Noted in recent labs. Will recheck today   Nevus       Dermatology referral placed for skin checks annually   Relevant Orders   Ambulatory referral to Dermatology   Nausea       Suspect mild gastritis, will start protonix x 4-6 weeks, bland diet. F/u with GI on progress    HSV infection       Zovirax renewed for prn use   Relevant Medications   acyclovir ointment (ZOVIRAX) 5 %       Follow up plan: Return in about 3 months (around 10/05/2017) for Anxiety f/u.   LABORATORY TESTING:  - Pap smear: not applicable  IMMUNIZATIONS:   - Tdap: Tetanus vaccination status reviewed: last tetanus booster within 10 years. - Influenza: postponed - Pneumovax: Up to date - Prevnar: Up to date - Zostavax vaccine: postponed  SCREENING: -Mammogram: ordered  - Colonoscopy: scheduled for next month  - Bone Density: ordered   PATIENT COUNSELING:   Advised to take 1 mg of folate supplement per day if capable of pregnancy.   Sexuality: Discussed sexually transmitted diseases, partner selection, use of condoms, avoidance of  unintended pregnancy  and contraceptive alternatives.   Advised to avoid cigarette smoking.  I discussed with the patient that most people either abstain from alcohol or drink within safe limits (<=14/week and <=4 drinks/occasion for males, <=7/weeks and <= 3 drinks/occasion for females) and that the risk for alcohol disorders and other health effects rises proportionally with the number of drinks per week and how often a drinker exceeds daily limits.  Discussed cessation/primary prevention of drug use and availability of treatment for abuse.   Diet: Encouraged to adjust caloric intake to maintain  or achieve ideal body weight, to reduce intake of dietary saturated fat and total fat, to limit sodium intake by avoiding high sodium foods and not adding table salt, and to maintain adequate dietary potassium and calcium preferably from fresh fruits, vegetables, and low-fat dairy products.    stressed the importance of regular exercise  Injury prevention: Discussed safety belts, safety helmets, smoke detector, smoking near bedding or upholstery.   Dental health: Discussed importance of regular tooth brushing, flossing, and dental visits.    NEXT PREVENTATIVE PHYSICAL DUE IN 1 YEAR. Return in about 3 months (around 10/05/2017) for Anxiety f/u.

## 2017-07-09 NOTE — Assessment & Plan Note (Signed)
Reviewed importance of minimizing use of xanax and temazepam, and not to use them together even for sleep. Will not increase temazepam. Continue taking 1 QHS prn with goal of tapering starting next month. Will stagger with xanax at least 6 hours apart due to sedation risks etc. Precautions reviewed at length.

## 2017-07-18 ENCOUNTER — Ambulatory Visit (INDEPENDENT_AMBULATORY_CARE_PROVIDER_SITE_OTHER): Payer: Medicare Other | Admitting: Gastroenterology

## 2017-07-18 ENCOUNTER — Encounter (INDEPENDENT_AMBULATORY_CARE_PROVIDER_SITE_OTHER): Payer: Self-pay

## 2017-07-18 ENCOUNTER — Encounter: Payer: Self-pay | Admitting: Gastroenterology

## 2017-07-18 VITALS — BP 100/68 | HR 77 | Temp 98.2°F | Ht 65.0 in | Wt 168.2 lb

## 2017-07-18 DIAGNOSIS — Z86010 Personal history of colonic polyps: Secondary | ICD-10-CM | POA: Diagnosis not present

## 2017-07-18 DIAGNOSIS — R194 Change in bowel habit: Secondary | ICD-10-CM

## 2017-07-18 NOTE — Addendum Note (Signed)
Addended by: Peggye Ley on: 07/18/2017 02:30 PM   Modules accepted: Orders, SmartSet

## 2017-07-18 NOTE — Progress Notes (Signed)
Jonathon Bellows MD, MRCP(U.K) [place]  [place],  [address]  Main: [phone]  Fax: [phone]   Gastroenterology Consultation  Referring Provider:     Guadalupe Maple, MD Primary Care Physician:  Guadalupe Maple, MD Primary Gastroenterologist:  Dr. Jonathon Bellows  Reason for Consultation:     Nausea         HPI:   Catherine Hughes is a 66 y.o. y/o female referred for consultation & management  by Dr. Guadalupe Maple, MD.  Labs 06/28/17- CMP,TSH,CBC-normal .   She had her last colonoscopy in 2008 and found a polyp and says she had a polyp and was advised to have a colonoscopy in 5 years. She says she did have some nausea for about 3 months , was given some medications sounds like Protonix and it resolved all her symptoms. At present no issues.   She says her bowel movements have changed in size to narrower in caliber, no blood in her stools, some intentional weight loss. Not going to the "bathroom as often ".   Some epigastric pain ongoing since for the past 3 months which is better with the PPi, did also have some lower abdominal pain too that has also resolved.   Past Medical History:  Diagnosis Date  . Anxiety   . Arthritis   . Bipolar 1 disorder ([place])   . Depression   . Fibromyalgia   . Glaucoma   . Menopausal state   . Obesity (BMI 30.0-34.9)   . Pneumonia     Past Surgical History:  Procedure Laterality Date  . APPENDECTOMY    . BREAST SURGERY Bilateral implants and removal  . FRACTURE SURGERY Right 2014   ankle  . SPINE SURGERY  L4-5   fusion and coil surgery    Prior to Admission medications   Medication Sig Start Date End Date Taking? Authorizing Provider  acyclovir ointment (ZOVIRAX) 5 % Apply 1 application topically every 3 (three) hours. 07/06/17  Yes Volney American, PA-C  ALPRAZolam Duanne Moron) 1 MG tablet Take 1 tablet (1 mg total) by mouth 3 (three) times daily as needed for anxiety. 06/01/17  Yes Crissman, Jeannette How, MD    Cholecalciferol (VITAMIN D3) 2000 units capsule Take 1 capsule (2,000 Units total) by mouth daily. 03/10/16  Yes Milinda Pointer, MD  cyclobenzaprine (FLEXERIL) 5 MG tablet Take 1 tablet (5 mg total) by mouth at bedtime. 07/06/17  Yes Volney American, PA-C  Multiple Vitamin (MULTIVITAMIN) tablet Take 1 tablet by mouth daily.   Yes [provider]  pantoprazole (PROTONIX) 40 MG tablet Take 1 tablet (40 mg total) by mouth daily. 07/06/17  Yes Volney American, PA-C  temazepam (RESTORIL) 15 MG capsule Take 1 tablet nightly as needed for sleep 07/06/17  Yes Volney American, PA-C  ibuprofen (ADVIL,MOTRIN) 600 MG tablet TK 1 T PO  Q 6 TO 8 H PRN P 07/10/17   [provider]    Family History  Problem Relation Age of Onset  . Cancer Mother        colon  . Diabetes Mother   . Coronary artery disease Mother   . Parkinson's disease Father   . Kidney disease Neg Hx      Social History  Substance Use Topics  . Smoking status: Former Smoker    Quit date: 03/18/1989  . Smokeless tobacco: Never Used  . Alcohol use 0.0 oz/week     Comment: rarely  Allergies as of 07/18/2017 - Review Complete 07/18/2017  Allergen Reaction Noted  . Tape Rash 12/02/2015  . Morphine and related  12/14/2015  . Terfenadine Palpitations 12/21/2016    Review of Systems:    All systems reviewed and negative except where noted in HPI.   Physical Exam:  BP 100/68   Pulse 77   Temp 98.2 F (36.8 C) (Oral)   Ht 5\' 5"  (1.651 m)   Wt 168 lb 3.2 oz (76.3 kg)   BMI 27.99 kg/m  No LMP recorded. Patient is postmenopausal. Psych:  Alert and cooperative. Normal mood and affect. General:   Alert,  Well-developed, well-nourished, pleasant and cooperative in NAD Head:  Normocephalic and atraumatic. Eyes:  Sclera clear, no icterus.   Conjunctiva pink. Ears:  Normal auditory acuity. Nose:  No deformity, discharge, or lesions. Mouth:  No deformity or lesions,oropharynx pink &  moist. Neck:  Supple; no masses or thyromegaly. Lungs:  Respirations even and unlabored.  Clear throughout to auscultation.   No wheezes, crackles, or rhonchi. No acute distress. Heart:  Regular rate and rhythm; no murmurs, clicks, rubs, or gallops. Abdomen:  Normal bowel sounds.  No bruits.  Soft, non-tender and non-distended without masses, hepatosplenomegaly or hernias noted.  No guarding or rebound tenderness.    Msk:  Symmetrical without gross deformities. Good, equal movement & strength bilaterally. Skin:  Intact without significant lesions or rashes. No jaundice. Lymph Nodes:  No significant cervical adenopathy. Psych:  Alert and cooperative. Normal mood and affect.  Imaging Studies: No results found.  Assessment and Plan:   Catherine Hughes is a 66 y.o. y/o female has been referred for nausea which has resolved with PPI use. She does have a change in bowel habits with a change in stool caliber. She is due for a colonoscopy which I will arrange.  I have discussed alternative options, risks & benefits,  which include, but are not limited to, bleeding, infection, perforation,respiratory complication & drug reaction.  The patient agrees with this plan & written consent will be obtained.     Follow up in PRN  Dr Jonathon Bellows MD,MRCP(U.K)

## 2017-07-20 ENCOUNTER — Ambulatory Visit: Admission: RE | Admit: 2017-07-20 | Payer: Medicare Other | Source: Ambulatory Visit | Admitting: Gastroenterology

## 2017-07-20 ENCOUNTER — Encounter: Payer: Medicare Other | Admitting: Family Medicine

## 2017-07-20 ENCOUNTER — Encounter: Admission: RE | Payer: Self-pay | Source: Ambulatory Visit

## 2017-07-20 SURGERY — COLONOSCOPY WITH PROPOFOL
Anesthesia: General

## 2017-07-25 ENCOUNTER — Ambulatory Visit: Payer: Medicare Other | Admitting: Anesthesiology

## 2017-07-25 ENCOUNTER — Ambulatory Visit
Admission: RE | Admit: 2017-07-25 | Discharge: 2017-07-25 | Disposition: A | Discharge status: Home / Self Care | Payer: Medicare Other | Source: Ambulatory Visit | Attending: Gastroenterology | Admitting: Gastroenterology

## 2017-07-25 ENCOUNTER — Encounter
Admission: RE | Disposition: A | Discharge status: Home / Self Care | Payer: Self-pay | Source: Ambulatory Visit | Attending: Gastroenterology

## 2017-07-25 ENCOUNTER — Encounter: Payer: Self-pay | Admitting: *Deleted

## 2017-07-25 DIAGNOSIS — Z79899 Other long term (current) drug therapy: Secondary | ICD-10-CM | POA: Insufficient documentation

## 2017-07-25 DIAGNOSIS — R194 Change in bowel habit: Secondary | ICD-10-CM | POA: Diagnosis not present

## 2017-07-25 DIAGNOSIS — M1990 Unspecified osteoarthritis, unspecified site: Secondary | ICD-10-CM | POA: Diagnosis not present

## 2017-07-25 DIAGNOSIS — D123 Benign neoplasm of transverse colon: Secondary | ICD-10-CM

## 2017-07-25 DIAGNOSIS — R7982 Elevated C-reactive protein (CRP): Secondary | ICD-10-CM

## 2017-07-25 DIAGNOSIS — K648 Other hemorrhoids: Secondary | ICD-10-CM | POA: Insufficient documentation

## 2017-07-25 DIAGNOSIS — R51 Headache: Secondary | ICD-10-CM | POA: Diagnosis not present

## 2017-07-25 DIAGNOSIS — M797 Fibromyalgia: Secondary | ICD-10-CM | POA: Insufficient documentation

## 2017-07-25 DIAGNOSIS — F419 Anxiety disorder, unspecified: Secondary | ICD-10-CM | POA: Insufficient documentation

## 2017-07-25 DIAGNOSIS — H409 Unspecified glaucoma: Secondary | ICD-10-CM | POA: Insufficient documentation

## 2017-07-25 DIAGNOSIS — F319 Bipolar disorder, unspecified: Secondary | ICD-10-CM | POA: Diagnosis not present

## 2017-07-25 DIAGNOSIS — Z87891 Personal history of nicotine dependence: Secondary | ICD-10-CM | POA: Diagnosis not present

## 2017-07-25 DIAGNOSIS — K5730 Diverticulosis of large intestine without perforation or abscess without bleeding: Secondary | ICD-10-CM | POA: Diagnosis not present

## 2017-07-25 DIAGNOSIS — Z86010 Personal history of colonic polyps: Secondary | ICD-10-CM

## 2017-07-25 DIAGNOSIS — K635 Polyp of colon: Secondary | ICD-10-CM | POA: Diagnosis not present

## 2017-07-25 HISTORY — PX: COLONOSCOPY WITH PROPOFOL: SHX5780

## 2017-07-25 SURGERY — COLONOSCOPY WITH PROPOFOL
Anesthesia: General

## 2017-07-25 MED ORDER — SODIUM CHLORIDE 0.9 % IV SOLN
INTRAVENOUS | Status: DC
  Administered 2017-07-25: 11:00:00 via INTRAVENOUS

## 2017-07-25 MED ORDER — PROPOFOL 10 MG/ML IV BOLUS
INTRAVENOUS | Status: DC | PRN
  Administered 2017-07-25: 70 mg via INTRAVENOUS

## 2017-07-25 MED ORDER — LIDOCAINE HCL (PF) 2 % IJ SOLN
INTRAMUSCULAR | Status: AC
  Filled 2017-07-25: qty 10

## 2017-07-25 MED ORDER — PROPOFOL 500 MG/50ML IV EMUL
INTRAVENOUS | Status: AC
  Filled 2017-07-25: qty 50

## 2017-07-25 MED ORDER — LIDOCAINE HCL (CARDIAC) 20 MG/ML IV SOLN
INTRAVENOUS | Status: DC | PRN
  Administered 2017-07-25: 50 mg via INTRAVENOUS

## 2017-07-25 MED ORDER — PROPOFOL 500 MG/50ML IV EMUL
INTRAVENOUS | Status: DC | PRN
  Administered 2017-07-25: 150 ug/kg/min via INTRAVENOUS

## 2017-07-25 NOTE — Transfer of Care (Signed)
Immediate Anesthesia Transfer of Care Note  Patient: Catherine Hughes  Procedure(s) Performed: COLONOSCOPY WITH PROPOFOL (N/A )  Patient Location: PACU  Anesthesia Type:General  Level of Consciousness: sedated  Airway & Oxygen Therapy: Patient Spontanous Breathing and Patient connected to nasal cannula oxygen  Post-op Assessment: Report given to RN and Post -op Vital signs reviewed and stable  Post vital signs: Reviewed and stable  Last Vitals:  Vitals:   07/25/17 1152 07/25/17 1155  BP: (!) 95/59 (!) 95/59  Pulse: 71 68  Resp: (!) 21 15  Temp: (!) 35.9 C   SpO2: 99% 100%    Last Pain:  Vitals:   07/25/17 1152  TempSrc: Tympanic         Complications: No apparent anesthesia complications

## 2017-07-25 NOTE — Anesthesia Preprocedure Evaluation (Signed)
Anesthesia Evaluation  Patient identified by MRN, date of birth, ID band Patient awake    Reviewed: Allergy & Precautions, H&P , NPO status , Patient's Chart, lab work & pertinent test results, reviewed documented beta blocker date and time   Airway Mallampati: II   Neck ROM: full    Dental  (+) Poor Dentition   Pulmonary neg pulmonary ROS, pneumonia, former smoker,    Pulmonary exam normal        Cardiovascular negative cardio ROS Normal cardiovascular exam Rhythm:regular Rate:Normal     Neuro/Psych  Headaches, PSYCHIATRIC DISORDERS  Neuromuscular disease negative neurological ROS  negative psych ROS   GI/Hepatic negative GI ROS, Neg liver ROS,   Endo/Other  negative endocrine ROS  Renal/GU negative Renal ROS  negative genitourinary   Musculoskeletal   Abdominal   Peds  Hematology negative hematology ROS (+)   Anesthesia Other Findings Past Medical History: No date: Anxiety No date: Arthritis No date: Bipolar 1 disorder ([facility]) No date: Depression No date: Fibromyalgia No date: Glaucoma No date: Menopausal state No date: Obesity (BMI 30.0-34.9) No date: Pneumonia Past Surgical History: No date: APPENDECTOMY implants and removal: BREAST SURGERY; Bilateral 2014: FRACTURE SURGERY; Right     Comment:  ankle L4-5: SPINE SURGERY     Comment:  fusion and coil surgery BMI    Body Mass Index:  27.12 kg/m     Reproductive/Obstetrics negative OB ROS                             Anesthesia Physical Anesthesia Plan  ASA: III  Anesthesia Plan: General   Post-op Pain Management:    Induction:   PONV Risk Score and Plan:   Airway Management Planned:   Additional Equipment:   Intra-op Plan:   Post-operative Plan:   Informed Consent: I have reviewed the patients History and Physical, chart, labs and discussed the procedure including the risks, benefits and alternatives for the  proposed anesthesia with the patient or authorized representative who has indicated his/her understanding and acceptance.   Dental Advisory Given  Plan Discussed with: CRNA  Anesthesia Plan Comments:         Anesthesia Quick Evaluation

## 2017-07-25 NOTE — Anesthesia Procedure Notes (Signed)
Performed by: Jonisha Kindig Pre-anesthesia Checklist: Patient identified, Emergency Drugs available, Suction available, Patient being monitored and Timeout performed Patient Re-evaluated:Patient Re-evaluated prior to induction Oxygen Delivery Method: Nasal cannula Induction Type: IV induction       

## 2017-07-25 NOTE — H&P (Signed)
Jonathon Bellows MD [address] [place], [place] [address] Phone: [phone] Fax : [phone]  Primary Care Physician:  Guadalupe Maple, MD Primary Gastroenterologist:  Dr. Jonathon Bellows   Pre-Procedure History & Physical: HPI:  Catherine Hughes is a 66 y.o. female is here for an colonoscopy.   Past Medical History:  Diagnosis Date  . Anxiety   . Arthritis   . Bipolar 1 disorder ([place])   . Depression   . Fibromyalgia   . Glaucoma   . Menopausal state   . Obesity (BMI 30.0-34.9)   . Pneumonia     Past Surgical History:  Procedure Laterality Date  . APPENDECTOMY    . BREAST SURGERY Bilateral implants and removal  . FRACTURE SURGERY Right 2014   ankle  . SPINE SURGERY  L4-5   fusion and coil surgery    Prior to Admission medications   Medication Sig Start Date End Date Taking? Authorizing Provider  ALPRAZolam Duanne Moron) 1 MG tablet Take 1 tablet (1 mg total) by mouth 3 (three) times daily as needed for anxiety. 06/01/17  Yes Crissman, Jeannette How, MD  Cholecalciferol (VITAMIN D3) 2000 units capsule Take 1 capsule (2,000 Units total) by mouth daily. 03/10/16  Yes Milinda Pointer, MD  cyclobenzaprine (FLEXERIL) 5 MG tablet Take 1 tablet (5 mg total) by mouth at bedtime. 07/06/17  Yes Volney American, PA-C  ibuprofen (ADVIL,MOTRIN) 600 MG tablet TK 1 T PO  Q 6 TO 8 H PRN P 07/10/17  Yes [provider]  Multiple Vitamin (MULTIVITAMIN) tablet Take 1 tablet by mouth daily.   Yes [provider]  pantoprazole (PROTONIX) 40 MG tablet Take 1 tablet (40 mg total) by mouth daily. 07/06/17  Yes Volney American, PA-C  temazepam (RESTORIL) 15 MG capsule Take 1 tablet nightly as needed for sleep 07/06/17  Yes Volney American, PA-C  acyclovir ointment (ZOVIRAX) 5 % Apply 1 application topically every 3 (three) hours. 07/06/17   Volney American, PA-C    Allergies as of 07/18/2017 - Review Complete 07/18/2017  Allergen Reaction Noted  . Tape Rash  12/02/2015  . Morphine and related  12/14/2015  . Terfenadine Palpitations 12/21/2016    Family History  Problem Relation Age of Onset  . Cancer Mother        colon  . Diabetes Mother   . Coronary artery disease Mother   . Parkinson's disease Father   . Kidney disease Neg Hx     Social History   Social History  . Marital status: Married    Spouse name: N/A  . Number of children: N/A  . Years of education: N/A   Occupational History  . Not on file.   Social History Main Topics  . Smoking status: Former Smoker    Quit date: 03/18/1989  . Smokeless tobacco: Never Used  . Alcohol use 0.0 oz/week     Comment: rarely  . Drug use: No  . Sexual activity: Not Currently   Other Topics Concern  . Not on file   Social History Narrative  . No narrative on file    Review of Systems: See HPI, otherwise negative ROS  Physical Exam: BP 117/77   Pulse 68   Temp (!) 95.4 F (35.2 C) (Tympanic)   Resp 20   Ht 5\' 5"  (1.651 m)   Wt 163 lb (73.9 kg)   SpO2 100%   BMI 27.12 kg/m  General:   Alert,  pleasant and cooperative in NAD  Head:  Normocephalic and atraumatic. Neck:  Supple; no masses or thyromegaly. Lungs:  Clear throughout to auscultation.    Heart:  Regular rate and rhythm. Abdomen:  Soft, nontender and nondistended. Normal bowel sounds, without guarding, and without rebound.   Neurologic:  Alert and  oriented x4;  grossly normal neurologically.  Impression/Plan: Catherine Hughes is here for an colonoscopy to be performed for change in bowel habits   Risks, benefits, limitations, and alternatives regarding  colonoscopy have been reviewed with the patient.  Questions have been answered.  All parties agreeable.   Jonathon Bellows, MD  07/25/2017, 10:52 AM

## 2017-07-25 NOTE — Op Note (Signed)
[facility] Gastroenterology Patient Name: Catherine Hughes Procedure Date: 07/25/2017 11:21 AM MRN: [id number] Account #: [id number] Date of Birth: [birth year] Admit Type: Outpatient Age: 66 Room: [facility] ENDO ROOM 1 Gender: Female Note Status: Finalized Procedure:            Colonoscopy Indications:          Change in bowel habits Providers:            Jonathon Bellows MD, MD Referring MD:         Guadalupe Maple, MD (Referring MD) Complications:        No immediate complications. Procedure:            Pre-Anesthesia Assessment:                       - Prior to the procedure, a History and Physical was                        performed, and patient medications, allergies and                        sensitivities were reviewed. The patient's tolerance of                        previous anesthesia was reviewed.                       - The risks and benefits of the procedure and the                        sedation options and risks were discussed with the                        patient. All questions were answered and informed                        consent was obtained.                       - ASA Grade Assessment: III - A patient with severe                        systemic disease.                       After obtaining informed consent, the colonoscope was                        passed under direct vision. Throughout the procedure,                        the patient's blood pressure, pulse, and oxygen                        saturations were monitored continuously. The                        Colonoscope was introduced through the anus and                        advanced to the the cecum, identified by the  appendiceal orifice, IC valve and transillumination.                        The colonoscopy was performed with ease. The patient                        tolerated the procedure well. The quality of the bowel                        preparation was  good. Findings:      A 3 mm polyp was found in the transverse colon. The polyp was sessile.       The polyp was removed with a cold biopsy forceps. Resection and       retrieval were complete.      The entire examined colon appeared normal on direct and retroflexion       views. Impression:           - One 3 mm polyp in the transverse colon, removed with                        a cold biopsy forceps. Resected and retrieved.                       - The entire examined colon is normal on direct and                        retroflexion views. Recommendation:       - Discharge patient to home (with escort).                       - Resume previous diet.                       - Continue present medications.                       - Await pathology results.                       - Repeat colonoscopy in 5 years for surveillance. Procedure Code(s):    --- Professional ---                       [phone], Colonoscopy, flexible; with biopsy, single or                        multiple Diagnosis Code(s):    --- Professional ---                       D12.3, Benign neoplasm of transverse colon (hepatic                        flexure or splenic flexure)                       R19.4, Change in bowel habit CPT copyright 2016 American Medical Association. All rights reserved. The codes documented in this report are preliminary and upon coder review may  be revised to meet current compliance requirements. Jonathon Bellows, MD Jonathon Bellows MD, MD 07/25/2017 11:52:47 AM This report has been signed electronically. Number of Addenda: 0 Note Initiated On:  07/25/2017 11:21 AM Scope Withdrawal Time: 0 hours 13 minutes 31 seconds  Total Procedure Duration: 0 hours 21 minutes 24 seconds       [facility]

## 2017-07-25 NOTE — Anesthesia Post-op Follow-up Note (Signed)
Anesthesia QCDR form completed.        

## 2017-07-26 ENCOUNTER — Encounter: Payer: Self-pay | Admitting: Gastroenterology

## 2017-07-26 LAB — HEPATITIS C ANTIBODY: HCV AB: 1.3 {s_co_ratio} — AB (ref 0.0–0.9)

## 2017-07-26 LAB — SURGICAL PATHOLOGY

## 2017-07-26 NOTE — Anesthesia Postprocedure Evaluation (Signed)
Anesthesia Post Note  Patient: Catherine Hughes  Procedure(s) Performed: COLONOSCOPY WITH PROPOFOL (N/A )  Patient location during evaluation: PACU Anesthesia Type: General Level of consciousness: awake and alert Pain management: pain level controlled Vital Signs Assessment: post-procedure vital signs reviewed and stable Respiratory status: spontaneous breathing, nonlabored ventilation, respiratory function stable and patient connected to nasal cannula oxygen Cardiovascular status: blood pressure returned to baseline and stable Postop Assessment: no apparent nausea or vomiting Anesthetic complications: no     Last Vitals:  Vitals:   07/25/17 1212 07/25/17 1222  BP: 126/72 116/78  Pulse: 64 (!) 58  Resp: (!) 22 17  Temp:    SpO2: 100% 99%    Last Pain:  Vitals:   07/25/17 1152  TempSrc: Tympanic                 Molli Barrows

## 2017-07-27 LAB — HEPATITIS C VRS RNA DETECT BY PCR-QUAL: Hepatitis C Vrs RNA by PCR-Qual: NEGATIVE

## 2017-07-28 ENCOUNTER — Telehealth: Payer: Self-pay

## 2017-07-28 NOTE — Telephone Encounter (Signed)
-----   Message from Jonathon Bellows, MD sent at 07/27/2017 11:26 AM EDT ----- Inform her labs suggest that she has had hepatitis c in the past but does not have it presently - cured

## 2017-07-28 NOTE — Telephone Encounter (Signed)
LVM for callback for results per Dr. Vicente Males.

## 2017-08-03 ENCOUNTER — Encounter: Payer: Self-pay | Admitting: Gastroenterology

## 2017-08-08 ENCOUNTER — Other Ambulatory Visit: Payer: Self-pay | Admitting: Family Medicine

## 2017-08-31 ENCOUNTER — Ambulatory Visit: Payer: Medicare Other | Admitting: Family Medicine

## 2017-09-04 ENCOUNTER — Encounter: Payer: Self-pay | Admitting: Family Medicine

## 2017-09-04 ENCOUNTER — Ambulatory Visit: Payer: Medicare Other | Admitting: Family Medicine

## 2017-09-04 DIAGNOSIS — F411 Generalized anxiety disorder: Secondary | ICD-10-CM

## 2017-09-04 DIAGNOSIS — G4700 Insomnia, unspecified: Secondary | ICD-10-CM | POA: Diagnosis not present

## 2017-09-04 MED ORDER — CLONAZEPAM 1 MG PO TABS: 1.0000 mg | ORAL_TABLET | Freq: Two times a day (BID) | ORAL | 1 refills | Status: DC | PRN

## 2017-09-04 MED ORDER — DIAZEPAM 5 MG PO TABS: 5.0000 mg | ORAL_TABLET | Freq: Two times a day (BID) | ORAL | 0 refills | Status: DC | PRN

## 2017-09-04 NOTE — Assessment & Plan Note (Addendum)
Discussed generalized anxiety patient takingXanax and is been on Xanax for years. Will not be able to switch without concerned about seizures. Interested in ultimately switching to Valium which may have good muscle relaxing properties along with anxiety reductionIn this vein will switch to clonazepam 1 mg 3 times a day for one month.Then make a transition of1 clonazepam to 1 Valium over a period of 3 weeks.

## 2017-09-04 NOTE — Progress Notes (Signed)
BP 112/75   Pulse 80   Wt 164 lb (74.4 kg)   SpO2 99%   BMI 27.29 kg/m    Subjective:    Patient ID: Catherine Hughes, female    DOB: 1951-09-23, 66 y.o.   MRN: [id number]  HPI: Catherine Hughes is a 66 y.o. female  Chief Complaint  Patient presents with  . Medication Dose Change   Discussed with patient biggest problems are chronic anxiety which is being treated with Xanax adequately. Takes 3 mg most days. Next most prominent problem is poor sleep. Patient's been using Restoril 30 mg mostly for years. Concerned now about combination of medications with Restoril and Xanax with sleep suppression. And of decreased Restoril to 15 mg which isn't adequate. Patient also interested in some type of muscle relaxer reviewed muscle relaxers and interaction with Xanax with again respiratory suppression. Discuss options with other medications and at this point not willing to consider antidepressants unable to use satisfactorily over-the-counter Benadryl like medications for sleep area did has used Soma in the past and that seemed to work well but concerned about possible drug interactions and Respiratory suppression. Of note patient had previously been on narcotics and is been off of all narcotics for about a year and a half. Also been off antidepressants. Of note patient still has significant pain.  Relevant past medical, surgical, family and social history reviewed and updated as indicated. Interim medical history since our last visit reviewed. Allergies and medications reviewed and updated.  Review of Systems  Constitutional: Negative.   Respiratory: Negative.   Cardiovascular: Negative.     Per HPI unless specifically indicated above     Objective:    BP 112/75   Pulse 80   Wt 164 lb (74.4 kg)   SpO2 99%   BMI 27.29 kg/m   Wt Readings from Last 3 Encounters:  09/04/17 164 lb (74.4 kg)  07/25/17 163 lb (73.9 kg)  07/18/17 168 lb 3.2 oz (76.3 kg)    Physical Exam    Constitutional: She is oriented to person, place, and time. She appears well-developed and well-nourished.  HENT:  Head: Normocephalic and atraumatic.  Right Ear: External ear normal.  Left Ear: External ear normal.  Nose: Nose normal.  Mouth/Throat: Oropharynx is clear and moist.  Eyes: Conjunctivae and EOM are normal. Pupils are equal, round, and reactive to light.  Neck: Normal range of motion. Neck supple. Carotid bruit is not present.  Cardiovascular: Normal rate, regular rhythm and normal heart sounds.  No murmur heard. Pulmonary/Chest: Effort normal and breath sounds normal. She exhibits no mass. Right breast exhibits no mass, no skin change and no tenderness. Left breast exhibits no mass, no skin change and no tenderness. Breasts are symmetrical.  Abdominal: Soft. Bowel sounds are normal. There is no hepatosplenomegaly.  Musculoskeletal: Normal range of motion.  Neurological: She is alert and oriented to person, place, and time.  Skin: No rash noted.  Psychiatric: She has a normal mood and affect. Her behavior is normal. Judgment and thought content normal.    Results for orders placed or performed during the hospital encounter of 07/25/17  Hepatitis C antibody  Result Value Ref Range   HCV Ab 1.3 (H) 0.0 - 0.9 s/co ratio  Hepatitis c vrs RNA detect by PCR-qual  Result Value Ref Range   Hepatitis C Vrs RNA by PCR-Qual Negative Negative  Surgical pathology  Result Value Ref Range   SURGICAL PATHOLOGY      Surgical Pathology  CASE: [id number] PATIENT: Catherine Hughes Surgical Pathology Report     SPECIMEN SUBMITTED: A. Colon polyp, transverse; cbx  CLINICAL HISTORY: None provided  PRE-OPERATIVE DIAGNOSIS: HX of polyps, Z86.010  POST-OPERATIVE DIAGNOSIS: Diverticulosis; colon polyp:  internal hemorrhoids     DIAGNOSIS: A. COLON POLYP, TRANSVERSE; COLD BIOPSY: - TUBULAR ADENOMA. - NEGATIVE FOR HIGH-GRADE DYSPLASIA AND MALIGNANCY.   GROSS  DESCRIPTION: A. Labeled: transverse colon polyp C BX Tissue fragment(s): 1 Size: 0.3 cm Description: tan fragment  Entirely submitted in 1 cassette.  Final Diagnosis performed by Bryan Lemma, MD.  Electronically signed 07/26/2017 9:06:13PM    The electronic signature indicates that the named Attending Pathologist has evaluated the specimen  Technical component performed at [facility], [address], [place], [place] [address] Lab: [phone] Dir: Darrick Penna. Evette Doffing, MD  Professional component performed at [facility] , [facility], [place], [place], [place] [address] Lab: [phone] Dir: Dellia Nims. Reuel Derby, MD        Assessment & Plan:   Problem List Items Addressed This Visit      Other   Generalized anxiety disorder (Chronic)    Discussed generalized anxiety patient takingXanax and is been on Xanax for years. Will not be able to switch without concerned about seizures. Interested in ultimately switching to Valium which may have good muscle relaxing properties along with anxiety reductionIn this vein will switch to clonazepam 1 mg 3 times a day for one month.Then make a transition of1 clonazepam to 1 Valium over a period of 3 weeks.      Relevant Medications   diazepam (VALIUM) 5 MG tablet   Insomnia    Reviewed insomnia and limitations of treatment patient may want to consider melatonin. Discussed other antidepressants which patient is not amenable to considering and has intolerance to Benadryl due to hangover affect.        gave written directions on transition from Klonopin to Valium from Xanax  Follow up plan: Return in about 6 weeks (around 10/16/2017), or if symptoms worsen or fail to improve, for med check.

## 2017-09-04 NOTE — Assessment & Plan Note (Signed)
Reviewed insomnia and limitations of treatment patient may want to consider melatonin. Discussed other antidepressants which patient is not amenable to considering and has intolerance to Benadryl due to hangover affect.

## 2017-10-05 ENCOUNTER — Ambulatory Visit: Payer: Self-pay | Admitting: Family Medicine

## 2017-10-20 ENCOUNTER — Emergency Department: Payer: Medicare Other

## 2017-10-20 ENCOUNTER — Observation Stay
Admission: EM | Admit: 2017-10-20 | Discharge: 2017-10-22 | Disposition: A | Discharge status: Home / Self Care | Payer: Medicare Other | Attending: Surgery | Admitting: Surgery

## 2017-10-20 ENCOUNTER — Other Ambulatory Visit: Payer: Self-pay

## 2017-10-20 ENCOUNTER — Ambulatory Visit: Payer: Self-pay | Admitting: *Deleted

## 2017-10-20 ENCOUNTER — Encounter: Payer: Self-pay | Admitting: *Deleted

## 2017-10-20 DIAGNOSIS — K812 Acute cholecystitis with chronic cholecystitis: Secondary | ICD-10-CM | POA: Diagnosis not present

## 2017-10-20 DIAGNOSIS — M1990 Unspecified osteoarthritis, unspecified site: Secondary | ICD-10-CM | POA: Diagnosis not present

## 2017-10-20 DIAGNOSIS — R079 Chest pain, unspecified: Secondary | ICD-10-CM

## 2017-10-20 DIAGNOSIS — K6389 Other specified diseases of intestine: Secondary | ICD-10-CM | POA: Insufficient documentation

## 2017-10-20 DIAGNOSIS — I509 Heart failure, unspecified: Secondary | ICD-10-CM | POA: Diagnosis not present

## 2017-10-20 DIAGNOSIS — K8012 Calculus of gallbladder with acute and chronic cholecystitis without obstruction: Secondary | ICD-10-CM | POA: Diagnosis not present

## 2017-10-20 DIAGNOSIS — K8020 Calculus of gallbladder without cholecystitis without obstruction: Secondary | ICD-10-CM | POA: Diagnosis not present

## 2017-10-20 DIAGNOSIS — R109 Unspecified abdominal pain: Secondary | ICD-10-CM

## 2017-10-20 DIAGNOSIS — I2489 Other forms of acute ischemic heart disease: Secondary | ICD-10-CM

## 2017-10-20 DIAGNOSIS — Z79899 Other long term (current) drug therapy: Secondary | ICD-10-CM | POA: Insufficient documentation

## 2017-10-20 DIAGNOSIS — R1013 Epigastric pain: Secondary | ICD-10-CM | POA: Diagnosis not present

## 2017-10-20 DIAGNOSIS — Z8249 Family history of ischemic heart disease and other diseases of the circulatory system: Secondary | ICD-10-CM | POA: Diagnosis not present

## 2017-10-20 DIAGNOSIS — R7989 Other specified abnormal findings of blood chemistry: Secondary | ICD-10-CM | POA: Diagnosis not present

## 2017-10-20 DIAGNOSIS — Z888 Allergy status to other drugs, medicaments and biological substances status: Secondary | ICD-10-CM | POA: Insufficient documentation

## 2017-10-20 DIAGNOSIS — Z885 Allergy status to narcotic agent status: Secondary | ICD-10-CM | POA: Diagnosis not present

## 2017-10-20 DIAGNOSIS — Z87891 Personal history of nicotine dependence: Secondary | ICD-10-CM | POA: Diagnosis not present

## 2017-10-20 DIAGNOSIS — M797 Fibromyalgia: Secondary | ICD-10-CM | POA: Insufficient documentation

## 2017-10-20 DIAGNOSIS — R1900 Intra-abdominal and pelvic swelling, mass and lump, unspecified site: Secondary | ICD-10-CM

## 2017-10-20 DIAGNOSIS — F419 Anxiety disorder, unspecified: Secondary | ICD-10-CM | POA: Insufficient documentation

## 2017-10-20 DIAGNOSIS — K810 Acute cholecystitis: Secondary | ICD-10-CM | POA: Diagnosis present

## 2017-10-20 DIAGNOSIS — F319 Bipolar disorder, unspecified: Secondary | ICD-10-CM | POA: Insufficient documentation

## 2017-10-20 DIAGNOSIS — R112 Nausea with vomiting, unspecified: Secondary | ICD-10-CM | POA: Diagnosis not present

## 2017-10-20 DIAGNOSIS — Z01818 Encounter for other preprocedural examination: Secondary | ICD-10-CM

## 2017-10-20 DIAGNOSIS — I248 Other forms of acute ischemic heart disease: Secondary | ICD-10-CM

## 2017-10-20 DIAGNOSIS — R778 Other specified abnormalities of plasma proteins: Secondary | ICD-10-CM

## 2017-10-20 DIAGNOSIS — Z1211 Encounter for screening for malignant neoplasm of colon: Secondary | ICD-10-CM

## 2017-10-20 LAB — COMPREHENSIVE METABOLIC PANEL
ALBUMIN: 4 g/dL (ref 3.5–5.0)
ALT: 17 U/L (ref 14–54)
AST: 18 U/L (ref 15–41)
Alkaline Phosphatase: 88 U/L (ref 38–126)
Anion gap: 10 (ref 5–15)
BILIRUBIN TOTAL: 0.8 mg/dL (ref 0.3–1.2)
BUN: 13 mg/dL (ref 6–20)
CALCIUM: 9.6 mg/dL (ref 8.9–10.3)
CO2: 26 mmol/L (ref 22–32)
CREATININE: 0.7 mg/dL (ref 0.44–1.00)
Chloride: 102 mmol/L (ref 101–111)
GFR calc Af Amer: >60 mL/min (ref >60)
GLUCOSE: 108 mg/dL — AB (ref 65–99)
POTASSIUM: 4 mmol/L (ref 3.5–5.1)
Sodium: 138 mmol/L (ref 135–145)
TOTAL PROTEIN: 7.8 g/dL (ref 6.5–8.1)

## 2017-10-20 LAB — URINALYSIS, COMPLETE (UACMP) WITH MICROSCOPIC
BACTERIA UA: NONE SEEN
BILIRUBIN URINE: NEGATIVE
Glucose, UA: NEGATIVE mg/dL
Hgb urine dipstick: NEGATIVE
KETONES UR: NEGATIVE mg/dL
LEUKOCYTES UA: NEGATIVE
NITRITE: NEGATIVE
Protein, ur: NEGATIVE mg/dL
SPECIFIC GRAVITY, URINE: 1.002 — AB (ref 1.005–1.030)
pH: 6 (ref 5.0–8.0)

## 2017-10-20 LAB — TROPONIN I
TROPONIN I: 0.08 ng/mL — AB (ref <0.03)
Troponin I: 0.09 ng/mL (ref <0.03)

## 2017-10-20 LAB — CBC
HEMATOCRIT: 39.7 % (ref 35.0–47.0)
Hemoglobin: 13.2 g/dL (ref 12.0–16.0)
MCH: 30.8 pg (ref 26.0–34.0)
MCHC: 33.2 g/dL (ref 32.0–36.0)
MCV: 92.6 fL (ref 80.0–100.0)
Platelets: 353 10*3/uL (ref 150–440)
RBC: 4.29 MIL/uL (ref 3.80–5.20)
RDW: 13 % (ref 11.5–14.5)
WBC: 7.3 10*3/uL (ref 3.6–11.0)

## 2017-10-20 LAB — LIPASE, BLOOD: Lipase: 32 U/L (ref 11–51)

## 2017-10-20 MED ORDER — ONDANSETRON 4 MG PO TBDP: 4.0000 mg | ORAL_TABLET | Freq: Four times a day (QID) | ORAL | Status: DC | PRN

## 2017-10-20 MED ORDER — PANTOPRAZOLE SODIUM 40 MG IV SOLR
40.0000 mg | Freq: Every day | INTRAVENOUS | Status: DC
  Administered 2017-10-20 – 2017-10-21 (×2): 40 mg via INTRAVENOUS
  Filled 2017-10-20 (×2): qty 40

## 2017-10-20 MED ORDER — HYDROMORPHONE HCL 1 MG/ML IJ SOLN
0.5000 mg | INTRAMUSCULAR | Status: DC | PRN
  Administered 2017-10-20 – 2017-10-22 (×7): 0.5 mg via INTRAVENOUS
  Filled 2017-10-20 (×8): qty 0.5

## 2017-10-20 MED ORDER — TEMAZEPAM 15 MG PO CAPS
15.0000 mg | ORAL_CAPSULE | Freq: Every day | ORAL | Status: AC
  Administered 2017-10-20: 15 mg via ORAL
  Filled 2017-10-20: qty 1

## 2017-10-20 MED ORDER — IOPAMIDOL (ISOVUE-370) INJECTION 76%
100.0000 mL | Freq: Once | INTRAVENOUS | Status: AC | PRN
  Administered 2017-10-20: 100 mL via INTRAVENOUS

## 2017-10-20 MED ORDER — PIPERACILLIN-TAZOBACTAM 3.375 G IVPB
3.3750 g | Freq: Three times a day (TID) | INTRAVENOUS | Status: DC
  Administered 2017-10-21 – 2017-10-22 (×5): 3.375 g via INTRAVENOUS
  Filled 2017-10-20 (×5): qty 50

## 2017-10-20 MED ORDER — ALPRAZOLAM 0.5 MG PO TABS
1.0000 mg | ORAL_TABLET | Freq: Every day | ORAL | Status: AC
  Administered 2017-10-20: 1 mg via ORAL
  Filled 2017-10-20: qty 2

## 2017-10-20 MED ORDER — FENTANYL CITRATE (PF) 100 MCG/2ML IJ SOLN
50.0000 ug | Freq: Once | INTRAMUSCULAR | Status: AC
  Administered 2017-10-20: 50 ug via INTRAVENOUS
  Filled 2017-10-20: qty 2

## 2017-10-20 MED ORDER — KETOROLAC TROMETHAMINE 15 MG/ML IJ SOLN
15.0000 mg | Freq: Once | INTRAMUSCULAR | Status: AC
  Administered 2017-10-20: 15 mg via INTRAVENOUS
  Filled 2017-10-20: qty 1

## 2017-10-20 MED ORDER — ENOXAPARIN SODIUM 40 MG/0.4ML ~~LOC~~ SOLN
40.0000 mg | SUBCUTANEOUS | Status: DC
  Administered 2017-10-22: 40 mg via SUBCUTANEOUS
  Filled 2017-10-20: qty 0.4

## 2017-10-20 MED ORDER — DEXTROSE IN LACTATED RINGERS 5 % IV SOLN
INTRAVENOUS | Status: DC
  Administered 2017-10-20 – 2017-10-21 (×2): via INTRAVENOUS

## 2017-10-20 MED ORDER — ONDANSETRON HCL 4 MG/2ML IJ SOLN
4.0000 mg | Freq: Once | INTRAMUSCULAR | Status: AC
  Administered 2017-10-20: 4 mg via INTRAVENOUS
  Filled 2017-10-20: qty 2

## 2017-10-20 MED ORDER — SODIUM CHLORIDE 0.9 % IV BOLUS (SEPSIS)
500.0000 mL | Freq: Once | INTRAVENOUS | Status: AC
  Administered 2017-10-20: 500 mL via INTRAVENOUS

## 2017-10-20 MED ORDER — PIPERACILLIN-TAZOBACTAM 3.375 G IVPB
3.3750 g | Freq: Three times a day (TID) | INTRAVENOUS | Status: DC
  Administered 2017-10-20: 3.375 g via INTRAVENOUS

## 2017-10-20 MED ORDER — PIPERACILLIN-TAZOBACTAM 3.375 G IVPB 30 MIN
3.3750 g | Freq: Once | INTRAVENOUS | Status: AC
  Administered 2017-10-20: 3.375 g via INTRAVENOUS
  Filled 2017-10-20: qty 50

## 2017-10-20 MED ORDER — ONDANSETRON HCL 4 MG/2ML IJ SOLN
4.0000 mg | Freq: Four times a day (QID) | INTRAMUSCULAR | Status: DC | PRN
  Administered 2017-10-21 (×2): 4 mg via INTRAVENOUS
  Filled 2017-10-20 (×2): qty 2

## 2017-10-20 MED ORDER — IOPAMIDOL (ISOVUE-300) INJECTION 61%
30.0000 mL | Freq: Once | INTRAVENOUS | Status: AC | PRN
  Administered 2017-10-20: 30 mL via ORAL

## 2017-10-20 NOTE — ED Notes (Signed)
Pt presents tday via POV with husband at bedside. Pt states the pain started 10/11/17. Pt reports it felt like I was having a "heart attack." Pt states it felt like her bones were squeezing her to death. Pt says it has gotten worse and at no point has the pain has gone away. EKG performed EDP [place] given. Awaiting EDP

## 2017-10-20 NOTE — ED Notes (Signed)
Rn calling report at this time. Report was given to [employer]

## 2017-10-20 NOTE — H&P (Signed)
SURGICAL HISTORY & PHYSICAL (cpt [phone])  HISTORY OF PRESENT ILLNESS (HPI):  67 y.o. female presented to [facility] ED today for abdominal pain. Patient reports she first began experiencing upper abdominal pain the day after Christmas. On Christmas Day, she also experienced nausea and a single episode of non-bloody emesis. Since then, she describes awaking every morning with a severe headache and nausea, which improve as her upper abdominal pain worsens throughout each day. She initially thought her symptoms were related to her trying to self-discontinue her TID Xanax (more recently only qhs) rather than bridging discontinuation of her Xanax with Klonopin and Valium as she says she was advised by her PMD when she saw them 1 month ago. She denies any prior episodes of post-prandial RUQ abdominal pain, though recalls an episode of severe N/V and upper abdominal pain 1 year ago, for which she was diagnosed with irritable bowel syndrome, and her symptoms reportedly resolved with 30 days of a medication unknown to patient. Patient says the fentanyl she's been receiving in the ED has not helped with her pain or nausea, and she denies fever/chills, CP, or SOB. She also states she is able to walk up/down 1 - 2 flights of steps without CP, SOB, or having to stop and can walk >1 - 2 blocks likewise.  Of note, patient says morphine made her feel "pins and needles" and "stiff like a board" (no hives/rash or airway difficulties), but she reports having tolerated and experienced pain relief with dilaudid during and after her two prior spine surgeries.  PAST MEDICAL HISTORY (PMH):  Past Medical History:  Diagnosis Date  . Anxiety   . Arthritis   . Bipolar 1 disorder ([place])   . Depression   . Fibromyalgia   . Glaucoma   . Menopausal state   . Obesity (BMI 30.0-34.9)   . Pneumonia     Reviewed. Otherwise negative.   PAST SURGICAL HISTORY ([place]):  Past Surgical History:  Procedure Laterality Date  . APPENDECTOMY      Open appendectomy (as a child)  . BREAST SURGERY Bilateral implants and removal  . COLONOSCOPY WITH PROPOFOL N/A 07/25/2017   Procedure: COLONOSCOPY WITH PROPOFOL;  Surgeon: Jonathon Bellows, MD;  Location: [facility] ENDOSCOPY;  Service: Gastroenterology;  Laterality: N/A;  . FRACTURE SURGERY Right 2014   ankle  . SPINE SURGERY  L4-5   fusion and coil surgery (anterior approach)    Reviewed. Otherwise negative.   MEDICATIONS:  Prior to Admission medications   Medication Sig Start Date End Date Taking? Authorizing Provider  acidophilus (RISAQUAD) CAPS capsule Take 1 capsule by mouth daily.   Yes [provider]  acyclovir ointment (ZOVIRAX) 5 % Apply 1 application topically every 3 (three) hours. 07/06/17  Yes Volney American, PA-C  ALPRAZolam Duanne Moron) 1 MG tablet Take 1 mg by mouth 3 (three) times daily as needed for anxiety.   Yes [provider]  Cholecalciferol (VITAMIN D3) 2000 units capsule Take 1 capsule (2,000 Units total) by mouth daily. 03/10/16  Yes Milinda Pointer, MD  cyclobenzaprine (FLEXERIL) 5 MG tablet Take 5 mg by mouth daily as needed. 09/04/17  Yes [provider]  diazepam (VALIUM) 5 MG tablet Take 1 tablet (5 mg total) every 12 (twelve) hours as needed by mouth for anxiety. 09/04/17  Yes Crissman, Jeannette How, MD  ibuprofen (ADVIL,MOTRIN) 600 MG tablet TK 1 T PO  Q 6 TO 8 H PRN P 07/10/17  Yes [provider]  Multiple Vitamin (MULTIVITAMIN) tablet Take 1  tablet by mouth daily.   Yes [provider]  pantoprazole (PROTONIX) 40 MG tablet Take 1 tablet (40 mg total) by mouth daily. 07/06/17  Yes Volney American, PA-C  temazepam (RESTORIL) 15 MG capsule Take 1 tablet nightly as needed for sleep 07/06/17  Yes Volney American, PA-C  clonazePAM (KLONOPIN) 1 MG tablet Take 1 tablet (1 mg total) 2 (two) times daily as needed by mouth for anxiety. Patient not taking: Reported on 10/20/2017 09/04/17   Guadalupe Maple, MD      ALLERGIES:  Allergies  Allergen Reactions  . Tape Rash  . Morphine And Related     Morphine Contractions  . Terfenadine Palpitations     SOCIAL HISTORY:  Social History   Socioeconomic History  . Marital status: Married    Spouse name: Not on file  . Number of children: Not on file  . Years of education: Not on file  . Highest education level: Not on file  Social Needs  . Financial resource strain: Not on file  . Food insecurity - worry: Not on file  . Food insecurity - inability: Not on file  . Transportation needs - medical: Not on file  . Transportation needs - non-medical: Not on file  Occupational History  . Not on file  Tobacco Use  . Smoking status: Former Smoker    Last attempt to quit: 03/18/1989    Years since quitting: 28.6  . Smokeless tobacco: Never Used  Substance and Sexual Activity  . Alcohol use: Yes    Alcohol/week: 0.0 oz    Comment: rarely  . Drug use: No  . Sexual activity: Not Currently  Other Topics Concern  . Not on file  Social History Narrative  . Not on file    The patient currently resides (home / rehab facility / nursing home): Home The patient normally is (ambulatory / bedbound): Ambulatory  FAMILY HISTORY:  Family History  Problem Relation Age of Onset  . Cancer Mother        colon  . Diabetes Mother   . Coronary artery disease Mother   . Parkinson's disease Father   . Kidney disease Neg Hx     Otherwise negative.   REVIEW OF SYSTEMS:  Constitutional: denies any other weight loss, fever, chills, or sweats  Eyes: denies any other vision changes, history of eye injury  ENT: denies sore throat, hearing problems  Respiratory: denies shortness of breath, wheezing  Cardiovascular: denies chest pain, palpitations  Gastrointestinal: abdominal pain, N/V, and bowel function as per HPI  Genitourinary: denies burning with urination or urinary frequency Musculoskeletal: denies any other joint pains or cramps  Skin: Denies any other  rashes or skin discolorations  Neurological: denies any other headache, dizziness, weakness  Psychiatric: denies any other depression, anxiety   All other review of systems were otherwise negative.  VITAL SIGNS:  Temp:  [98.1 F (36.7 C)] 98.1 F (36.7 C) (01/04 1451) Pulse Rate:  [71] 71 (01/04 1332) Resp:  [16] 16 (01/04 1332) BP: (116)/(58) 116/58 (01/04 1332) SpO2:  [98 %] 98 % (01/04 1332) Weight:  [163 lb (73.9 kg)] 163 lb (73.9 kg) (01/04 1333)     Height: 5\' 5"  (165.1 cm) Weight: 163 lb (73.9 kg) BMI (Calculated): 27.12   INTAKE/OUTPUT:  This shift: No intake/output data recorded.  Last 2 shifts: @IOLAST2SHIFTS @  PHYSICAL EXAM:  Constitutional:  -- Normal body habitus  -- Awake, alert, and oriented x3, no apparent distress Eyes:  -- Pupils  equally round and reactive to light  -- No scleral icterus, B/L no occular discharge Ear, nose, throat: -- Neck is FROM WNL -- No jugular venous distension  Pulmonary:  -- No wheezes or rhales -- Equal breath sounds bilaterally -- Breathing non-labored at rest Cardiovascular:  -- S1, S2 present  -- No pericardial rubs  Gastrointestinal:  -- Abdomen soft and non-distended with moderate Right mid-lower abdominal tenderness to palpation, no guarding or rebound tenderness -- Well-healed oblique RLQ linear surgical incision and vertical paramedian incision extending from Left of patient's pubis to above umbilicus -- No abdominal masses appreciated, pulsatile or otherwise  Musculoskeletal and Integumentary:  -- Wounds or skin discoloration: None appreciated except well-healed remote surgical incisions as described above (GI) -- Extremities: B/L UE and LE FROM, hands and feet warm, no edema  Neurologic:  -- Motor function: Intact and symmetric -- Sensation: Intact and symmetric Psychiatric:  -- Mood and affect WNL  Labs:  CBC Latest Ref Rng & Units 10/20/2017 06/28/2017 01/18/2012  WBC 3.6 - 11.0 K/uL 7.3 12.0(H) 9.7  Hemoglobin  12.0 - 16.0 g/dL 13.2 13.5 10.4(L)  Hematocrit 35.0 - 47.0 % 39.7 40.6 31.6(L)  Platelets 150 - 440 K/uL 353 272 203   CMP Latest Ref Rng & Units 10/20/2017 06/28/2017 12/07/2015  Glucose 65 - 99 mg/dL 108(H) 98 104(H)  BUN 6 - 20 mg/dL 13 17 16   Creatinine 0.44 - 1.00 mg/dL 0.70 0.69 0.72  Sodium 135 - 145 mmol/L 138 139 137  Potassium 3.5 - 5.1 mmol/L 4.0 4.6 4.4  Chloride 101 - 111 mmol/L 102 103 107  CO2 22 - 32 mmol/L 26 21 26   Calcium 8.9 - 10.3 mg/dL 9.6 9.5 9.5  Total Protein 6.5 - 8.1 g/dL 7.8 7.3 7.4  Total Bilirubin 0.3 - 1.2 mg/dL 0.8 0.5 0.6  Alkaline Phos 38 - 126 U/L 88 93 67  AST 15 - 41 U/L 18 19 21   ALT 14 - 54 U/L 17 15 20    Cardiac markers:  Lab Results  Component Value Date   CKMB 1.2 01/17/2012   Troponin (10/20/2017): 0.09  Imaging studies:  CT Abdomen and Pelvis with Contrast (10/20/2017) Subcentimeter cystic density in the central liver. The  gallbladder is enlarged and contains high-density calculi,  including at the neck. The wall is diffusely thickened and mucosa  is prominently enhancing. The neighboring fat is not particularly inflamed. Mild intrahepatic and extrahepatic bile duct prominence without visible calcified stone. Common bile duct: 9 mm.  Clustered cystic densities in the right lower quadrant are  near the diseased gallbladder but are likely unrelated as there  is no superimposed inflammation. These may be postoperative  inclusion cysts/seromas given right lower quadrant abdominal  wall sutures. Please correlate with surgical history.  3 cm left ovarian cyst with small mural calcification. Recommend nonurgent pelvic ultrasound in this postmenopausal patient.  CTA Chest (10/20/2017) Normal heart size. No pericardial effusion. Mild atheromatous changes to the aorta. There is no convincing acute pulmonary embolism, an apparent filling defect within right upper lobe branch on 7:108 is most likely streak artifact from neighboring intravenous  contrast, which also affects other right upper lobe branches. No mediastinal adenopathy or mass.   Assessment/Plan: (ICD-10's: K62.2) 67 y.o. female with subacute calculous cholecystitis with an enormous elongated gallbladder extending to patient's Right lower quadrant (adjacent to her former appendix), complicated by elevated Troponin 0.09 (likely representing demand ischemia) and by pertinent comorbidities including osteoarthritis, glaucoma, fibromyalgia, generalized anxiety disorder, and bipolar type  1 disorder/major depression disorder.    - will admit to surgical service  - NPO, IVF, IV antibiotics (Zosyn), and pain control prn  - medical and cardiology consultations requested for pre-anesthesia risk stratification and optimization considering Troponin 0.09  - all risks, benefits, and alternatives to laparoscopic, possible open, cholecystectomy were discussed with the patient and her husband, all of their questions were answered to their expressed satisfaction, patient expresses she wishes to proceed, and informed consent was obtained.  - anticipate laparoscopic, possible open (considering prior surgeries), cholecystectomy pending medical and cardiology consultations  - medical management of comorbidities as per home medications and medical team  - DVT prophylaxis, ambulation encouraged  All of the above findings and recommendations were discussed with the patient and her husband, and all of her and family's questions were answered to their expressed satisfaction.  -- Marilynne Drivers Rosana Hoes, MD, [place]: [facility] - Partnering for exceptional care. Office: [phone]

## 2017-10-20 NOTE — ED Notes (Signed)
Pt made a comment during triage that she has been feeling SOB and has been taking shallow breaths because she has pain in upper abd and back when taking a breath.

## 2017-10-20 NOTE — ED Notes (Signed)
Date and time results received: 10/20/17 1417 (use smartphrase ".now" to insert current time)  Test: troponin Critical Value: 0.09  Name of Provider Notified: Opal Sidles charge rn

## 2017-10-20 NOTE — ED Triage Notes (Addendum)
Pt to ED reporting she has had generalized upper abd pain since after christmas. Pt reports having had a similar pain before and was dx with irritable bowel. Pt is also reporting rib and back pain as well.   Pt reports she has only had 1 episode of vomiting in December but has had nausea and pain chronically since. Pt also report having had a headache intermittently. No neuro deficits noted.

## 2017-10-20 NOTE — ED Provider Notes (Addendum)
[facility] Emergency Department Provider Note  ____________________________________________   I have reviewed the triage vital signs and the nursing notes. Where available I have reviewed prior notes and, if possible and indicated, outside hospital notes.    HISTORY  Chief Complaint Abdominal Pain    HPI Catherine Hughes is a 67 y.o. female who presents today complaining of pain from her umbilicus to her head.  She states she has had this pain nonstop every minute of the day for the last 10 or 12 days.  She states it began the day before Christmas when she ate some raw red cabbage and since that time "she has not been right".  She denies any fever chills, she states she has some shortness of breath, she states when she takes a deep breath it hurts in her abdomen, her most of her pain is a sharp pain in her abdomen which is constant.  Is not worse with eating.  She has had decreased p.o.  She has not vomited since December, this is early January, but she states she has been feeling nauseated.  Normal bowel movements no melena no bright red blood per rectum.  She has had no calf pain swelling or recent travel but she states her mother did have DVTs.  She denies any shortness of breath but she states it hurts in her abdomen to take a deep breath so she is not taking deep breaths.  She also has been having intermittent headaches.  She has not seen her primary care doctor sought any care for this or taken anything that she can think of at home.  She was hoping that it would all go away.  She was hoping that it was because of some cabbage she ate.   Location: Mostly epigastric right upper quadrant Radiation: "Everywhere " Quality: Sharp, burning, Duration: For 11 days Timing: All the time Severity: Significant Associated sxs: Nausea, hurts to breathe see above PriorTreatment : None   Past Medical History:  Diagnosis Date  . Anxiety   . Arthritis   . Bipolar 1  disorder ([place])   . Depression   . Fibromyalgia   . Glaucoma   . Menopausal state   . Obesity (BMI 30.0-34.9)   . Pneumonia     Patient Active Problem List   Diagnosis Date Noted  . Chronic fatigue 06/28/2017  . Insomnia 02/13/2017  . Cervical paraspinal muscle spasm 02/13/2017  . Chronic prescription benzodiazepine use 12/29/2015  . Elevated C-reactive protein (CRP) 12/15/2015  . Vitamin D deficiency 12/15/2015  . Pelvic pain in female 12/14/2015  . Lumbar facet syndrome 12/04/2015  . Chronic lumbar radicular pain (Location of Secondary source of pain) (Bilateral) (L>R) (S1 Dermatome) 12/04/2015  . 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 Chronic headaches (Location of Tertiary source of pain) (Bilateral) (L>R) (distribution of the lesser occipital nerve) 12/04/2015  . Fibromyalgia 12/04/2015  . Neurogenic pain 12/04/2015  . History of chronic fatigue syndrome 12/04/2015  . Generalized anxiety disorder 12/04/2015  . Chronic pain syndrome 12/02/2015  . Chronic low back pain (Location of Primary Source of Pain) (Bilateral) (L>R) 12/02/2015  . Chronic lower extremity pain (Location of Secondary source of pain) (Bilateral) (L>R) 12/02/2015  . Cystocele, grade 2 07/19/2015  . Depression 07/13/2015  . Atrophic vaginitis 06/22/2015  . History of spinal fusion (L4-L5 and L5-S1) 03/19/2015    Past Surgical History:  Procedure Laterality Date  . APPENDECTOMY    . BREAST SURGERY  Bilateral implants and removal  . COLONOSCOPY WITH PROPOFOL N/A 07/25/2017   Procedure: COLONOSCOPY WITH PROPOFOL;  Surgeon: Jonathon Bellows, MD;  Location: [facility] ENDOSCOPY;  Service: Gastroenterology;  Laterality: N/A;  . FRACTURE SURGERY Right 2014   ankle  . SPINE SURGERY  L4-5   fusion and coil surgery    Prior to Admission medications   Medication Sig Start Date End Date Taking? Authorizing Provider  acyclovir ointment (ZOVIRAX) 5 % Apply 1 application topically every 3 (three)  hours. 07/06/17   Volney American, PA-C  Cholecalciferol (VITAMIN D3) 2000 units capsule Take 1 capsule (2,000 Units total) by mouth daily. 03/10/16   Milinda Pointer, MD  clonazePAM (KLONOPIN) 1 MG tablet Take 1 tablet (1 mg total) 2 (two) times daily as needed by mouth for anxiety. 09/04/17   Guadalupe Maple, MD  diazepam (VALIUM) 5 MG tablet Take 1 tablet (5 mg total) every 12 (twelve) hours as needed by mouth for anxiety. 09/04/17   Guadalupe Maple, MD  ibuprofen (ADVIL,MOTRIN) 600 MG tablet TK 1 T PO  Q 6 TO 8 H PRN P 07/10/17   [provider]  Multiple Vitamin (MULTIVITAMIN) tablet Take 1 tablet by mouth daily.    [provider]  pantoprazole (PROTONIX) 40 MG tablet Take 1 tablet (40 mg total) by mouth daily. 07/06/17   Volney American, PA-C  temazepam (RESTORIL) 15 MG capsule Take 1 tablet nightly as needed for sleep 07/06/17   Volney American, PA-C    Allergies Tape; Morphine and related; and Terfenadine  Family History  Problem Relation Age of Onset  . Cancer Mother        colon  . Diabetes Mother   . Coronary artery disease Mother   . Parkinson's disease Father   . Kidney disease Neg Hx     Social History Social History   Tobacco Use  . Smoking status: Former Smoker    Last attempt to quit: 03/18/1989    Years since quitting: 28.6  . Smokeless tobacco: Never Used  Substance Use Topics  . Alcohol use: Yes    Alcohol/week: 0.0 oz    Comment: rarely  . Drug use: No    Review of Systems Constitutional: No fever/chills Eyes: No visual changes. ENT: No sore throat. No stiff neck no neck pain Cardiovascular: See HPI Respiratory: See HPI Gastrointestinal: See HPI Genitourinary: Negative for dysuria. Musculoskeletal: Negative lower extremity swelling Skin: Negative for rash. Neurological: Negative for severe headaches, focal weakness or numbness.   ____________________________________________   PHYSICAL EXAM:  VITAL  SIGNS: ED Triage Vitals  Enc Vitals Group     BP 10/20/17 1332 (!) 116/58     Pulse Rate 10/20/17 1332 71     Resp 10/20/17 1332 16     Temp 10/20/17 1451 98.1 F (36.7 C)     Temp Source 10/20/17 1451 Oral     SpO2 10/20/17 1332 98 %     Weight 10/20/17 1333 163 lb (73.9 kg)     Height 10/20/17 1333 5\' 5"  (1.651 m)     Head Circumference --      Peak Flow --      Pain Score 10/20/17 1332 10     Pain Loc --      Pain Edu? --      Excl. in [place]? --     Constitutional: Alert and oriented. Well appearing quite anxious and upset Eyes: Conjunctivae are normal Head: Atraumatic HEENT: No congestion/rhinnorhea. Mucous membranes are  moist.  Oropharynx non-erythematous Neck:   Nontender with no meningismus, no masses, no stridor Cardiovascular: Normal rate, regular rhythm. Grossly normal heart sounds.  Good peripheral circulation. Respiratory: Normal respiratory effort.  No retractions. Lungs CTAB. Abdominal: Soft and tenderness to palpation epigastric right upper quadrant with voluntary guarding nonsurgical abdomen,. No distention.no rebound Back:  There is no focal tenderness or step off.  there is no midline tenderness there are no lesions noted. there is no CVA tenderness Musculoskeletal: No lower extremity tenderness, no upper extremity tenderness. No joint effusions, no DVT signs strong distal pulses no edema Neurologic:  Normal speech and language. No gross focal neurologic deficits are appreciated.  Skin:  Skin is warm, dry and intact. No rash noted. Psychiatric: Mood and affect are normal. Speech and behavior are normal.  ____________________________________________   LABS (all labs ordered are listed, but only abnormal results are displayed)  Labs Reviewed  COMPREHENSIVE METABOLIC PANEL - Abnormal; Notable for the following components:      Result Value   Glucose, Bld 108 (*)    All other components within normal limits  TROPONIN I - Abnormal; Notable for the following  components:   Troponin I 0.09 (*)    All other components within normal limits  LIPASE, BLOOD  CBC  URINALYSIS, COMPLETE (UACMP) WITH MICROSCOPIC    Pertinent labs  results that were available during my care of the patient were reviewed by me and considered in my medical decision making (see chart for details). ____________________________________________  EKG  I personally interpreted any EKGs ordered by me or triage  EKGs were performed, the first shows sinus at 65 with no acute ST elevation or depression normal axis, unremarkable EKG, second shows sinus at 63, normal axis no acute ischemic changes ____________________________________________  RADIOLOGY  Pertinent labs & imaging results that were available during my care of the patient were reviewed by me and considered in my medical decision making (see chart for details). If possible, patient and/or family made aware of any abnormal findings.  No results found. ____________________________________________    PROCEDURES  Procedure(s) performed: None  Procedures  Critical Care performed: None  ____________________________________________   INITIAL IMPRESSION / ASSESSMENT AND PLAN / ED COURSE  Pertinent labs & imaging results that were available during my care of the patient were reviewed by me and considered in my medical decision making (see chart for details).  Patient here with reproducible epigastric and right upper quadrant abdominal pain certainly could be gallbladder disease or intra-abdominal pathology such as PUD etc.  Nonsurgical abdomen at this time concern for these entities certainly exist however at the same time patient is also complaining of chest pain and has a troponin which is 2.84 which is certainly above reference range.  No history of CAD.  Does not have a good history for CAD EKGs are reassuring, does not have exertional symptoms but she states she is short of breath this is poorly described,  sometimes it seems as if most likely it is because it hurts her abdomen to breathe nonetheless given a family history of DVT, will obtain CT scan of chest abdomen pelvis to further evaluate this patient with significant discomfort that is been there for some time.  Patient states that she gets nauseated with morphine but she will except fentanyl.  ----------------------------------------- 4:53 PM on 10/20/2017 -----------------------------------------  As expected, CT scan shows significant cholecystitis.  I have paged Dr. Rosana Hoes of surgery.  She does not a fever or a white count liver  function tests are unremarkable but CT scan is quite impressive.  There is apparently some glitch in getting the CT results to [place] I have asked them to fax them over but according to radiology there is no PE or intrathoracic pathology of any significance.  Second troponin is pending, given gallbladder and symptoms this is most likely a demand ischemia if not a lab error, we will see what the second 1 shows.  Dr. Rosana Hoes is following and will advise him when he wants me to do with this patient.    ____________________________________________   FINAL CLINICAL IMPRESSION(S) / ED DIAGNOSES  Final diagnoses:  None      This chart was dictated using voice recognition software.  Despite best efforts to proofread,  errors can occur which can change meaning.      Schuyler Amor, MD 10/20/17 1458    Schuyler Amor, MD 10/20/17 [phone]

## 2017-10-20 NOTE — Telephone Encounter (Signed)
Routing to provider, FYI.  

## 2017-10-20 NOTE — ED Notes (Signed)
Pt is a/ox4 awaiting admission.

## 2017-10-20 NOTE — Telephone Encounter (Signed)
Pt reports abdominal pain x 1 week; Onset after eating "a lot of gassy" food. gradual worsening, presently at 10/10; "Bent over with pain.Hard to walk." Pain extends over entire abd.radiates to back. Reports nausea, no emesis. Reports bowels moving "ok." Also reports abd. tender to touch. Directed to ED which pt declines "I can't wait in an ED." Pt states "I just need the medication I was on 2 years ago (Amitiza) called in." Explained to pt provider will not call in med without her being seen in office. Advised to go to UC now. Family will drive. Reason for Disposition . [1] SEVERE pain (e.g., excruciating) AND [2] present > 1 hour  Answer Assessment - Initial Assessment Questions 1. LOCATION: "Where does it hurt?"      Entire stomach 2. RADIATION: "Does the pain shoot anywhere else?" (e.g., chest, back)     Back, rib cage area 3. ONSET: "When did the pain begin?" (e.g., minutes, hours or days ago)      1 week ago, worse today 4. SUDDEN: "Gradual or sudden onset?"     Gradual after eating, worsening over week, worse today. 5. PATTERN "Does the pain come and go, or is it constant?"    - If constant: "Is it getting better, staying the same, or worsening?"      (Note: Constant means the pain never goes away completely; most serious pain is constant and it progresses)     - If intermittent: "How long does it last?" "Do you have pain now?"     (Note: Intermittent means the pain goes away completely between bouts)     Constant 6. SEVERITY: "How bad is the pain?"  (e.g., Scale 1-10; mild, moderate, or severe)   - MILD (1-3): doesn't interfere with normal activities, abdomen soft and not tender to touch    - MODERATE (4-7): interferes with normal activities or awakens from sleep, tender to touch    - SEVERE (8-10): excruciating pain, doubled over, unable to do any normal activities      10/10; bent over, can't walk. Reports wearing "back brace" to help with pain. 7. RECURRENT SYMPTOM: "Have you ever  had this type of abdominal pain before?" If so, ask: "When was the last time?" and "What happened that time?"      Yes. Seen Feb. 2017; Placed on Amitiza for 30 days. No recurrent symptoms since then 8. CAUSE: "What do you think is causing the abdominal pain?"    Feels the same as before, only worse. 9. RELIEVING/AGGRAVATING FACTORS: "What makes it better or worse?" (e.g., movement, antacids, bowel movement)    Nothing 10. OTHER SYMPTOMS: "Has there been any vomiting, diarrhea, constipation, or urine problems?"      Nausea, no emesis; states "bowels moving."  Protocols used: ABDOMINAL PAIN - [facility]

## 2017-10-21 ENCOUNTER — Encounter: Payer: Self-pay | Admitting: Anesthesiology

## 2017-10-21 ENCOUNTER — Other Ambulatory Visit: Payer: Self-pay

## 2017-10-21 ENCOUNTER — Observation Stay: Payer: Medicare Other | Admitting: Anesthesiology

## 2017-10-21 ENCOUNTER — Encounter
Admission: EM | Disposition: A | Discharge status: Home / Self Care | Payer: Self-pay | Source: Home / Self Care | Attending: Emergency Medicine

## 2017-10-21 DIAGNOSIS — Z01818 Encounter for other preprocedural examination: Secondary | ICD-10-CM

## 2017-10-21 DIAGNOSIS — K810 Acute cholecystitis: Secondary | ICD-10-CM | POA: Diagnosis not present

## 2017-10-21 DIAGNOSIS — R748 Abnormal levels of other serum enzymes: Secondary | ICD-10-CM | POA: Diagnosis not present

## 2017-10-21 DIAGNOSIS — K8010 Calculus of gallbladder with chronic cholecystitis without obstruction: Secondary | ICD-10-CM | POA: Diagnosis not present

## 2017-10-21 DIAGNOSIS — I248 Other forms of acute ischemic heart disease: Secondary | ICD-10-CM | POA: Diagnosis not present

## 2017-10-21 DIAGNOSIS — M1990 Unspecified osteoarthritis, unspecified site: Secondary | ICD-10-CM | POA: Diagnosis not present

## 2017-10-21 DIAGNOSIS — R1900 Intra-abdominal and pelvic swelling, mass and lump, unspecified site: Secondary | ICD-10-CM | POA: Diagnosis not present

## 2017-10-21 DIAGNOSIS — K660 Peritoneal adhesions (postprocedural) (postinfection): Secondary | ICD-10-CM | POA: Diagnosis not present

## 2017-10-21 DIAGNOSIS — K812 Acute cholecystitis with chronic cholecystitis: Secondary | ICD-10-CM | POA: Diagnosis not present

## 2017-10-21 DIAGNOSIS — R079 Chest pain, unspecified: Secondary | ICD-10-CM | POA: Diagnosis not present

## 2017-10-21 HISTORY — PX: CHOLECYSTECTOMY: SHX55

## 2017-10-21 LAB — COMPREHENSIVE METABOLIC PANEL
ALBUMIN: 3.4 g/dL — AB (ref 3.5–5.0)
ALK PHOS: 74 U/L (ref 38–126)
ALT: 12 U/L — ABNORMAL LOW (ref 14–54)
AST: 17 U/L (ref 15–41)
Anion gap: 6 (ref 5–15)
BUN: 12 mg/dL (ref 6–20)
CHLORIDE: 107 mmol/L (ref 101–111)
CO2: 27 mmol/L (ref 22–32)
Calcium: 9.1 mg/dL (ref 8.9–10.3)
Creatinine, Ser: 0.95 mg/dL (ref 0.44–1.00)
GFR calc non Af Amer: >60 mL/min (ref >60)
GLUCOSE: 123 mg/dL — AB (ref 65–99)
POTASSIUM: 4.2 mmol/L (ref 3.5–5.1)
SODIUM: 140 mmol/L (ref 135–145)
Total Bilirubin: 0.9 mg/dL (ref 0.3–1.2)
Total Protein: 6.9 g/dL (ref 6.5–8.1)

## 2017-10-21 LAB — TROPONIN I
TROPONIN I: 0.06 ng/mL — AB (ref <0.03)
Troponin I: 0.06 ng/mL (ref <0.03)

## 2017-10-21 LAB — CBC
HEMATOCRIT: 34.5 % — AB (ref 35.0–47.0)
Hemoglobin: 11.5 g/dL — ABNORMAL LOW (ref 12.0–16.0)
MCH: 30.9 pg (ref 26.0–34.0)
MCHC: 33.4 g/dL (ref 32.0–36.0)
MCV: 92.6 fL (ref 80.0–100.0)
Platelets: 284 10*3/uL (ref 150–440)
RBC: 3.73 MIL/uL — AB (ref 3.80–5.20)
RDW: 12.7 % (ref 11.5–14.5)
WBC: 6.4 10*3/uL (ref 3.6–11.0)

## 2017-10-21 LAB — SURGICAL PCR SCREEN
MRSA, PCR: NEGATIVE
Staphylococcus aureus: NEGATIVE

## 2017-10-21 SURGERY — LAPAROSCOPIC CHOLECYSTECTOMY
Anesthesia: General

## 2017-10-21 MED ORDER — DEXAMETHASONE SODIUM PHOSPHATE 10 MG/ML IJ SOLN
INTRAMUSCULAR | Status: DC | PRN
  Administered 2017-10-21: 10 mg via INTRAVENOUS

## 2017-10-21 MED ORDER — ACETAMINOPHEN 325 MG PO TABS: 650.0000 mg | ORAL_TABLET | Freq: Once | ORAL | Status: DC

## 2017-10-21 MED ORDER — LIDOCAINE HCL (PF) 1 % IJ SOLN
INTRAMUSCULAR | Status: AC
  Filled 2017-10-21: qty 30

## 2017-10-21 MED ORDER — TRAMADOL-ACETAMINOPHEN 37.5-325 MG PO TABS: 1.0000 | ORAL_TABLET | ORAL | 0 refills | Status: DC | PRN

## 2017-10-21 MED ORDER — ACETAMINOPHEN 10 MG/ML IV SOLN
INTRAVENOUS | Status: AC
  Filled 2017-10-21: qty 100

## 2017-10-21 MED ORDER — BUPIVACAINE HCL (PF) 0.5 % IJ SOLN
INTRAMUSCULAR | Status: AC
Start: 2017-10-21 — End: 2017-10-21
  Filled 2017-10-21: qty 30

## 2017-10-21 MED ORDER — TRAMADOL-ACETAMINOPHEN 37.5-325 MG PO TABS
1.0000 | ORAL_TABLET | ORAL | Status: DC | PRN
  Administered 2017-10-21 – 2017-10-22 (×3): 1 via ORAL
  Administered 2017-10-22: 2 via ORAL
  Administered 2017-10-22: 1 via ORAL
  Filled 2017-10-21 (×3): qty 1
  Filled 2017-10-21: qty 2
  Filled 2017-10-21 (×2): qty 1
  Filled 2017-10-21: qty 2

## 2017-10-21 MED ORDER — PHENYLEPHRINE HCL 10 MG/ML IJ SOLN
INTRAMUSCULAR | Status: DC | PRN
  Administered 2017-10-21: 100 ug via INTRAVENOUS

## 2017-10-21 MED ORDER — OXYCODONE HCL 5 MG/5ML PO SOLN: 5.0000 mg | Freq: Once | ORAL | Status: DC | PRN

## 2017-10-21 MED ORDER — LIDOCAINE HCL 1 % IJ SOLN
INTRAMUSCULAR | Status: DC | PRN
  Administered 2017-10-21: 30 mL

## 2017-10-21 MED ORDER — ROCURONIUM BROMIDE 50 MG/5ML IV SOLN
INTRAVENOUS | Status: AC
  Filled 2017-10-21: qty 1

## 2017-10-21 MED ORDER — LIDOCAINE HCL (CARDIAC) 20 MG/ML IV SOLN
INTRAVENOUS | Status: DC | PRN
  Administered 2017-10-21: 80 mg via INTRAVENOUS

## 2017-10-21 MED ORDER — KCL IN DEXTROSE-NACL 20-5-0.45 MEQ/L-%-% IV SOLN
INTRAVENOUS | Status: DC
  Administered 2017-10-21 – 2017-10-22 (×2): via INTRAVENOUS
  Filled 2017-10-21 (×4): qty 1000

## 2017-10-21 MED ORDER — ACETAMINOPHEN 325 MG PO TABS: 650.0000 mg | ORAL_TABLET | Freq: Four times a day (QID) | ORAL | Status: DC | PRN

## 2017-10-21 MED ORDER — PROMETHAZINE HCL 25 MG/ML IJ SOLN: 6.2500 mg | INTRAMUSCULAR | Status: DC | PRN

## 2017-10-21 MED ORDER — EPHEDRINE SULFATE 50 MG/ML IJ SOLN
INTRAMUSCULAR | Status: DC | PRN
  Administered 2017-10-21 (×2): 10 mg via INTRAVENOUS

## 2017-10-21 MED ORDER — ROCURONIUM BROMIDE 100 MG/10ML IV SOLN
INTRAVENOUS | Status: DC | PRN
  Administered 2017-10-21: 20 mg via INTRAVENOUS
  Administered 2017-10-21: 50 mg via INTRAVENOUS

## 2017-10-21 MED ORDER — FENTANYL CITRATE (PF) 100 MCG/2ML IJ SOLN
INTRAMUSCULAR | Status: AC
  Filled 2017-10-21: qty 2

## 2017-10-21 MED ORDER — ONDANSETRON HCL 4 MG/2ML IJ SOLN
INTRAMUSCULAR | Status: DC | PRN
  Administered 2017-10-21: 4 mg via INTRAVENOUS

## 2017-10-21 MED ORDER — ACETAMINOPHEN 10 MG/ML IV SOLN
INTRAVENOUS | Status: DC | PRN
  Administered 2017-10-21: 1000 mg via INTRAVENOUS

## 2017-10-21 MED ORDER — LIDOCAINE HCL (PF) 2 % IJ SOLN
INTRAMUSCULAR | Status: AC
Start: 2017-10-21 — End: 2017-10-21
  Filled 2017-10-21: qty 10

## 2017-10-21 MED ORDER — HYDROMORPHONE HCL 1 MG/ML IJ SOLN: 0.2500 mg | INTRAMUSCULAR | Status: DC | PRN

## 2017-10-21 MED ORDER — DEXAMETHASONE SODIUM PHOSPHATE 10 MG/ML IJ SOLN
INTRAMUSCULAR | Status: AC
  Filled 2017-10-21: qty 1

## 2017-10-21 MED ORDER — MIDAZOLAM HCL 2 MG/2ML IJ SOLN
INTRAMUSCULAR | Status: AC
  Filled 2017-10-21: qty 2

## 2017-10-21 MED ORDER — KETOROLAC TROMETHAMINE 15 MG/ML IJ SOLN
15.0000 mg | Freq: Once | INTRAMUSCULAR | Status: AC
  Administered 2017-10-21: 15 mg via INTRAVENOUS
  Filled 2017-10-21: qty 1

## 2017-10-21 MED ORDER — KETOROLAC TROMETHAMINE 15 MG/ML IJ SOLN
15.0000 mg | Freq: Four times a day (QID) | INTRAMUSCULAR | Status: DC
  Administered 2017-10-21 – 2017-10-22 (×4): 15 mg via INTRAVENOUS
  Filled 2017-10-21 (×4): qty 1

## 2017-10-21 MED ORDER — ACETAMINOPHEN 650 MG RE SUPP: 650.0000 mg | Freq: Four times a day (QID) | RECTAL | Status: DC | PRN

## 2017-10-21 MED ORDER — SUGAMMADEX SODIUM 200 MG/2ML IV SOLN
INTRAVENOUS | Status: DC | PRN
  Administered 2017-10-21: 200 mg via INTRAVENOUS

## 2017-10-21 MED ORDER — OXYCODONE HCL 5 MG PO TABS: 5.0000 mg | ORAL_TABLET | Freq: Once | ORAL | Status: DC | PRN

## 2017-10-21 MED ORDER — FENTANYL CITRATE (PF) 100 MCG/2ML IJ SOLN
25.0000 ug | INTRAMUSCULAR | Status: DC | PRN
  Administered 2017-10-21 (×2): 50 ug via INTRAVENOUS

## 2017-10-21 MED ORDER — PROPOFOL 10 MG/ML IV BOLUS
INTRAVENOUS | Status: AC
  Filled 2017-10-21: qty 20

## 2017-10-21 MED ORDER — ONDANSETRON HCL 4 MG/2ML IJ SOLN
INTRAMUSCULAR | Status: AC
Start: 2017-10-21 — End: 2017-10-21
  Filled 2017-10-21: qty 2

## 2017-10-21 MED ORDER — PROPOFOL 10 MG/ML IV BOLUS
INTRAVENOUS | Status: DC | PRN
  Administered 2017-10-21: 130 mg via INTRAVENOUS

## 2017-10-21 MED ORDER — MIDAZOLAM HCL 2 MG/2ML IJ SOLN
INTRAMUSCULAR | Status: DC | PRN
  Administered 2017-10-21: 2 mg via INTRAVENOUS

## 2017-10-21 MED ORDER — FENTANYL CITRATE (PF) 100 MCG/2ML IJ SOLN
INTRAMUSCULAR | Status: DC | PRN
  Administered 2017-10-21 (×2): 50 ug via INTRAVENOUS

## 2017-10-21 MED ORDER — SUGAMMADEX SODIUM 200 MG/2ML IV SOLN
INTRAVENOUS | Status: AC
  Filled 2017-10-21: qty 2

## 2017-10-21 SURGICAL SUPPLY — 34 items
APPLIER CLIP ROT 10 11.4 M/L (STAPLE) ×2
CHLORAPREP W/TINT 26ML (MISCELLANEOUS) ×2 IMPLANT
CLIP APPLIE ROT 10 11.4 M/L (STAPLE) ×1 IMPLANT
DECANTER SPIKE VIAL GLASS SM (MISCELLANEOUS) IMPLANT
DERMABOND ADVANCED (GAUZE/BANDAGES/DRESSINGS) ×1
DERMABOND ADVANCED .7 DNX12 (GAUZE/BANDAGES/DRESSINGS) ×1 IMPLANT
DEVICE PMI PUNCTURE CLOSURE (MISCELLANEOUS) ×2 IMPLANT
DRESSING SURGICEL FIBRLLR 1X2 (HEMOSTASIS) IMPLANT
DRSG SURGICEL FIBRILLAR 1X2 (HEMOSTASIS)
ELECT REM PT RETURN 9FT ADLT (ELECTROSURGICAL) ×2
ELECTRODE REM PT RTRN 9FT ADLT (ELECTROSURGICAL) ×1 IMPLANT
GLOVE BIO SURGEON STRL SZ7 (GLOVE) ×6 IMPLANT
GLOVE BIOGEL PI IND STRL 7.5 (GLOVE) ×1 IMPLANT
GLOVE BIOGEL PI INDICATOR 7.5 (GLOVE) ×1
GOWN STRL REUS W/ TWL LRG LVL3 (GOWN DISPOSABLE) ×2 IMPLANT
GOWN STRL REUS W/TWL LRG LVL3 (GOWN DISPOSABLE) ×2
IRRIGATION STRYKERFLOW (MISCELLANEOUS) ×1 IMPLANT
IRRIGATOR STRYKERFLOW (MISCELLANEOUS) ×2
IV NS 1000ML (IV SOLUTION) ×1
IV NS 1000ML BAXH (IV SOLUTION) ×1 IMPLANT
KIT RM TURNOVER STRD PROC AR (KITS) ×2 IMPLANT
NEEDLE HYPO 22GX1.5 SAFETY (NEEDLE) ×2 IMPLANT
NEEDLE INSUFFLATION 14GA 120MM (NEEDLE) ×2 IMPLANT
NS IRRIG 1000ML POUR BTL (IV SOLUTION) ×2 IMPLANT
PACK LAP CHOLECYSTECTOMY (MISCELLANEOUS) ×2 IMPLANT
POUCH SPECIMEN RETRIEVAL 10MM (ENDOMECHANICALS) ×2 IMPLANT
SCISSORS METZENBAUM CVD 33 (INSTRUMENTS) IMPLANT
SLEEVE ENDOPATH XCEL 5M (ENDOMECHANICALS) ×6 IMPLANT
SUT MNCRL AB 4-0 PS2 18 (SUTURE) ×2 IMPLANT
SUT VICRYL 0 UR6 27IN ABS (SUTURE) ×2 IMPLANT
SUT VICRYL AB 3-0 FS1 BRD 27IN (SUTURE) ×2 IMPLANT
TROCAR XCEL NON-BLD 11X100MML (ENDOMECHANICALS) ×2 IMPLANT
TROCAR XCEL NON-BLD 5MMX100MML (ENDOMECHANICALS) ×2 IMPLANT
TUBING INSUFFLATION (TUBING) ×2 IMPLANT

## 2017-10-21 NOTE — Progress Notes (Signed)
[facility] consult with patient for an order requisition for Advance Directive. Patient was alert and sitting up in bed. Patient stated that she did want an advance directive and asked that I bring one for her husband to complete as well. Patient requested that forms be completed after her surgery this afternoon and would like a follow-up tomorrow. Patient is interested in donating her organs and body to science and asked for information on how to donate body to science. Patient has five grown children and has a strong support system. Patient was in a good mood during visit and ready for her procedure. [facility] will place not for [facility] on-call to follow-up tomorrow with patient's requested paperwork.   10/21/17 0800  Clinical Encounter Type  Visited With Patient;Health care provider  Visit Type Initial;Spiritual support  Referral From Nurse;Physician  Spiritual Encounters  Spiritual Needs Literature;Emotional

## 2017-10-21 NOTE — Anesthesia Post-op Follow-up Note (Signed)
Anesthesia QCDR form completed.        

## 2017-10-21 NOTE — Consult Note (Signed)
[facility] [place] NAME: Catherine Hughes    MR#:  [id number]  DATE OF BIRTH:  1951-06-11  DATE OF ADMISSION:  10/20/2017  PRIMARY CARE PHYSICIAN: Guadalupe Maple, MD   REQUESTING/REFERRING PHYSICIAN: Rosana Hoes  CHIEF COMPLAINT:   Chief Complaint  Patient presents with  . Abdominal Pain    HISTORY OF PRESENT ILLNESS: Catherine Hughes  is a 67 y.o. female with a known history of anxiety, depression, fibromyalgia- had epigastric and upper abdominal with some chest pain for last one week which was constant and associated with nausea and also vomited 1-2 times. Did not had any fever or chills. Came to emergency room for that and noted to have acute cholecystitis. Admitted to surgical services. Her troponin was noted slightly elevated on admission so medical consult was called in. At baseline patient is not doing any exercise but she is very active in her home and taking care of her 64-year-old grandchild. She has one level house but whenever she goes to friends and families she never had any trouble climbing stairs. She also denies any difficulty including shortness of breath or chest pain while walking long distance in the stores and parking lots like [employer]. Currently no complaint except some upper abdominal pain.  PAST MEDICAL HISTORY:   Past Medical History:  Diagnosis Date  . Anxiety   . Arthritis   . Bipolar 1 disorder ([place])   . Depression   . Fibromyalgia   . Glaucoma   . Menopausal state   . Obesity (BMI 30.0-34.9)   . Pneumonia     PAST SURGICAL HISTORY:  Past Surgical History:  Procedure Laterality Date  . APPENDECTOMY     Open appendectomy (as a child)  . BREAST SURGERY Bilateral implants and removal  . COLONOSCOPY WITH PROPOFOL N/A 07/25/2017   Procedure: COLONOSCOPY WITH PROPOFOL;  Surgeon: Jonathon Bellows, MD;  Location: [facility] ENDOSCOPY;  Service: Gastroenterology;  Laterality: N/A;  . FRACTURE SURGERY Right 2014   ankle  . SPINE  SURGERY  L4-5   fusion and coil surgery (anterior approach)    SOCIAL HISTORY:  Social History   Tobacco Use  . Smoking status: Former Smoker    Last attempt to quit: 03/18/1989    Years since quitting: 28.6  . Smokeless tobacco: Never Used  Substance Use Topics  . Alcohol use: Yes    Alcohol/week: 0.0 oz    Comment: rarely    FAMILY HISTORY:  Family History  Problem Relation Age of Onset  . Cancer Mother        colon  . Diabetes Mother   . Coronary artery disease Mother   . Parkinson's disease Father   . Kidney disease Neg Hx     DRUG ALLERGIES:  Allergies  Allergen Reactions  . Tape Rash  . Iodine Other (See Comments)    Pt states "causes blisters"   . Morphine And Related     Morphine Contractions  . Terfenadine Palpitations    REVIEW OF SYSTEMS:   CONSTITUTIONAL: No fever, fatigue or weakness.  EYES: No blurred or double vision.  EARS, NOSE, AND THROAT: No tinnitus or ear pain.  RESPIRATORY: No cough, shortness of breath, wheezing or hemoptysis.  CARDIOVASCULAR: No chest pain, orthopnea, edema.  GASTROINTESTINAL: No nausea, vomiting, diarrhea , had upper abdominal pain.  GENITOURINARY: No dysuria, hematuria.  ENDOCRINE: No polyuria, nocturia,  HEMATOLOGY: No anemia, easy bruising or bleeding SKIN: No rash or lesion. MUSCULOSKELETAL: No joint pain or  arthritis.   NEUROLOGIC: No tingling, numbness, weakness.  PSYCHIATRY: No anxiety or depression.   MEDICATIONS AT HOME:  Prior to Admission medications   Medication Sig Start Date End Date Taking? Authorizing Provider  acidophilus (RISAQUAD) CAPS capsule Take 1 capsule by mouth daily.   Yes [provider]  acyclovir ointment (ZOVIRAX) 5 % Apply 1 application topically every 3 (three) hours. 07/06/17  Yes Volney American, PA-C  ALPRAZolam Duanne Moron) 1 MG tablet Take 1 mg by mouth 3 (three) times daily as needed for anxiety.   Yes [provider]  Cholecalciferol (VITAMIN D3) 2000 units  capsule Take 1 capsule (2,000 Units total) by mouth daily. 03/10/16  Yes Milinda Pointer, MD  cyclobenzaprine (FLEXERIL) 5 MG tablet Take 5 mg by mouth daily as needed. 09/04/17  Yes [provider]  diazepam (VALIUM) 5 MG tablet Take 1 tablet (5 mg total) every 12 (twelve) hours as needed by mouth for anxiety. 09/04/17  Yes Crissman, Jeannette How, MD  ibuprofen (ADVIL,MOTRIN) 600 MG tablet TK 1 T PO  Q 6 TO 8 H PRN P 07/10/17  Yes [provider]  Multiple Vitamin (MULTIVITAMIN) tablet Take 1 tablet by mouth daily.   Yes [provider]  pantoprazole (PROTONIX) 40 MG tablet Take 1 tablet (40 mg total) by mouth daily. 07/06/17  Yes Volney American, PA-C  temazepam (RESTORIL) 15 MG capsule Take 1 tablet nightly as needed for sleep 07/06/17  Yes Volney American, PA-C  clonazePAM (KLONOPIN) 1 MG tablet Take 1 tablet (1 mg total) 2 (two) times daily as needed by mouth for anxiety. Patient not taking: Reported on 10/20/2017 09/04/17   Guadalupe Maple, MD      PHYSICAL EXAMINATION:   VITAL SIGNS: Blood pressure (!) 114/57, pulse 76, temperature 98.7 F (37.1 C), resp. rate 18, height 5\' 5"  (1.651 m), weight 73.9 kg (163 lb), SpO2 98 %.  GENERAL:  67 y.o.-year-old patient lying in the bed with no acute distress.  EYES: Pupils equal, round, reactive to light and accommodation. No scleral icterus. Extraocular muscles intact.  HEENT: Head atraumatic, normocephalic. Oropharynx and nasopharynx clear.  NECK:  Supple, no jugular venous distention. No thyroid enlargement, no tenderness.  LUNGS: Normal breath sounds bilaterally, no wheezing, rales,rhonchi or crepitation. No use of accessory muscles of respiration.  CARDIOVASCULAR: S1, S2 normal. No murmurs, rubs, or gallops.  ABDOMEN: Soft, nontender, nondistended. Bowel sounds present. No organomegaly or mass.  EXTREMITIES: No pedal edema, cyanosis, or clubbing.  NEUROLOGIC: Cranial nerves II through XII are intact. Muscle  strength 5/5 in all extremities. Sensation intact. Gait not checked.  PSYCHIATRIC: The patient is alert and oriented x 3.  SKIN: No obvious rash, lesion, or ulcer.   LABORATORY PANEL:   CBC Recent Labs  Lab 10/20/17 1339 10/21/17 0559  WBC 7.3 6.4  HGB 13.2 11.5*  HCT 39.7 34.5*  PLT 353 284  MCV 92.6 92.6  MCH 30.8 30.9  MCHC 33.2 33.4  RDW 13.0 12.7   ------------------------------------------------------------------------------------------------------------------  Chemistries  Recent Labs  Lab 10/20/17 1339 10/21/17 0559  NA 138 140  K 4.0 4.2  CL 102 107  CO2 26 27  GLUCOSE 108* 123*  BUN 13 12  CREATININE 0.70 0.95  CALCIUM 9.6 9.1  AST 18 17  ALT 17 12*  ALKPHOS 88 74  BILITOT 0.8 0.9   ------------------------------------------------------------------------------------------------------------------ estimated creatinine clearance is 57.1 mL/min (by C-G formula based on SCr of 0.95 mg/dL). ------------------------------------------------------------------------------------------------------------------ No results for input(s): TSH, T4TOTAL,  T3FREE, THYROIDAB in the last 72 hours.  Invalid input(s): FREET3   Coagulation profile No results for input(s): INR, PROTIME in the last 168 hours. ------------------------------------------------------------------------------------------------------------------- No results for input(s): DDIMER in the last 72 hours. -------------------------------------------------------------------------------------------------------------------  Cardiac Enzymes Recent Labs  Lab 10/20/17 1607 10/21/17 0559 10/21/17 0907  TROPONINI 0.08* 0.06* 0.06*   ------------------------------------------------------------------------------------------------------------------ Invalid input(s): POCBNP  ---------------------------------------------------------------------------------------------------------------  Urinalysis     Component Value Date/Time   COLORURINE STRAW (A) 10/20/2017 1530   APPEARANCEUR CLEAR (A) 10/20/2017 1530   APPEARANCEUR Clear 12/14/2016 1552   LABSPEC 1.002 (L) 10/20/2017 1530   LABSPEC 1.014 01/16/2012 1436   PHURINE 6.0 10/20/2017 1530   GLUCOSEU NEGATIVE 10/20/2017 1530   GLUCOSEU Negative 01/16/2012 1436   HGBUR NEGATIVE 10/20/2017 1530   BILIRUBINUR NEGATIVE 10/20/2017 1530   BILIRUBINUR Negative 12/14/2016 1552   BILIRUBINUR Negative 01/16/2012 1436   KETONESUR NEGATIVE 10/20/2017 1530   PROTEINUR NEGATIVE 10/20/2017 1530   NITRITE NEGATIVE 10/20/2017 1530   LEUKOCYTESUR NEGATIVE 10/20/2017 1530   LEUKOCYTESUR Negative 12/14/2016 1552   LEUKOCYTESUR Negative 01/16/2012 1436     RADIOLOGY: Ct Angio Chest Pe W And/or Wo Contrast  Result Date: 10/20/2017 CLINICAL DATA:  Nausea and vomiting.  Abdominal pain. EXAM: CT ANGIOGRAPHY CHEST CT ABDOMEN AND PELVIS WITH CONTRAST TECHNIQUE: Multidetector CT imaging of the chest was performed using the standard protocol during bolus administration of intravenous contrast. Multiplanar CT image reconstructions and MIPs were obtained to evaluate the vascular anatomy. Multidetector CT imaging of the abdomen and pelvis was performed using the standard protocol during bolus administration of intravenous contrast. CONTRAST:  145mL ISOVUE-370 IOPAMIDOL (ISOVUE-370) INJECTION 76% COMPARISON:  None. FINDINGS: CTA CHEST FINDINGS Cardiovascular: Normal heart size. No pericardial effusion. Mild atheromatous changes to the aorta. There is no convincing acute pulmonary embolism, an apparent filling defect within right upper lobe branch on 7:108 is most likely streak artifact from neighboring intravenous contrast, which also affects other right upper lobe branches. Mediastinum/Nodes: Negative for adenopathy or mass. Nodal calcification of the left hilum. Lungs/Pleura: There is no edema, consolidation, effusion, or pneumothorax. Calcified granuloma in the left  lung. Musculoskeletal: No acute or aggressive finding Review of the MIP images confirms the above findings. CT ABDOMEN and PELVIS FINDINGS Hepatobiliary: Subcentimeter cystic density in the central liver.The gallbladder is enlarged and contains high-density calculi, including at the neck. The wall is diffusely thickened and mucosa is prominently enhancing. The neighboring fat is not particularly inflamed. Mild intrahepatic and extrahepatic bile duct prominence without visible calcified stone. Common bile duct measures 9 mm. Pancreas: Unremarkable. Spleen: Unremarkable. Adrenals/Urinary Tract: Negative adrenals. No hydronephrosis or stone. Bilateral renal low-density lesions that are too small for accurate densitometry. A left lower pole presumed cyst contains a tiny calcification. Unremarkable bladder. Stomach/Bowel:  No obstruction. Colonic diverticulosis. Vascular/Lymphatic: No acute vascular abnormality. Mild atherosclerotic calcification of the aorta. No mass or adenopathy. Reproductive:No pathologic findings. 3 cm left renal cyst with benign appearing mural calcification along the lower wall. Other: Small volume pelvic fluid, likely reactive. There are 3 cystic densities within the right lower quadrant fat that do not have a perceptible wall or septation and no surrounding inflammation. These measure up to 32 mm. Musculoskeletal: No acute abnormalities.  L4-5 and L5-S1 ALIF. Review of the MIP images confirms the above findings. IMPRESSION: 1. Cholecystitis and cholelithiasis. 2. Clustered cystic densities in the right lower quadrant are near the diseased gallbladder but are likely unrelated as there is no superimposed inflammation. These may be postoperative inclusion cysts/seromas given  right lower quadrant abdominal wall sutures. Please correlate with surgical history. If patient is treated with surgery suggest inspecting this area. 3. 3 cm left ovarian cyst with small mural calcification. Recommend nonurgent  pelvic ultrasound in this postmenopausal patient. 4. No definite acute pulmonary embolism. A tiny apparent defect within a single subsegmental right upper lobe branch is favored artifactual. Electronically Signed   By: Monte Fantasia M.D.   On: 10/20/2017 16:26   Ct Abdomen Pelvis W Contrast  Result Date: 10/20/2017 CLINICAL DATA:  Nausea and vomiting.  Abdominal pain. EXAM: CT ANGIOGRAPHY CHEST CT ABDOMEN AND PELVIS WITH CONTRAST TECHNIQUE: Multidetector CT imaging of the chest was performed using the standard protocol during bolus administration of intravenous contrast. Multiplanar CT image reconstructions and MIPs were obtained to evaluate the vascular anatomy. Multidetector CT imaging of the abdomen and pelvis was performed using the standard protocol during bolus administration of intravenous contrast. CONTRAST:  126mL ISOVUE-370 IOPAMIDOL (ISOVUE-370) INJECTION 76% COMPARISON:  None. FINDINGS: CTA CHEST FINDINGS Cardiovascular: Normal heart size. No pericardial effusion. Mild atheromatous changes to the aorta. There is no convincing acute pulmonary embolism, an apparent filling defect within right upper lobe branch on 7:108 is most likely streak artifact from neighboring intravenous contrast, which also affects other right upper lobe branches. Mediastinum/Nodes: Negative for adenopathy or mass. Nodal calcification of the left hilum. Lungs/Pleura: There is no edema, consolidation, effusion, or pneumothorax. Calcified granuloma in the left lung. Musculoskeletal: No acute or aggressive finding Review of the MIP images confirms the above findings. CT ABDOMEN and PELVIS FINDINGS Hepatobiliary: Subcentimeter cystic density in the central liver.The gallbladder is enlarged and contains high-density calculi, including at the neck. The wall is diffusely thickened and mucosa is prominently enhancing. The neighboring fat is not particularly inflamed. Mild intrahepatic and extrahepatic bile duct prominence without  visible calcified stone. Common bile duct measures 9 mm. Pancreas: Unremarkable. Spleen: Unremarkable. Adrenals/Urinary Tract: Negative adrenals. No hydronephrosis or stone. Bilateral renal low-density lesions that are too small for accurate densitometry. A left lower pole presumed cyst contains a tiny calcification. Unremarkable bladder. Stomach/Bowel:  No obstruction. Colonic diverticulosis. Vascular/Lymphatic: No acute vascular abnormality. Mild atherosclerotic calcification of the aorta. No mass or adenopathy. Reproductive:No pathologic findings. 3 cm left renal cyst with benign appearing mural calcification along the lower wall. Other: Small volume pelvic fluid, likely reactive. There are 3 cystic densities within the right lower quadrant fat that do not have a perceptible wall or septation and no surrounding inflammation. These measure up to 32 mm. Musculoskeletal: No acute abnormalities.  L4-5 and L5-S1 ALIF. Review of the MIP images confirms the above findings. IMPRESSION: 1. Cholecystitis and cholelithiasis. 2. Clustered cystic densities in the right lower quadrant are near the diseased gallbladder but are likely unrelated as there is no superimposed inflammation. These may be postoperative inclusion cysts/seromas given right lower quadrant abdominal wall sutures. Please correlate with surgical history. If patient is treated with surgery suggest inspecting this area. 3. 3 cm left ovarian cyst with small mural calcification. Recommend nonurgent pelvic ultrasound in this postmenopausal patient. 4. No definite acute pulmonary embolism. A tiny apparent defect within a single subsegmental right upper lobe branch is favored artifactual. Electronically Signed   By: Monte Fantasia M.D.   On: 10/20/2017 16:26    EKG: Orders placed or performed during the hospital encounter of 10/20/17  . ED EKG  . ED EKG  . EKG 12-Lead  . EKG 12-Lead    IMPRESSION AND PLAN:  *  elevated troponin  Likely due to  infection   No further work ups needed , optimized for surgery, low cardiac risk. Please proceed with cholecystectomy if needed.   Also discussed with cardiologist.   May benefit from follow up in cardio clinic, non urgent.  * Acute cholecystitis   Manage as per primary team.  * History of anxiety   May continue her home Medications.  All the records are reviewed and case discussed with ED provider. Management plans discussed with the patient, family and they are in agreement.  CODE STATUS:    Code Status Orders  (From admission, onward)        Start     Ordered   10/20/17 1806  Full code  Continuous     10/20/17 1813    Code Status History    Date Active Date Inactive Code Status Order ID Comments User Context   This patient has a current code status but no historical code status.     As patient doesn't have any major medical issues, I will sign off for now and please reconsult if needed after surgery.  TOTAL TIME TAKING CARE OF THIS PATIENT: 45 minutes.    Vaughan Basta M.D on 10/21/2017   Between 7am to 6pm - Pager - [phone]  After 6pm go to [email or web address] - password [email or web address] [facility]  Office  [phone]  CC: Primary care physician; Guadalupe Maple, MD   Note: This dictation was prepared with Dragon dictation along with smaller phrase technology. Any transcriptional errors that result from this process are unintentional.

## 2017-10-21 NOTE — Progress Notes (Signed)
Patient wanted cake; encouraged to go slow and stop if feeling bloated or nauseated; voiced understanding; husband brought chocolate cake, patient ate the whole slice; now complaining of nausea; restressed need to go slow; voiced understanding. Barbaraann Faster, RN

## 2017-10-21 NOTE — Progress Notes (Signed)
Patient stated that she could not stand the blue gowns; requests cotton ones only; also states that she has a tape allergy and requests only paper tape; acknowledged/noted. Barbaraann Faster, RN; 12:13 AM 10/21/2017

## 2017-10-21 NOTE — Progress Notes (Signed)
Patient states that "..the Doctor told me that I could have the Dilaudid whenever I wanted it..my pain is bad..If I have trouble getting it, call the Doctor..."; Educated patient on pain medication orders and parameters; Dilaudid is for "breakthrough" pain, works quickly, but doesn't last; PO meds work slower, but last longer; Encouraged to use Dilaudid sparingly, to get pain lower and use PO meds to keep pain undercontrol; Patient also on every 6 hour Toradol; Patient and husband, Eddie Dibbles acknowledged; concur with plan of care for this shift. Barbaraann Faster, RN 8:08 PM 10/21/2017

## 2017-10-21 NOTE — Consult Note (Signed)
I have seen pt, reviewed history and labs.  She has fairly active lifestyle, taking care of 69 year old grandchild, One level house, but no problem in climbing stairs or walking long distance in parking lots  And store like [employer]. She did not have any SOB or chest pain in past.  She was a smoker for 20 yrs one pack per days, quit 25 yrs ago. Mother had CAD in her 61's with stent.  She does not have any cardiac work ups in our system or In Care everywhere.  Troponin is slightly high, but trending down.  EKG - NSR  Plan  * elevated troponin   Likely due to infection   No further work ups needed , optimized for surgery, low cardiac risk. Please proceed with cholecystectomy if needed.   Also discussed with cardiologist.   May benefit from follow up in cardio clinic, non urgent.  Full note to follow. Time spent 50 min,.

## 2017-10-21 NOTE — Op Note (Signed)
SURGICAL OPERATIVE REPORT   DATE OF PROCEDURE: 10/21/2017  ATTENDING Surgeon(s): Vickie Epley, MD  ANESTHESIA: GETA  PRE-OPERATIVE DIAGNOSIS: Acute on Chronic Cholecystitis (K81.2)  POST-OPERATIVE DIAGNOSIS: Acute on Chronic Cholecystitis (K81.2)  PROCEDURE(S): (cpt's: 93818, 29937) 1.) Lysis of adhesions 2.) Laparoscopic Cholecystectomy 3.) Biopsy of cystic pericecal tissue  INTRAOPERATIVE FINDINGS: Enormous 16 cm (long) x 6 cm (wide) gallbladder with hydrops, extending from gallbladder fossa of liver to abdominal Right lower quadrant, adjacent to patient's former appendectomy site with cystic-appearing peri-cecal tissue  INTRAOPERATIVE FLUIDS: 1000 mL crystalloid   ESTIMATED BLOOD LOSS: Minimal (<20 mL)   URINE OUTPUT: No foley  SPECIMENS: Gallbladder  IMPLANTS: None  DRAINS: None   COMPLICATIONS: None apparent   CONDITION AT COMPLETION: Hemodynamically stable and extubated  DISPOSITION: PACU   INDICATION(S) FOR PROCEDURE:  Patient is a 67 y.o. female who this admission presented with alternating Right-sided abdominal pain and headache/nausea x 2 weeks. CT suggested calculous cholecystitis with an enormous gallbladder, extending to patient's Right lower quadrant, adjacent to the site of her remote open appendectomy ~60 years ago (as a child). All risks, benefits, and alternatives to above elective procedures were discussed with the patient, who elected to proceed, and informed consent was accordingly obtained at that time.   DETAILS OF PROCEDURE:  Patient was brought to the operating suite and appropriately identified. General anesthesia was administered along with peri-operative prophylactic IV antibiotics, and endotracheal intubation was performed by anesthesiologist, along with NG/OG tube for gastric decompression. In supine position, operative site was prepped and draped in usual sterile fashion, and following a brief time out, initial 5 mm incision was made at  Palmer's point  2 - 3 cm inferior to the Left costal margin along the mid-clavicular line, through which a Verress needle was inserted and its proper position confirmed using aspiration and saline meniscus test.   Upon insufflation of the abdominal cavity with carbon dioxide to a well-tolerated pressure of 12-15 mmHg, 5 mm peri-umbilical port followed by laparoscope were inserted and used to inspect the abdominal cavity and its contents with no injuries from insertion of the first trochar noted. From this perspective, filmy peri-umbilical to the anterior abdominal wall were bluntly disrupted, enabling a second 5 mm incision to be made in a natural skin crease just above the umbilicus, through which a second 5 mm port was inserted. Additional filmy adhesions to the anterior abdominal wall were lysed, permitting visualization of patient's abdominal cavity, specifically including patient's enormous and moderately inflamed but very much tensely distended gallbladder.  Three additional trocars were inserted, one at the epigastric position (10 mm) and two along the Right costal margin (5 mm). The table was then placed in reverse Trendelenburg position with the Right side up. In order to permit grasping of the gallbladder and to facilitate later extraction of the gallbladder from the abdominal cavity, the gallbladder was needle decompressed, using continuous suction and revealing gallbladder hydrops. Filmy adhesions between the gallbladder and omentum/duodenum/transverse colon were lysed using combined blunt dissection and selective electrocautery. The apex/dome of the gallbladder was grasped with an atraumatic grasper passed through the lateral port and retracted apically over the liver. The infundibulum was also grasped and retracted, exposing Calot's triangle. The peritoneum overlying the gallbladder infundibulum was incised and dissected free of surrounding peritoneal attachments, revealing the anatomically normal  cystic duct and cystic artery, which were clipped twice on the patient side and once on the gallbladder specimen side close to the gallbladder.   The enormous gallbladder  was then dissected from its peritoneal attachments to the liver using electrocautery, and the gallbladder was placed into a large laparoscopic specimen bag and removed from the abdominal cavity via the epigastric port site, which needed to be enlarged at skin, fascia, and peritoneum to accommodate such a very large gallbladder with many large and moderately large stones despite suction decompression of the fluid within the gallbladder. Penetrating towel clamps were used to re-approximate the 10 mm epigastric port site. Hemostasis and secure placement of clips were confirmed, and intra-peritoneal cavity was inspected. Decision was made at this time to obtain a biopsy specimen of the cystic-appearing tissue adjacent to the cecum to assess for malignancy. PMI laparoscopic fascial closure device was then used to re-approximate fascia with multiple passes at the 10 mm epigastric port site, the last suture being tied as the abdominal cavity was desufflated.  All ports were then removed under direct visualization, and abdominal cavity was desuflated. All port sites were irrigated/cleaned, additional local anesthetic was injected at each incision, 3-0 Vicryl was used to re-approximate dermis at 10 mm port site(s), and subcuticular 4-0 Monocryl suture was used to re-approximate skin. Skin was then cleaned, dried, and sterile skin glue was applied. Patient was then safely able to be awakened, extubated, and transferred to PACU for post-operative monitoring and care.   I was present for all aspects of procedure, and there were no intra-operative complications apparent.

## 2017-10-21 NOTE — Progress Notes (Signed)
Patient complains about a headache that is not relieved by Dilaudid; NPO for surgery this am; Dr. Burt Knack notified; new order written. Barbaraann Faster, RN 4:29 AM 10/21/2017

## 2017-10-21 NOTE — Progress Notes (Signed)
Patient requested earplugs as the beeping was keeping her awake.  Earplugs supplied to patient; Barbaraann Faster, RN 1:54 AM 10/21/2017

## 2017-10-21 NOTE — Progress Notes (Signed)
Patient a difficult stick; IV team was consulted earlier for IV start; IV malpositioned; Supervisor called to start new IV site. Acknowledged. Barbaraann Faster, RN 6:05 AM 10/21/2017

## 2017-10-21 NOTE — Consult Note (Signed)
Cardiology Consultation:   Patient ID: Catherine Hughes; [id number]; 1951-09-15   Admit date: 10/20/2017 Date of Consult: 10/21/2017  Primary Care Provider: Guadalupe Maple, MD Primary Cardiologist: None   Patient Profile:   Catherine Hughes is a 67 y.o. female with a bipolar disorder, fibromyalgia, and obesity  who is being seen today for the evaluation of elevated troponin at the request of Dr. Tama High.  History of Present Illness:   Catherine Hughes reports abdominal pain, nausea and vomiting that began 1.5 weeks ago.  She ate some raw cabbage and developed severe abdominal pain and vomiting.  10 hours later she had emesis of the undigested CABG.  Since then she has continued to have discomfort whenever she tries to eat.  The symptoms became progressively worse and she presented to the emergency department 10/20/16 where she was diagnosed with cholecystitis.  She was started on Zosyn with plans for cholecystectomy today.  Cardiology was consulted because troponin was elevated to 0.09. Catherine Hughes has experienced some chest discomfort during this time.  However, the chest pain only occurs after vomiting.  In general she does not get any formal exercise but is very active.  She is able to ambulate 1 flight of stairs or 4 blocks without any chest pain or shortness of breath.  She has not experienced any lower extremity edema, orthopnea, PND, palpitations, lightheadedness, or dizziness.  EKG has been unremarkable.  She has a remote smoking history and a family history of adult onset CAD but no other cardiac risk factors.  During this time.  She was also attempting to wean from Xanax.  She initially attributed her nausea and vomiting to this medication change.  She had a CT of the chest and abdomen that was negative for pulmonary embolism.  It also did not note any coronary calcifications.  She was incidentally noted to have a left ovarian cyst in addition to acute cholecystitis.   Past Medical History:    Diagnosis Date  . Anxiety   . Arthritis   . Bipolar 1 disorder ([place])   . Depression   . Fibromyalgia   . Glaucoma   . Menopausal state   . Obesity (BMI 30.0-34.9)   . Pneumonia     Past Surgical History:  Procedure Laterality Date  . APPENDECTOMY     Open appendectomy (as a child)  . BREAST SURGERY Bilateral implants and removal  . COLONOSCOPY WITH PROPOFOL N/A 07/25/2017   Procedure: COLONOSCOPY WITH PROPOFOL;  Surgeon: Jonathon Bellows, MD;  Location: [facility] ENDOSCOPY;  Service: Gastroenterology;  Laterality: N/A;  . FRACTURE SURGERY Right 2014   ankle  . SPINE SURGERY  L4-5   fusion and coil surgery (anterior approach)     Home Medications:  Prior to Admission medications   Medication Sig Start Date End Date Taking? Authorizing Provider  acidophilus (RISAQUAD) CAPS capsule Take 1 capsule by mouth daily.   Yes [provider]  acyclovir ointment (ZOVIRAX) 5 % Apply 1 application topically every 3 (three) hours. 07/06/17  Yes Volney American, PA-C  ALPRAZolam Duanne Moron) 1 MG tablet Take 1 mg by mouth 3 (three) times daily as needed for anxiety.   Yes [provider]  Cholecalciferol (VITAMIN D3) 2000 units capsule Take 1 capsule (2,000 Units total) by mouth daily. 03/10/16  Yes Milinda Pointer, MD  cyclobenzaprine (FLEXERIL) 5 MG tablet Take 5 mg by mouth daily as needed. 09/04/17  Yes [provider]  diazepam (VALIUM) 5 MG tablet Take 1 tablet (  5 mg total) every 12 (twelve) hours as needed by mouth for anxiety. 09/04/17  Yes Crissman, Jeannette How, MD  ibuprofen (ADVIL,MOTRIN) 600 MG tablet TK 1 T PO  Q 6 TO 8 H PRN P 07/10/17  Yes [provider]  Multiple Vitamin (MULTIVITAMIN) tablet Take 1 tablet by mouth daily.   Yes [provider]  pantoprazole (PROTONIX) 40 MG tablet Take 1 tablet (40 mg total) by mouth daily. 07/06/17  Yes Volney American, PA-C  temazepam (RESTORIL) 15 MG capsule Take 1 tablet nightly as needed for sleep  07/06/17  Yes Volney American, PA-C  clonazePAM (KLONOPIN) 1 MG tablet Take 1 tablet (1 mg total) 2 (two) times daily as needed by mouth for anxiety. Patient not taking: Reported on 10/20/2017 09/04/17   Guadalupe Maple, MD    Inpatient Medications: Scheduled Meds: . acetaminophen  650 mg Oral Once  . [START ON 10/22/2017] enoxaparin (LOVENOX) injection  40 mg Subcutaneous Q24H  . pantoprazole (PROTONIX) IV  40 mg Intravenous QHS   Continuous Infusions: . dextrose 5% lactated ringers 100 mL/hr at 10/20/17 2026  . piperacillin-tazobactam (ZOSYN)  IV 3.375 g (10/21/17 0818)   PRN Meds: HYDROmorphone (DILAUDID) injection, ondansetron **OR** ondansetron (ZOFRAN) IV  Allergies:    Allergies  Allergen Reactions  . Tape Rash  . Morphine And Related     Morphine Contractions  . Terfenadine Palpitations    Social History:   Social History   Socioeconomic History  . Marital status: Married    Spouse name: Not on file  . Number of children: Not on file  . Years of education: Not on file  . Highest education level: Not on file  Social Needs  . Financial resource strain: Not on file  . Food insecurity - worry: Not on file  . Food insecurity - inability: Not on file  . Transportation needs - medical: Not on file  . Transportation needs - non-medical: Not on file  Occupational History  . Not on file  Tobacco Use  . Smoking status: Former Smoker    Last attempt to quit: 03/18/1989    Years since quitting: 28.6  . Smokeless tobacco: Never Used  Substance and Sexual Activity  . Alcohol use: Yes    Alcohol/week: 0.0 oz    Comment: rarely  . Drug use: No  . Sexual activity: Not Currently  Other Topics Concern  . Not on file  Social History Narrative  . Not on file    Family History:    Family History  Problem Relation Age of Onset  . Cancer Mother        colon  . Diabetes Mother   . Coronary artery disease Mother   . Parkinson's disease Father   . Kidney disease Neg  Hx      ROS:  Please see the history of present illness.  Review of Systems  Constitution: Positive for chills, decreased appetite, fever, malaise/fatigue and weight loss.    All other ROS reviewed and negative.     Physical Exam/Data:   Vitals:   10/20/17 2014 10/20/17 2100 10/21/17 0407 10/21/17 0407  BP: 133/63  107/65   Pulse: (!) 56  71 65  Resp:  18 20   Temp: 98 F (36.7 C)  98.4 F (36.9 C)   TempSrc: Oral  Oral   SpO2: 100%  96% 97%  Weight:      Height:        Intake/Output Summary (Last 24 hours)  at 10/21/2017 0925 Last data filed at 10/21/2017 0819 Gross per 24 hour  Intake 656 ml  Output -  Net 656 ml   Filed Weights   10/20/17 1333  Weight: 163 lb (73.9 kg)   Body mass index is 27.12 kg/m.   VS:  BP 120/63 (BP Location: Right Arm)   Pulse 60   Temp 98.2 F (36.8 C)   Resp 17   Ht 5\' 5"  (1.651 m)   Wt 163 lb (73.9 kg)   SpO2 99%   BMI 27.12 kg/m   , BMI Body mass index is 27.12 kg/m. GENERAL:  Well appearing HEENT: Pupils equal round and reactive, fundi not visualized, oral mucosa unremarkable NECK:  No jugular venous distention, waveform within normal limits, carotid upstroke brisk and symmetric, no bruits, no thyromegaly LUNGS:  Clear to auscultation bilaterally HEART:  RRR.  PMI not displaced or sustained,S1 and S2 within normal limits, no S3, no S4, no clicks, no rubs, no murmurs ABD:  Flat, positive bowel sounds normal in frequency in pitch, no bruits, no rebound, +RUQ TTP and guarding.  No midline pulsatile mass, no hepatomegaly, no splenomegaly EXT:  2 plus pulses throughout, no edema, no cyanosis no clubbing SKIN:  No rashes no nodules NEURO:  Cranial nerves II through XII grossly intact, motor grossly intact throughout PSYCH:  Cognitively intact, oriented to person place and time   EKG:  The EKG was personally reviewed and demonstrates: Sinus rhythm.  Rate 63 bpm. Telemetry:  Telemetry was personally reviewed and demonstrates:   n/a  Relevant CV Studies: n/a  Laboratory Data:  Chemistry Recent Labs  Lab 10/20/17 1339 10/21/17 0559  NA 138 140  K 4.0 4.2  CL 102 107  CO2 26 27  GLUCOSE 108* 123*  BUN 13 12  CREATININE 0.70 0.95  CALCIUM 9.6 9.1  GFRNONAA >60 >60  GFRAA >60 >60  ANIONGAP 10 6    Recent Labs  Lab 10/20/17 1339 10/21/17 0559  PROT 7.8 6.9  ALBUMIN 4.0 3.4*  AST 18 17  ALT 17 12*  ALKPHOS 88 74  BILITOT 0.8 0.9   Hematology Recent Labs  Lab 10/20/17 1339 10/21/17 0559  WBC 7.3 6.4  RBC 4.29 3.73*  HGB 13.2 11.5*  HCT 39.7 34.5*  MCV 92.6 92.6  MCH 30.8 30.9  MCHC 33.2 33.4  RDW 13.0 12.7  PLT 353 284   Cardiac Enzymes Recent Labs  Lab 10/20/17 1339 10/20/17 1607  TROPONINI 0.09* 0.08*   No results for input(s): TROPIPOC in the last 168 hours.  BNPNo results for input(s): BNP, PROBNP in the last 168 hours.  DDimer No results for input(s): DDIMER in the last 168 hours.  Radiology/Studies:  Ct Angio Chest Pe W And/or Wo Contrast  Result Date: 10/20/2017 CLINICAL DATA:  Nausea and vomiting.  Abdominal pain. EXAM: CT ANGIOGRAPHY CHEST CT ABDOMEN AND PELVIS WITH CONTRAST TECHNIQUE: Multidetector CT imaging of the chest was performed using the standard protocol during bolus administration of intravenous contrast. Multiplanar CT image reconstructions and MIPs were obtained to evaluate the vascular anatomy. Multidetector CT imaging of the abdomen and pelvis was performed using the standard protocol during bolus administration of intravenous contrast. CONTRAST:  165mL ISOVUE-370 IOPAMIDOL (ISOVUE-370) INJECTION 76% COMPARISON:  None. FINDINGS: CTA CHEST FINDINGS Cardiovascular: Normal heart size. No pericardial effusion. Mild atheromatous changes to the aorta. There is no convincing acute pulmonary embolism, an apparent filling defect within right upper lobe branch on 7:108 is most likely streak artifact from  neighboring intravenous contrast, which also affects other right  upper lobe branches. Mediastinum/Nodes: Negative for adenopathy or mass. Nodal calcification of the left hilum. Lungs/Pleura: There is no edema, consolidation, effusion, or pneumothorax. Calcified granuloma in the left lung. Musculoskeletal: No acute or aggressive finding Review of the MIP images confirms the above findings. CT ABDOMEN and PELVIS FINDINGS Hepatobiliary: Subcentimeter cystic density in the central liver.The gallbladder is enlarged and contains high-density calculi, including at the neck. The wall is diffusely thickened and mucosa is prominently enhancing. The neighboring fat is not particularly inflamed. Mild intrahepatic and extrahepatic bile duct prominence without visible calcified stone. Common bile duct measures 9 mm. Pancreas: Unremarkable. Spleen: Unremarkable. Adrenals/Urinary Tract: Negative adrenals. No hydronephrosis or stone. Bilateral renal low-density lesions that are too small for accurate densitometry. A left lower pole presumed cyst contains a tiny calcification. Unremarkable bladder. Stomach/Bowel:  No obstruction. Colonic diverticulosis. Vascular/Lymphatic: No acute vascular abnormality. Mild atherosclerotic calcification of the aorta. No mass or adenopathy. Reproductive:No pathologic findings. 3 cm left renal cyst with benign appearing mural calcification along the lower wall. Other: Small volume pelvic fluid, likely reactive. There are 3 cystic densities within the right lower quadrant fat that do not have a perceptible wall or septation and no surrounding inflammation. These measure up to 32 mm. Musculoskeletal: No acute abnormalities.  L4-5 and L5-S1 ALIF. Review of the MIP images confirms the above findings. IMPRESSION: 1. Cholecystitis and cholelithiasis. 2. Clustered cystic densities in the right lower quadrant are near the diseased gallbladder but are likely unrelated as there is no superimposed inflammation. These may be postoperative inclusion cysts/seromas given right  lower quadrant abdominal wall sutures. Please correlate with surgical history. If patient is treated with surgery suggest inspecting this area. 3. 3 cm left ovarian cyst with small mural calcification. Recommend nonurgent pelvic ultrasound in this postmenopausal patient. 4. No definite acute pulmonary embolism. A tiny apparent defect within a single subsegmental right upper lobe branch is favored artifactual. Electronically Signed   By: Monte Fantasia M.D.   On: 10/20/2017 16:26   Ct Abdomen Pelvis W Contrast  Result Date: 10/20/2017 CLINICAL DATA:  Nausea and vomiting.  Abdominal pain. EXAM: CT ANGIOGRAPHY CHEST CT ABDOMEN AND PELVIS WITH CONTRAST TECHNIQUE: Multidetector CT imaging of the chest was performed using the standard protocol during bolus administration of intravenous contrast. Multiplanar CT image reconstructions and MIPs were obtained to evaluate the vascular anatomy. Multidetector CT imaging of the abdomen and pelvis was performed using the standard protocol during bolus administration of intravenous contrast. CONTRAST:  153mL ISOVUE-370 IOPAMIDOL (ISOVUE-370) INJECTION 76% COMPARISON:  None. FINDINGS: CTA CHEST FINDINGS Cardiovascular: Normal heart size. No pericardial effusion. Mild atheromatous changes to the aorta. There is no convincing acute pulmonary embolism, an apparent filling defect within right upper lobe branch on 7:108 is most likely streak artifact from neighboring intravenous contrast, which also affects other right upper lobe branches. Mediastinum/Nodes: Negative for adenopathy or mass. Nodal calcification of the left hilum. Lungs/Pleura: There is no edema, consolidation, effusion, or pneumothorax. Calcified granuloma in the left lung. Musculoskeletal: No acute or aggressive finding Review of the MIP images confirms the above findings. CT ABDOMEN and PELVIS FINDINGS Hepatobiliary: Subcentimeter cystic density in the central liver.The gallbladder is enlarged and contains  high-density calculi, including at the neck. The wall is diffusely thickened and mucosa is prominently enhancing. The neighboring fat is not particularly inflamed. Mild intrahepatic and extrahepatic bile duct prominence without visible calcified stone. Common bile duct measures 9 mm. Pancreas: Unremarkable. Spleen:  Unremarkable. Adrenals/Urinary Tract: Negative adrenals. No hydronephrosis or stone. Bilateral renal low-density lesions that are too small for accurate densitometry. A left lower pole presumed cyst contains a tiny calcification. Unremarkable bladder. Stomach/Bowel:  No obstruction. Colonic diverticulosis. Vascular/Lymphatic: No acute vascular abnormality. Mild atherosclerotic calcification of the aorta. No mass or adenopathy. Reproductive:No pathologic findings. 3 cm left renal cyst with benign appearing mural calcification along the lower wall. Other: Small volume pelvic fluid, likely reactive. There are 3 cystic densities within the right lower quadrant fat that do not have a perceptible wall or septation and no surrounding inflammation. These measure up to 32 mm. Musculoskeletal: No acute abnormalities.  L4-5 and L5-S1 ALIF. Review of the MIP images confirms the above findings. IMPRESSION: 1. Cholecystitis and cholelithiasis. 2. Clustered cystic densities in the right lower quadrant are near the diseased gallbladder but are likely unrelated as there is no superimposed inflammation. These may be postoperative inclusion cysts/seromas given right lower quadrant abdominal wall sutures. Please correlate with surgical history. If patient is treated with surgery suggest inspecting this area. 3. 3 cm left ovarian cyst with small mural calcification. Recommend nonurgent pelvic ultrasound in this postmenopausal patient. 4. No definite acute pulmonary embolism. A tiny apparent defect within a single subsegmental right upper lobe branch is favored artifactual. Electronically Signed   By: Monte Fantasia M.D.    On: 10/20/2017 16:26    Assessment and Plan:   # Presurgical risk assessment:  Troponin was very mildly elevated to a peak of 0.09 and is downtrending.  The only chest pain she has had has been in association with emesis.  EKG is unremarkable.  She is otherwise without chest pain or shortness of breath.  Although she  does not exercise much, she has no chest pain or shortness of breath with activities of daily living.  She is able to achieve more than 4 metabolic equivalents.  Therefore, no additional testing is indicated at this time.  She is at low risk to proceed with surgery, especially given her lack of cardiac risk factors and symptoms.  We will be available to help throughout her hospitalization.  It would be reasonable to have her follow-up with cardiology as an outpatient once she has recovered from her acute illness.  We will follow from a far for now.  Please feel free to call if there are questions.   For questions or updates, please contact [place] Please consult [email or web address] for contact info under Cardiology/STEMI.   Signed, Skeet Latch, MD  10/21/2017 9:25 AM

## 2017-10-21 NOTE — Transfer of Care (Signed)
Immediate Anesthesia Transfer of Care Note  Patient: Catherine Hughes  Procedure(s) Performed: Procedure(s): LAPAROSCOPIC CHOLECYSTECTOMY POSSIBLE OPEN (N/A)  Patient Location: PACU  Anesthesia Type:General  Level of Consciousness: sedated  Airway & Oxygen Therapy: Patient Spontanous Breathing and Patient connected to face mask oxygen  Post-op Assessment: Report given to RN and Post -op Vital signs reviewed and stable  Post vital signs: Reviewed and stable  Last Vitals:  Vitals:   10/21/17 1000 10/21/17 1349  BP: 120/63 140/74  Pulse: 60 (!) 103  Resp: 17 (!) 23  Temp: 36.8 C 37 C  SpO2: 85% 90%    Complications: No apparent anesthesia complications

## 2017-10-21 NOTE — Anesthesia Procedure Notes (Signed)
Procedure Name: Intubation Date/Time: 10/21/2017 10:49 AM Performed by: Doreen Salvage, CRNA Pre-anesthesia Checklist: Patient identified, Patient being monitored, Timeout performed, Emergency Drugs available and Suction available Patient Re-evaluated:Patient Re-evaluated prior to induction Oxygen Delivery Method: Circle system utilized Preoxygenation: Pre-oxygenation with 100% oxygen Induction Type: IV induction Ventilation: Mask ventilation without difficulty Laryngoscope Size: Mac and 3 Grade View: Grade III Tube type: Oral Tube size: 7.0 mm Number of attempts: 1 Airway Equipment and Method: Stylet Placement Confirmation: ETT inserted through vocal cords under direct vision,  positive ETCO2 and breath sounds checked- equal and bilateral Secured at: 21 cm Tube secured with: Tape Dental Injury: Teeth and Oropharynx as per pre-operative assessment  Difficulty Due To: Difficult Airway- due to dentition and Difficult Airway- due to anterior larynx

## 2017-10-21 NOTE — Progress Notes (Signed)
Dr. Burt Knack notified of: Patient stateing that she had been taking Tylenol for a week and it had done nothing for her pain or headache; requesting Toradol, states that it..."made everything go away..."  Acknowledged; new order written. Barbaraann Faster, RN 4:38 AM 10/21/2017

## 2017-10-21 NOTE — Discharge Instructions (Addendum)
In addition to included general post-operative instructions for Laparoscopic Cholecystectomy,  Diet: Resume home heart healthy diet (as discussed).   Activity: No heavy lifting >20 pounds (children, pets, laundry, garbage) or strenuous activity until follow-up, but light activity and walking are encouraged. Do not drive or drink alcohol if taking narcotic pain medications.  Wound care: 2 days after surgery (Monday, 1/7), may shower/get incision wet with soapy water and pat dry (do not rub incisions), but no baths or submerging incision underwater until follow-up.   Medications: Resume all home medications. For mild to moderate pain: acetaminophen (Tylenol) or ibuprofen/naproxen (if no kidney disease). Combining Tylenol with alcohol can substantially increase your risk of causing liver disease. Narcotic pain medications, if prescribed, can be used for severe pain, though may cause nausea, constipation, and drowsiness. Do not combine Tylenol and Percocet (or similar) within a 6 hour period as Percocet (and similar) contain(s) Tylenol. If you do not need the narcotic pain medication, you do not need to fill the prescription.  Call office [phone]) at any time if any questions, worsening pain, fevers/chills, bleeding, drainage from incision site, or other concerns.

## 2017-10-21 NOTE — Anesthesia Preprocedure Evaluation (Addendum)
Anesthesia Evaluation  Patient identified by MRN, date of birth, ID band Patient awake    Reviewed: Allergy & Precautions, H&P , NPO status , Patient's Chart, lab work & pertinent test results  History of Anesthesia Complications Negative for: history of anesthetic complications  Airway Mallampati: III  TM Distance: <3 FB Neck ROM: limited    Dental  (+) Chipped, Poor Dentition   Pulmonary neg shortness of breath, pneumonia, former smoker,           Cardiovascular Exercise Tolerance: Good (-) angina(-) Past MI and (-) DOE negative cardio ROS       Neuro/Psych  Headaches, PSYCHIATRIC DISORDERS Anxiety Depression Bipolar Disorder  Neuromuscular disease    GI/Hepatic negative GI ROS, Neg liver ROS, neg GERD  ,  Endo/Other  negative endocrine ROS  Renal/GU CRF     Musculoskeletal  (+) Arthritis , Fibromyalgia -  Abdominal   Peds  Hematology negative hematology ROS (+)   Anesthesia Other Findings Past Medical History: No date: Anxiety No date: Arthritis No date: Bipolar 1 disorder (HCC) No date: Depression No date: Fibromyalgia No date: Glaucoma No date: Menopausal state No date: Obesity (BMI 30.0-34.9) No date: Pneumonia  Past Surgical History: No date: APPENDECTOMY     Comment:  Open appendectomy (as a child) implants and removal: BREAST SURGERY; Bilateral 07/25/2017: COLONOSCOPY WITH PROPOFOL; N/A     Comment:  Procedure: COLONOSCOPY WITH PROPOFOL;  Surgeon: Jonathon Bellows, MD;  Location: [facility] ENDOSCOPY;  Service:               Gastroenterology;  Laterality: N/A; 2014: FRACTURE SURGERY; Right     Comment:  ankle L4-5: SPINE SURGERY     Comment:  fusion and coil surgery (anterior approach)  BMI    Body Mass Index:  27.12 kg/m      Reproductive/Obstetrics negative OB ROS                            Anesthesia Physical Anesthesia Plan  ASA: III  Anesthesia  Plan: General ETT   Post-op Pain Management:    Induction: Intravenous  PONV Risk Score and Plan: Ondansetron, Dexamethasone and Midazolam  Airway Management Planned: Oral ETT  Additional Equipment:   Intra-op Plan:   Post-operative Plan: Extubation in OR  Informed Consent: I have reviewed the patients History and Physical, chart, labs and discussed the procedure including the risks, benefits and alternatives for the proposed anesthesia with the patient or authorized representative who has indicated his/her understanding and acceptance.   Dental Advisory Given  Plan Discussed with: Anesthesiologist, CRNA and Surgeon  Anesthesia Plan Comments: (Patient presented with elevated troponin, which is since down trending, thought to be demand ischemia.  No cardiac history.  Normal EKG.  Vital signs stable.  Asymptomatic from a cardiac standpoint, no chest pain or pressure and no shortness of breath.  Cleared by medicine for surgery.  Per ACC/AHA guidelines plan to proceed to surgery with standard monitoring.  Patient consented for risks of anesthesia including but not limited to:  - adverse reactions to medications - damage to teeth, lips or other oral mucosa - sore throat or hoarseness - Damage to heart, brain, lungs or loss of life  Patient voiced understanding.)      Anesthesia Quick Evaluation

## 2017-10-22 ENCOUNTER — Other Ambulatory Visit: Payer: Self-pay

## 2017-10-22 ENCOUNTER — Observation Stay: Payer: Medicare Other

## 2017-10-22 DIAGNOSIS — R0789 Other chest pain: Secondary | ICD-10-CM | POA: Diagnosis not present

## 2017-10-22 DIAGNOSIS — R748 Abnormal levels of other serum enzymes: Secondary | ICD-10-CM | POA: Diagnosis not present

## 2017-10-22 DIAGNOSIS — K810 Acute cholecystitis: Secondary | ICD-10-CM | POA: Diagnosis not present

## 2017-10-22 DIAGNOSIS — R079 Chest pain, unspecified: Secondary | ICD-10-CM | POA: Diagnosis not present

## 2017-10-22 LAB — COMPREHENSIVE METABOLIC PANEL
ALT: 26 U/L (ref 14–54)
ANION GAP: 8 (ref 5–15)
AST: 41 U/L (ref 15–41)
Albumin: 3.3 g/dL — ABNORMAL LOW (ref 3.5–5.0)
Alkaline Phosphatase: 76 U/L (ref 38–126)
BUN: 10 mg/dL (ref 6–20)
CHLORIDE: 107 mmol/L (ref 101–111)
CO2: 25 mmol/L (ref 22–32)
CREATININE: 0.75 mg/dL (ref 0.44–1.00)
Calcium: 9 mg/dL (ref 8.9–10.3)
GFR calc non Af Amer: >60 mL/min (ref >60)
Glucose, Bld: 139 mg/dL — ABNORMAL HIGH (ref 65–99)
Potassium: 4 mmol/L (ref 3.5–5.1)
Sodium: 140 mmol/L (ref 135–145)
Total Bilirubin: 0.6 mg/dL (ref 0.3–1.2)
Total Protein: 6.9 g/dL (ref 6.5–8.1)

## 2017-10-22 LAB — TROPONIN I: TROPONIN I: 0.08 ng/mL — AB (ref <0.03)

## 2017-10-22 MED ORDER — TEMAZEPAM 15 MG PO CAPS
15.0000 mg | ORAL_CAPSULE | Freq: Every day | ORAL | Status: DC
  Administered 2017-10-22: 15 mg via ORAL
  Filled 2017-10-22: qty 1

## 2017-10-22 MED ORDER — ALPRAZOLAM 1 MG PO TABS
1.0000 mg | ORAL_TABLET | Freq: Three times a day (TID) | ORAL | Status: DC | PRN
  Administered 2017-10-22: 1 mg via ORAL
  Filled 2017-10-22: qty 1

## 2017-10-22 MED ORDER — ALPRAZOLAM 1 MG PO TABS
1.0000 mg | ORAL_TABLET | Freq: Every day | ORAL | Status: DC
  Administered 2017-10-22: 1 mg via ORAL
  Filled 2017-10-22: qty 1

## 2017-10-22 NOTE — Care Management Note (Signed)
Case Management Note  Patient Details  Name: Catherine Hughes MRN: [id number] Date of Birth: 1951-02-12  Subjective/Objective:       Anticipate discharge today at 7pm. Discharged late per c/o abdominal discomfort.              Action/Plan:   Expected Discharge Date:  10/22/17               Expected Discharge Plan:     In-House Referral:     Discharge planning Services     Post Acute Care Choice:    Choice offered to:     DME Arranged:    DME Agency:     HH Arranged:    HH Agency:     Status of Service:     If discussed at [employer], dates discussed:    Additional Comments:  Tanette Chauca A, RN 10/22/2017, 4:36 PM

## 2017-10-22 NOTE — Progress Notes (Signed)
10/22/2017  6:41 PM  Catherine Hughes to be D/C'd Home per MD order.  Discussed prescriptions and follow up appointments with the patient. Prescriptions given to patient, medication list explained in detail. Pt verbalized understanding.  Allergies as of 10/22/2017      Reactions   Tape Rash   Iodine Other (See Comments)   Pt states "causes blisters"    Morphine And Related    Morphine Contractions   Terfenadine Palpitations      Medication List    TAKE these medications   acidophilus Caps capsule Take 1 capsule by mouth daily.   acyclovir ointment 5 % Commonly known as:  ZOVIRAX Apply 1 application topically every 3 (three) hours.   ALPRAZolam 1 MG tablet Commonly known as:  XANAX Take 1 mg by mouth 3 (three) times daily as needed for anxiety.   clonazePAM 1 MG tablet Commonly known as:  KLONOPIN Take 1 tablet (1 mg total) 2 (two) times daily as needed by mouth for anxiety.   cyclobenzaprine 5 MG tablet Commonly known as:  FLEXERIL Take 5 mg by mouth daily as needed.   diazepam 5 MG tablet Commonly known as:  VALIUM Take 1 tablet (5 mg total) every 12 (twelve) hours as needed by mouth for anxiety.   ibuprofen 600 MG tablet Commonly known as:  ADVIL,MOTRIN TK 1 T PO  Q 6 TO 8 H PRN P   multivitamin tablet Take 1 tablet by mouth daily.   pantoprazole 40 MG tablet Commonly known as:  PROTONIX Take 1 tablet (40 mg total) by mouth daily.   temazepam 15 MG capsule Commonly known as:  RESTORIL Take 1 tablet nightly as needed for sleep   traMADol-acetaminophen 37.5-325 MG tablet Commonly known as:  ULTRACET Take 1-2 tablets by mouth every 4 (four) hours as needed for severe pain.   Vitamin D3 2000 units capsule Take 1 capsule (2,000 Units total) by mouth daily.       Vitals:   10/22/17 1207 10/22/17 1327  BP: (!) 195/89 (!) 130/53  Pulse: 65 66  Resp: 18   Temp: 98.2 F (36.8 C)   SpO2: 100%     Skin clean, dry and intact without evidence of skin break  down, no evidence of skin tears noted. IV catheter discontinued intact. Site without signs and symptoms of complications. Dressing and pressure applied. Pt denies pain at this time. No complaints noted.  An After Visit Summary was printed and given to the patient. Patient escorted via [place], and D/C home via private auto.  Dola Argyle

## 2017-10-22 NOTE — Progress Notes (Signed)
10/22/2017  11:30  Pt again in acute distress, crying and complaining of pain.  Adminiistered IV Toradol as scheduled and pain subsided.  Pt seemed more relaxed.  Will continue to monitor and assess.  Dola Argyle, RN

## 2017-10-22 NOTE — Progress Notes (Signed)
10/22/2017 0830  Pt in distress, crying out, describing 10/10 pain "in my chest."  Pt has no known cardiac history despite elevated troponin on admission.  Notified attending MD Rosana Hoes who recommended stat EKG and consult to hospitalist.  EKG was laregly normal, and attending MD ordered troponin drawn by lab.  Suggested this was possible gas pain and suggested pt ambulate.  Pt ambulated well with NT and seemed to calm down.  Hospitalist [place] stated pt may be discharged as long as troponin was okay.

## 2017-10-22 NOTE — Progress Notes (Signed)
"  The Dilaudid isn't really working for me..I think that I will ask the Doctor to give me Fentanyl". Barbaraann Faster, RN 7:17 AM 10/22/2017

## 2017-10-22 NOTE — Progress Notes (Signed)
10/22/2017  17:45  Came to patient's room to give discharge paperwork and facilitate discharge process.  Pt hesitant to go over discharge as she wanted to rest.  Insisted that we should go over paperwork now as perhaps she would rest better at home.  Did pt education and discharge instructions.  Pt hesitant to sign final page for discharge because "I'm not leaving now.  I'm leaving at 7:30 or 8."  Pt indicated she would feel safe going home only if she could stay until 8.  I assured pt that I could not or would not force her to leave, but suggested that she try to get home as soon as possible, as she seemed comfortable and had been deemed medically safe to leave by both her surgeon and hospitalist.  Dola Argyle, RN

## 2017-10-22 NOTE — Progress Notes (Signed)
Pharmacist Clare Gandy called to say that the Xanax and Restoril together with the pain meds was not a safe combination; relayed this to Mrs. Paddock; Xanax given; Barbaraann Faster, RN 2:15 AM 10/22/2017

## 2017-10-22 NOTE — Progress Notes (Signed)
Pharmacist, Clare Gandy called to hold Xanax until 0130 and if patient still wants it, to give it then. Acknowledged; Patient informed; Barbaraann Faster, RN 12:59 AM 10/22/2017

## 2017-10-22 NOTE — Progress Notes (Signed)
Dr. Burt Knack notified of patient's request to restart Restoril as at home; was ordered pre operatively, but not post operatively.  Acknowledged; new order written. Barbaraann Faster, RN 12:28 AM 10/22/2017

## 2017-10-22 NOTE — Progress Notes (Signed)
10/22/2017  1430  Called to pt's room to talk.  Pt expressed concern that "I feel the exact same as I did before surgery." and was hesitant to go home.  I explained that pain in the surgical area is to be expected.  Pt stated that "It was good that the gallbladder was removed, but I don't think that was the problem."  Attempted to comfort and reassure pt that her findings and experience are expected.  Notified attending MD Rosana Hoes who came to speak with pt.  As pt seemed insistent that something else was wrong in her chest, MD ordered chest x-ray.  Chest x-ray with no acute findings.  Dr.  Rosana Hoes again reassured pt that she was safe to go home.  Pt was agreeable.  Dola Argyle, RN

## 2017-10-22 NOTE — Progress Notes (Signed)
[facility] [place] NAME: Catherine Hughes    MR#:  [id number]  DATE OF BIRTH:  Jul 05, 1951  SUBJECTIVE:  CHIEF COMPLAINT:   Chief Complaint  Patient presents with  . Abdominal Pain    Status post cholecystectomy yesterday. The plan was for discharge today from surgical point. After eating her breakfast she had complain of chest tightness and epigastric pain. She felt almost "I'm going to die". So surgeon requested for follow-up from medical team. When I saw her, her pain is subsiding now. She described the pain mostly of gas type which is radiating from upper abdomen to the chest. No associated palpitation or shortness of breath.  REVIEW OF SYSTEMS:  CONSTITUTIONAL: No fever, fatigue or weakness.  EYES: No blurred or double vision.  EARS, NOSE, AND THROAT: No tinnitus or ear pain.  RESPIRATORY: No cough, shortness of breath, wheezing or hemoptysis.  CARDIOVASCULAR: Positive for chest pain, no orthopnea, edema.  GASTROINTESTINAL: No nausea, vomiting, diarrhea or abdominal pain.  GENITOURINARY: No dysuria, hematuria.  ENDOCRINE: No polyuria, nocturia,  HEMATOLOGY: No anemia, easy bruising or bleeding SKIN: No rash or lesion. MUSCULOSKELETAL: No joint pain or arthritis.   NEUROLOGIC: No tingling, numbness, weakness.  PSYCHIATRY: No anxiety or depression.   ROS  DRUG ALLERGIES:   Allergies  Allergen Reactions  . Tape Rash  . Iodine Other (See Comments)    Pt states "causes blisters"   . Morphine And Related     Morphine Contractions  . Terfenadine Palpitations    VITALS:  Blood pressure (!) 130/53, pulse 66, temperature 98.2 F (36.8 C), temperature source Oral, resp. rate 18, height 5\' 5"  (1.651 m), weight 73.9 kg (163 lb), SpO2 100 %.  PHYSICAL EXAMINATION:  GENERAL:  67 y.o.-year-old patient lying in the bed with no acute distress.  EYES: Pupils equal, round, reactive to light and accommodation. No scleral icterus. Extraocular  muscles intact.  HEENT: Head atraumatic, normocephalic. Oropharynx and nasopharynx clear.  NECK:  Supple, no jugular venous distention. No thyroid enlargement, no tenderness.  LUNGS: Normal breath sounds bilaterally, no wheezing, rales,rhonchi or crepitation. No use of accessory muscles of respiration.  CARDIOVASCULAR: S1, S2 normal. No murmurs, rubs, or gallops.  ABDOMEN: Soft, tender, nondistended. Bowel sounds present. No organomegaly or mass.  EXTREMITIES: No pedal edema, cyanosis, or clubbing.  NEUROLOGIC: Cranial nerves II through XII are intact. Muscle strength 5/5 in all extremities. Sensation intact. Gait not checked.  PSYCHIATRIC: The patient is alert and oriented x 3.  SKIN: No obvious rash, lesion, or ulcer.   Physical Exam LABORATORY PANEL:   CBC Recent Labs  Lab 10/21/17 0559  WBC 6.4  HGB 11.5*  HCT 34.5*  PLT 284   ------------------------------------------------------------------------------------------------------------------  Chemistries  Recent Labs  Lab 10/22/17 0436  NA 140  K 4.0  CL 107  CO2 25  GLUCOSE 139*  BUN 10  CREATININE 0.75  CALCIUM 9.0  AST 41  ALT 26  ALKPHOS 76  BILITOT 0.6   ------------------------------------------------------------------------------------------------------------------  Cardiac Enzymes Recent Labs  Lab 10/21/17 0907 10/22/17 1239  TROPONINI 0.06* 0.08*   ------------------------------------------------------------------------------------------------------------------  RADIOLOGY:  Ct Angio Chest Pe W And/or Wo Contrast  Result Date: 10/20/2017 CLINICAL DATA:  Nausea and vomiting.  Abdominal pain. EXAM: CT ANGIOGRAPHY CHEST CT ABDOMEN AND PELVIS WITH CONTRAST TECHNIQUE: Multidetector CT imaging of the chest was performed using the standard protocol during bolus administration of intravenous contrast. Multiplanar CT image reconstructions and MIPs were obtained to evaluate the  vascular anatomy. Multidetector CT  imaging of the abdomen and pelvis was performed using the standard protocol during bolus administration of intravenous contrast. CONTRAST:  174mL ISOVUE-370 IOPAMIDOL (ISOVUE-370) INJECTION 76% COMPARISON:  None. FINDINGS: CTA CHEST FINDINGS Cardiovascular: Normal heart size. No pericardial effusion. Mild atheromatous changes to the aorta. There is no convincing acute pulmonary embolism, an apparent filling defect within right upper lobe branch on 7:108 is most likely streak artifact from neighboring intravenous contrast, which also affects other right upper lobe branches. Mediastinum/Nodes: Negative for adenopathy or mass. Nodal calcification of the left hilum. Lungs/Pleura: There is no edema, consolidation, effusion, or pneumothorax. Calcified granuloma in the left lung. Musculoskeletal: No acute or aggressive finding Review of the MIP images confirms the above findings. CT ABDOMEN and PELVIS FINDINGS Hepatobiliary: Subcentimeter cystic density in the central liver.The gallbladder is enlarged and contains high-density calculi, including at the neck. The wall is diffusely thickened and mucosa is prominently enhancing. The neighboring fat is not particularly inflamed. Mild intrahepatic and extrahepatic bile duct prominence without visible calcified stone. Common bile duct measures 9 mm. Pancreas: Unremarkable. Spleen: Unremarkable. Adrenals/Urinary Tract: Negative adrenals. No hydronephrosis or stone. Bilateral renal low-density lesions that are too small for accurate densitometry. A left lower pole presumed cyst contains a tiny calcification. Unremarkable bladder. Stomach/Bowel:  No obstruction. Colonic diverticulosis. Vascular/Lymphatic: No acute vascular abnormality. Mild atherosclerotic calcification of the aorta. No mass or adenopathy. Reproductive:No pathologic findings. 3 cm left renal cyst with benign appearing mural calcification along the lower wall. Other: Small volume pelvic fluid, likely reactive. There  are 3 cystic densities within the right lower quadrant fat that do not have a perceptible wall or septation and no surrounding inflammation. These measure up to 32 mm. Musculoskeletal: No acute abnormalities.  L4-5 and L5-S1 ALIF. Review of the MIP images confirms the above findings. IMPRESSION: 1. Cholecystitis and cholelithiasis. 2. Clustered cystic densities in the right lower quadrant are near the diseased gallbladder but are likely unrelated as there is no superimposed inflammation. These may be postoperative inclusion cysts/seromas given right lower quadrant abdominal wall sutures. Please correlate with surgical history. If patient is treated with surgery suggest inspecting this area. 3. 3 cm left ovarian cyst with small mural calcification. Recommend nonurgent pelvic ultrasound in this postmenopausal patient. 4. No definite acute pulmonary embolism. A tiny apparent defect within a single subsegmental right upper lobe branch is favored artifactual. Electronically Signed   By: Monte Fantasia M.D.   On: 10/20/2017 16:26   Ct Abdomen Pelvis W Contrast  Result Date: 10/20/2017 CLINICAL DATA:  Nausea and vomiting.  Abdominal pain. EXAM: CT ANGIOGRAPHY CHEST CT ABDOMEN AND PELVIS WITH CONTRAST TECHNIQUE: Multidetector CT imaging of the chest was performed using the standard protocol during bolus administration of intravenous contrast. Multiplanar CT image reconstructions and MIPs were obtained to evaluate the vascular anatomy. Multidetector CT imaging of the abdomen and pelvis was performed using the standard protocol during bolus administration of intravenous contrast. CONTRAST:  192mL ISOVUE-370 IOPAMIDOL (ISOVUE-370) INJECTION 76% COMPARISON:  None. FINDINGS: CTA CHEST FINDINGS Cardiovascular: Normal heart size. No pericardial effusion. Mild atheromatous changes to the aorta. There is no convincing acute pulmonary embolism, an apparent filling defect within right upper lobe branch on 7:108 is most likely  streak artifact from neighboring intravenous contrast, which also affects other right upper lobe branches. Mediastinum/Nodes: Negative for adenopathy or mass. Nodal calcification of the left hilum. Lungs/Pleura: There is no edema, consolidation, effusion, or pneumothorax. Calcified granuloma in the left lung. Musculoskeletal: No  acute or aggressive finding Review of the MIP images confirms the above findings. CT ABDOMEN and PELVIS FINDINGS Hepatobiliary: Subcentimeter cystic density in the central liver.The gallbladder is enlarged and contains high-density calculi, including at the neck. The wall is diffusely thickened and mucosa is prominently enhancing. The neighboring fat is not particularly inflamed. Mild intrahepatic and extrahepatic bile duct prominence without visible calcified stone. Common bile duct measures 9 mm. Pancreas: Unremarkable. Spleen: Unremarkable. Adrenals/Urinary Tract: Negative adrenals. No hydronephrosis or stone. Bilateral renal low-density lesions that are too small for accurate densitometry. A left lower pole presumed cyst contains a tiny calcification. Unremarkable bladder. Stomach/Bowel:  No obstruction. Colonic diverticulosis. Vascular/Lymphatic: No acute vascular abnormality. Mild atherosclerotic calcification of the aorta. No mass or adenopathy. Reproductive:No pathologic findings. 3 cm left renal cyst with benign appearing mural calcification along the lower wall. Other: Small volume pelvic fluid, likely reactive. There are 3 cystic densities within the right lower quadrant fat that do not have a perceptible wall or septation and no surrounding inflammation. These measure up to 32 mm. Musculoskeletal: No acute abnormalities.  L4-5 and L5-S1 ALIF. Review of the MIP images confirms the above findings. IMPRESSION: 1. Cholecystitis and cholelithiasis. 2. Clustered cystic densities in the right lower quadrant are near the diseased gallbladder but are likely unrelated as there is no  superimposed inflammation. These may be postoperative inclusion cysts/seromas given right lower quadrant abdominal wall sutures. Please correlate with surgical history. If patient is treated with surgery suggest inspecting this area. 3. 3 cm left ovarian cyst with small mural calcification. Recommend nonurgent pelvic ultrasound in this postmenopausal patient. 4. No definite acute pulmonary embolism. A tiny apparent defect within a single subsegmental right upper lobe branch is favored artifactual. Electronically Signed   By: Monte Fantasia M.D.   On: 10/20/2017 16:26    ASSESSMENT AND PLAN:   Active Problems:   Preoperative evaluation of a medical condition to rule out surgical contraindications (TAR required)   Acute cholecystitis   Demand ischemia (HCC)   *  elevated troponin- chest pain.   Seen this morning, EKG is normal sinus rhythm without ST-T changes. Also followed one more troponin which is in same range as she had yesterday.  Pain is mostly related to her surgery and use of gas during laparoscopic procedure.  No further recommendations or workup for that and medically stable to be discharged. May benefit from follow up in cardio clinic, non urgent.  * Acute cholecystitis   Manage as per primary team.  * History of anxiety   May continue her home Medications.  * Hypertension   Blood pressure was very high during the episode today morning, most likely it was due to anxiety. Stopped IV fluid and after 2 hours on recheck, blood pressure is normal. No medications needed.    All the records are reviewed and case discussed with Care Management/Social Workerr. Management plans discussed with the patient, family and they are in agreement.  CODE STATUS: full.  TOTAL TIME TAKING CARE OF THIS PATIENT: 35 minutes.     POSSIBLE D/C IN 1-2 DAYS, DEPENDING ON CLINICAL CONDITION.   Vaughan Basta M.D on 10/22/2017   Between 7am to 6pm - Pager - [phone]  After 6pm  go to [email or web address] - password [email or web address] [facility]  Office  [phone]  CC: Primary care physician; Guadalupe Maple, MD  Note: This dictation was prepared with Dragon dictation along with smaller phrase technology. Any transcriptional errors that result from this process  are unintentional.

## 2017-10-22 NOTE — Progress Notes (Signed)
"  I'm only here to take Dilaudid.  I take my Restoril and Xanax together. I want my Dilaudid". Encouraged patient to space out meds. Acknowledged, but wants the Dilaudid "now". Barbaraann Faster, RN 2:28 AM 10/22/2017

## 2017-10-23 ENCOUNTER — Encounter: Payer: Self-pay | Admitting: Surgery

## 2017-10-23 ENCOUNTER — Ambulatory Visit: Payer: Self-pay | Admitting: Family Medicine

## 2017-10-23 ENCOUNTER — Telehealth: Payer: Self-pay

## 2017-10-23 NOTE — Anesthesia Postprocedure Evaluation (Signed)
Anesthesia Post Note  Patient: Catherine Hughes  Procedure(s) Performed: LAPAROSCOPIC CHOLECYSTECTOMY POSSIBLE OPEN (N/A )  Patient location during evaluation: PACU Anesthesia Type: General Level of consciousness: awake and alert Pain management: pain level controlled Vital Signs Assessment: post-procedure vital signs reviewed and stable Respiratory status: spontaneous breathing, nonlabored ventilation, respiratory function stable and patient connected to nasal cannula oxygen Cardiovascular status: blood pressure returned to baseline and stable Postop Assessment: no apparent nausea or vomiting Anesthetic complications: no     Last Vitals:  Vitals:   10/22/17 1207 10/22/17 1327  BP: (!) 195/89 (!) 130/53  Pulse: 65 66  Resp: 18   Temp: 36.8 C   SpO2: 100%     Last Pain:  Vitals:   10/22/17 1649  TempSrc:   PainSc: 4                  Precious Haws Piscitello

## 2017-10-23 NOTE — Telephone Encounter (Signed)
I have made the 1st attempt to contact the patient or family member in charge, in order to follow up from recently being discharged from the hospital. I left a message on voicemail but I will make another attempt at a different time.  

## 2017-10-24 LAB — SURGICAL PATHOLOGY

## 2017-10-25 ENCOUNTER — Telehealth: Payer: Self-pay | Admitting: Surgery

## 2017-10-25 ENCOUNTER — Encounter: Payer: Self-pay | Admitting: Family Medicine

## 2017-10-25 ENCOUNTER — Ambulatory Visit (INDEPENDENT_AMBULATORY_CARE_PROVIDER_SITE_OTHER): Payer: Medicare Other | Admitting: Family Medicine

## 2017-10-25 DIAGNOSIS — K810 Acute cholecystitis: Secondary | ICD-10-CM

## 2017-10-25 DIAGNOSIS — R079 Chest pain, unspecified: Secondary | ICD-10-CM | POA: Insufficient documentation

## 2017-10-25 DIAGNOSIS — F411 Generalized anxiety disorder: Secondary | ICD-10-CM

## 2017-10-25 MED ORDER — CYCLOBENZAPRINE HCL 5 MG PO TABS: 5.0000 mg | ORAL_TABLET | Freq: Three times a day (TID) | ORAL | 1 refills | Status: DC

## 2017-10-25 MED ORDER — ALPRAZOLAM 1 MG PO TABS: 1.0000 mg | ORAL_TABLET | Freq: Three times a day (TID) | ORAL | 1 refills | Status: DC | PRN

## 2017-10-25 NOTE — Assessment & Plan Note (Signed)
Reviewed anxiety will continueXanax and Flexeril with no change for now will continue changes in about a month and if a lot of resistance will consider psychiatric referral.

## 2017-10-25 NOTE — Telephone Encounter (Signed)
Called patient and had to leave her a voicemail to return our call.

## 2017-10-25 NOTE — Assessment & Plan Note (Signed)
Patient will discuss with her surgeon regarding  Pain care and treatment

## 2017-10-25 NOTE — Telephone Encounter (Signed)
Patient is calling said she had surgery on 10/21/17 she had a laparoscopic cholecystectomy done said she is in a lot of pain, pain level being at 10 sometimes a 12. Patient is asking for some pain medication. Please call patient and advise.

## 2017-10-25 NOTE — Progress Notes (Signed)
BP 114/76   Pulse 87   Wt 165 lb 4.8 oz (75 kg)   SpO2 100%   BMI 27.51 kg/m    Subjective:    Patient ID: Catherine Hughes, female    DOB: 07/22/1951, 67 y.o.   MRN: [id number]  HPI: Catherine Hughes is a 67 y.o. female  Chief Complaint  Patient presents with  . Follow-up  discussed with patient still having pain after surgery reviewed with patient that this office will not do any pain management especially postsurgical patient will contact her surgeon. I was able to discuss with her surgeon pain management and concerned about limitations with history of narcotics in the past. Patient surgeon also reviewed with me patient's problems with anxiety in the hospital and potential cardiac symptoms. Patient also in the hospital was noted to have elevated troponins. With recommendation of considering cardiac workup. Reviewed patient's anxiety and for now will continue her current Xanax and Flexerilwithout tried to do the switch over. On review will probably need to refer to psychiatry for further management.  Relevant past medical, surgical, family and social history reviewed and updated as indicated. Interim medical history since our last visit reviewed. Allergies and medications reviewed and updated.  Review of Systems  Constitutional: Negative.   Respiratory: Negative.   Cardiovascular: Negative.     Per HPI unless specifically indicated above     Objective:    BP 114/76   Pulse 87   Wt 165 lb 4.8 oz (75 kg)   SpO2 100%   BMI 27.51 kg/m   Wt Readings from Last 3 Encounters:  10/25/17 165 lb 4.8 oz (75 kg)  10/20/17 163 lb (73.9 kg)  09/04/17 164 lb (74.4 kg)    Physical Exam  Constitutional: She is oriented to person, place, and time. She appears well-developed and well-nourished.  HENT:  Head: Normocephalic and atraumatic.  Eyes: Conjunctivae and EOM are normal.  Neck: Normal range of motion.  Cardiovascular: Normal rate, regular rhythm and normal heart sounds.    Pulmonary/Chest: Effort normal and breath sounds normal.  Musculoskeletal: Normal range of motion.  Neurological: She is alert and oriented to person, place, and time.  Skin: No erythema.  Psychiatric: She has a normal mood and affect. Her behavior is normal. Judgment and thought content normal.    Results for orders placed or performed during the hospital encounter of 10/20/17  Surgical PCR screen  Result Value Ref Range   MRSA, PCR NEGATIVE NEGATIVE   Staphylococcus aureus NEGATIVE NEGATIVE  Lipase, blood  Result Value Ref Range   Lipase 32 11 - 51 U/L  Comprehensive metabolic panel  Result Value Ref Range   Sodium 138 135 - 145 mmol/L   Potassium 4.0 3.5 - 5.1 mmol/L   Chloride 102 101 - 111 mmol/L   CO2 26 22 - 32 mmol/L   Glucose, Bld 108 (H) 65 - 99 mg/dL   BUN 13 6 - 20 mg/dL   Creatinine, Ser 0.70 0.44 - 1.00 mg/dL   Calcium 9.6 8.9 - 10.3 mg/dL   Total Protein 7.8 6.5 - 8.1 g/dL   Albumin 4.0 3.5 - 5.0 g/dL   AST 18 15 - 41 U/L   ALT 17 14 - 54 U/L   Alkaline Phosphatase 88 38 - 126 U/L   Total Bilirubin 0.8 0.3 - 1.2 mg/dL   GFR calc non Af Amer >60 >60 mL/min   GFR calc Af Amer >60 >60 mL/min   Anion gap 10 5 -  15  CBC  Result Value Ref Range   WBC 7.3 3.6 - 11.0 K/uL   RBC 4.29 3.80 - 5.20 MIL/uL   Hemoglobin 13.2 12.0 - 16.0 g/dL   HCT 39.7 35.0 - 47.0 %   MCV 92.6 80.0 - 100.0 fL   MCH 30.8 26.0 - 34.0 pg   MCHC 33.2 32.0 - 36.0 g/dL   RDW 13.0 11.5 - 14.5 %   Platelets 353 150 - 440 K/uL  Urinalysis, Complete w Microscopic  Result Value Ref Range   Color, Urine STRAW (A) YELLOW   APPearance CLEAR (A) CLEAR   Specific Gravity, Urine 1.002 (L) 1.005 - 1.030   pH 6.0 5.0 - 8.0   Glucose, UA NEGATIVE NEGATIVE mg/dL   Hgb urine dipstick NEGATIVE NEGATIVE   Bilirubin Urine NEGATIVE NEGATIVE   Ketones, ur NEGATIVE NEGATIVE mg/dL   Protein, ur NEGATIVE NEGATIVE mg/dL   Nitrite NEGATIVE NEGATIVE   Leukocytes, UA NEGATIVE NEGATIVE   RBC / HPF 0-5 0 - 5  RBC/hpf   WBC, UA 0-5 0 - 5 WBC/hpf   Bacteria, UA NONE SEEN NONE SEEN   Squamous Epithelial / LPF 0-5 (A) NONE SEEN  Troponin I  Result Value Ref Range   Troponin I 0.09 (HH) <0.03 ng/mL  Troponin I  Result Value Ref Range   Troponin I 0.08 (HH) <0.03 ng/mL  Comprehensive metabolic panel  Result Value Ref Range   Sodium 140 135 - 145 mmol/L   Potassium 4.2 3.5 - 5.1 mmol/L   Chloride 107 101 - 111 mmol/L   CO2 27 22 - 32 mmol/L   Glucose, Bld 123 (H) 65 - 99 mg/dL   BUN 12 6 - 20 mg/dL   Creatinine, Ser 0.95 0.44 - 1.00 mg/dL   Calcium 9.1 8.9 - 10.3 mg/dL   Total Protein 6.9 6.5 - 8.1 g/dL   Albumin 3.4 (L) 3.5 - 5.0 g/dL   AST 17 15 - 41 U/L   ALT 12 (L) 14 - 54 U/L   Alkaline Phosphatase 74 38 - 126 U/L   Total Bilirubin 0.9 0.3 - 1.2 mg/dL   GFR calc non Af Amer >60 >60 mL/min   GFR calc Af Amer >60 >60 mL/min   Anion gap 6 5 - 15  CBC  Result Value Ref Range   WBC 6.4 3.6 - 11.0 K/uL   RBC 3.73 (L) 3.80 - 5.20 MIL/uL   Hemoglobin 11.5 (L) 12.0 - 16.0 g/dL   HCT 34.5 (L) 35.0 - 47.0 %   MCV 92.6 80.0 - 100.0 fL   MCH 30.9 26.0 - 34.0 pg   MCHC 33.4 32.0 - 36.0 g/dL   RDW 12.7 11.5 - 14.5 %   Platelets 284 150 - 440 K/uL  Troponin I  Result Value Ref Range   Troponin I 0.06 (HH) <0.03 ng/mL  Troponin I  Result Value Ref Range   Troponin I 0.06 (HH) <0.03 ng/mL  Comprehensive metabolic panel  Result Value Ref Range   Sodium 140 135 - 145 mmol/L   Potassium 4.0 3.5 - 5.1 mmol/L   Chloride 107 101 - 111 mmol/L   CO2 25 22 - 32 mmol/L   Glucose, Bld 139 (H) 65 - 99 mg/dL   BUN 10 6 - 20 mg/dL   Creatinine, Ser 0.75 0.44 - 1.00 mg/dL   Calcium 9.0 8.9 - 10.3 mg/dL   Total Protein 6.9 6.5 - 8.1 g/dL   Albumin 3.3 (L) 3.5 - 5.0 g/dL  AST 41 15 - 41 U/L   ALT 26 14 - 54 U/L   Alkaline Phosphatase 76 38 - 126 U/L   Total Bilirubin 0.6 0.3 - 1.2 mg/dL   GFR calc non Af Amer >60 >60 mL/min   GFR calc Af Amer >60 >60 mL/min   Anion gap 8 5 - 15  Troponin I    Result Value Ref Range   Troponin I 0.08 (HH) <0.03 ng/mL  Surgical pathology  Result Value Ref Range   SURGICAL PATHOLOGY      Surgical Pathology CASE: [id number] PATIENT: Catherine Hughes Surgical Pathology Report     SPECIMEN SUBMITTED: A. Gallbladder B. Pericolonic tissue  CLINICAL HISTORY: None provided  PRE-OPERATIVE DIAGNOSIS: Subacute cholecystitis  POST-OPERATIVE DIAGNOSIS: None provided.     DIAGNOSIS: A. GALLBLADDER; CHOLECYSTECTOMY: - ACUTE ON CHRONIC CHOLECYSTITIS WITH HEMORRHAGE AND MUCOSAL NECROSIS. - CHOLELITHIASIS. - NEGATIVE FOR DYSPLASIA AND MALIGNANCY.  B. PERICOLONIC TISSUE; EXCISION: - CYSTIC AND INFLAMED MESOTHELIAL-LINED FIBROADIPOSE TISSUE. - NEGATIVE FOR MALIGNANCY.   GROSS DESCRIPTION:  A. Labeled: gallbladder  Size of specimen: 13.5 x 4.8 x 3 cm  Previously opened: no  External surface: purple to tan shaggy  Wall thickness: 0.9 cm  Mucosa: shaggy brown to red and sloughing  Stones present: yes, multiple multifaceted black to yellow, aggregate 7.5 x 6.8 x 1.5 cm  Other findings: the duct margin inked blue  Block summary: 1 - 2-re presentative section and en face cystic duct margin   B. Labeled: pericolonic tissue  Tissue fragment(s): 1  Size: 3.5 x 1.8 x 1.1 cm  Description: multicystic fragment of wispy tan tissue, inked blue and sectioned, contains serous fluid  Entirely submitted in 1-2 cassette(s).    Final Diagnosis performed by Delorse Lek, MD.  Electronically signed 10/24/2017 12:53:47PM    The electronic signature indicates that the named Attending Pathologist has evaluated the specimen  Technical component performed at [facility], [address], [place], [place] [address] Lab: [phone] Dir: Rush Farmer, MD, MMM  Professional component performed at [facility], [facility], [place], [place], [place] [address] Lab: [phone] Dir: Dellia Nims. Rubinas, MD         Assessment & Plan:   Problem List Items Addressed This Visit      Digestive   Acute cholecystitis    Patient will discuss with her surgeon regarding  Pain care and treatment        Other   Generalized anxiety disorder (Chronic)    Reviewed anxiety will continueXanax and Flexeril with no change for now will continue changes in about a month and if a lot of resistance will consider psychiatric referral.      Relevant Medications   ALPRAZolam (XANAX) 1 MG tablet   Chest pain   Relevant Orders   Ambulatory referral to Cardiology       Follow up plan: Return in about 4 weeks (around 11/22/2017) for Recheck anxiety medication and treatment.

## 2017-10-27 ENCOUNTER — Telehealth: Payer: Self-pay | Admitting: Surgery

## 2017-10-27 NOTE — Telephone Encounter (Signed)
Patient has called and stated that she never received a call back from 10/25/17 in regards to her pain and pain medication. I reviewed the chart and seen that our [place] had returned the call and a voicemail was left. Patient stated that she was in so much pain-level 8-that she could not get out of bed, it hurt to walk, she had stabbing pains and when she used the bathroom she was doubled over. She also stated while speaking with me that she was laying in bed and could not move because of the pain and she felt as if she was going to urinate herself. I asked her if anyone was there with her and she stated no. I asked if her husband knew she was in this much pain and she stated yes, however he needed to work so he did not get fired. With the symptoms provided I advised the patient she needed to be seen at [facility] ED. I told her that there is a Psychologist, sport and exercise on call 24/7. She stated that she would go to [facility]. I advised her of her pre op appointment with [place] next week and she was ok with that date. She stated that she had no pain medication left and that she ran out on 10/25/17. Patient stated that she would go to the [facility] ED when her husband returned home from work.

## 2017-10-27 NOTE — Telephone Encounter (Signed)
Patient has

## 2017-10-28 NOTE — Discharge Summary (Signed)
Physician Discharge Summary  Patient ID: Catherine Hughes MRN: [id number] DOB/AGE: [birth year] 69 y.o.  Admit date: 10/20/2017 Discharge date: 10/28/2017  Admission Diagnoses:  Discharge Diagnoses:  Active Problems:   Preoperative evaluation of a medical condition to rule out surgical contraindications (TAR required)   Acute cholecystitis   [place] ischemia [facility])   Discharged Condition: good  Hospital Course: 67 y.o. female presented to [facility] ED for abdominal pain. Workup was found to be significant for CT imaging demonstrating calculous cholecystitis with an unusually enormous gallbladder, along with mildly elevated troponin-I to 0.09 despite EKG WNL. Troponin-I was repeated and found to be decreasing and medical + cardiology evaluations were appreciated with no further workup advised. Informed consent was obtained and documented, and patient underwent laparoscopic cholecystectomy with excisional biopsy of cystic peri-cecal tissue Rosana Hoes, 10/21/2017).  Post-operatively, patient's pain improved/resolved and advancement of patient's diet and ambulation were well-tolerated. On patient's day of planned discharge, however, patient again reported chest pain/tightness with feelings of anxiety.   Further workup including repeat EKG, troponins, and CXR were performed with no acute findings and medical consultant advised patient was okay for discharge from medical perspective. This was discussed with patient and her husband, after which patient described her symptoms resolved without any further intervention. The remainder of patient's hospital course was essentially unremarkable, and patient expressed she wanted to go home that evening. Discharge planning was initiated accordingly with patient safely able to be discharged home with appropriate discharge instructions, pain control, and outpatient surgical, medical, and planned cardiology follow-up after all of her and family's questions were answered to their  expressed satisfaction.  Consults: cardiology and hospitalist/internal medicine  Significant Diagnostic Studies: labs: Troponin 0.09 - 0.06, radiology: CT scan: calculous cholecystitis with enormous gallbladder and cardiac graphics: ECG: Sinus rhythm  Treatments: surgery: laparoscopic cholecystectomy  Discharge Exam: Blood pressure (!) 130/53, pulse 66, temperature 98.2 F (36.8 C), temperature source Oral, resp. rate 18, height 5\' 5"  (1.651 m), weight 163 lb (73.9 kg), SpO2 100 %. General appearance: alert, cooperative and no distress GI: abdomen soft and non-distended with mild/appropriate epigastric peri-incisional tenderness to palpation with post-surgical abdominal incisions well-approximated without erythema or drainage  Disposition: 01-Home or Self Care   Allergies as of 10/22/2017      Reactions   Tape Rash   Iodine Other (See Comments)   Pt states "causes blisters"    Morphine And Related    Morphine Contractions   Terfenadine Palpitations      Medication List    TAKE these medications   acidophilus Caps capsule Take 1 capsule by mouth daily.   acyclovir ointment 5 % Commonly known as:  ZOVIRAX Apply 1 application topically every 3 (three) hours.   clonazePAM 1 MG tablet Commonly known as:  KLONOPIN Take 1 tablet (1 mg total) 2 (two) times daily as needed by mouth for anxiety.   diazepam 5 MG tablet Commonly known as:  VALIUM Take 1 tablet (5 mg total) every 12 (twelve) hours as needed by mouth for anxiety.   ibuprofen 600 MG tablet Commonly known as:  ADVIL,MOTRIN TK 1 T PO  Q 6 TO 8 H PRN P   multivitamin tablet Take 1 tablet by mouth daily.   pantoprazole 40 MG tablet Commonly known as:  PROTONIX Take 1 tablet (40 mg total) by mouth daily.   temazepam 15 MG capsule Commonly known as:  RESTORIL Take 1 tablet nightly as needed for sleep   traMADol-acetaminophen 37.5-325 MG tablet Commonly known as:  ULTRACET Take 1-2 tablets by mouth every 4  (four) hours as needed for severe pain.   Vitamin D3 2000 units capsule Take 1 capsule (2,000 Units total) by mouth daily.      Follow-up [place]. Schedule an appointment as soon as possible for a visit in 2 week(s).   Specialty:  General Surgery Contact information: [address] [place]       Guadalupe Maple, MD. Schedule an appointment as soon as possible for a visit.   Specialty:  Family Medicine Why:  Call as soon as possible to schedule follow-up with primary medical physician and cardiology referral for evaluation considering mildly elevated Troponin this admission. Contact information: [place] [address] [phone]           Signed: Vickie Epley 10/28/2017, 8:25 AM

## 2017-10-30 ENCOUNTER — Other Ambulatory Visit: Payer: Self-pay

## 2017-10-31 NOTE — Telephone Encounter (Signed)
Patient has appointment on 11/02/17 with Dr. Dahlia Byes

## 2017-11-01 DIAGNOSIS — R1900 Intra-abdominal and pelvic swelling, mass and lump, unspecified site: Secondary | ICD-10-CM

## 2017-11-01 DIAGNOSIS — K812 Acute cholecystitis with chronic cholecystitis: Secondary | ICD-10-CM

## 2017-11-02 ENCOUNTER — Encounter: Payer: Self-pay | Admitting: Surgery

## 2017-11-02 ENCOUNTER — Ambulatory Visit (INDEPENDENT_AMBULATORY_CARE_PROVIDER_SITE_OTHER): Payer: Medicare Other | Admitting: Surgery

## 2017-11-02 VITALS — BP 104/69 | HR 98 | Temp 97.9°F | Ht 65.0 in | Wt 168.2 lb

## 2017-11-02 DIAGNOSIS — Z9049 Acquired absence of other specified parts of digestive tract: Secondary | ICD-10-CM

## 2017-11-02 MED ORDER — HYDROCODONE-ACETAMINOPHEN 5-325 MG PO TABS: 1.0000 | ORAL_TABLET | ORAL | 0 refills | Status: DC | PRN

## 2017-11-02 NOTE — Patient Instructions (Signed)
We have scheduled you for an Ultrasound of your Abdomen. This has been scheduled at [facility] [place] will need to arrive 15 minutes prior to schedule appointment checking in at 8:45AM. Bring a list of medications with you to your appointment.  Please do not have anything to eat or drink after midnight prior to your Ultrasound.   If you need to reschedule your Ultrasound, you may do so by calling [phone].   Abdominal Ultrasound An ultrasound is a test that uses sound waves to take pictures of the inside of the body. An abdominal ultrasound takes pictures of the inside of the abdomen, and a pelvic ultrasound takes pictures of the inside of the pelvis. An abdominal or pelvic ultrasound may be done:  To provide information about a baby developing in the womb, such as the baby's position and heartbeat.  To check the shape or size of an organ.  To check for problems such as: ? Cysts. ? Masses. ? Inflammation. ? Kidney stones. ? Gallstones.  Tell a health care provider about:  Any allergies you have.  All medicines you are taking, including vitamins, herbs, eye drops, creams, and over-the-counter medicines.  Previous surgeries you have had.  Any medical conditions you have.  Whether you are pregnant or may be pregnant. What are the risks? There are no known risks or complications from having this test. What happens before the procedure?  Follow instructions from your health care provider about eating or drinking restrictions.  Wear clothing that is easily washable in case the gel used for the test gets on your clothes. What happens during the procedure?  A gel will be applied to your skin. It may feel cool.  A wand-like device called a transducer will be placed on the area to be examined.  The transducer will take pictures. These will be displayed on one or more monitors that look like small TV screens. What happens after the procedure?  It is your  responsibility to get your test results. Ask your health care provider or the department performing the test when your results will be ready.  Keep follow-up visits as told by your health care provider. This is important. This information is not intended to replace advice given to you by your health care provider. Make sure you discuss any questions you have with your health care provider. Document Released: 09/30/2000 Document Revised: 06/15/2016 Document Reviewed: 06/26/2015 Elsevier Interactive Patient Education  [employer].  We have ordered some labs to be drawn today. Please proceed to the [facility] to have these tests completed prior to leaving today. You will check in at the registration desk in the medical mall. Please see walking directions below if needed.  We will call you with the results and next step in plan of care as soon as results are received.   Directions to Medical Mall: When leaving our office, go right. Go all of the way down to the very end of the hallway. You will have a purple wall in front of you. You will now have a tunnel to the hospital on your left hand side. Go through this tunnel and the elevators will be on your left. Go down to the 1st floor and take a slight left. The very first desk on the right hand side is the registration desk.  We will call you with these results and review them to let you know if you need to be seen Friday or first  thing next week

## 2017-11-02 NOTE — Progress Notes (Signed)
S/p lap chole by Dr. Rosana Hoes 1/5 She does have a hx of narcotic issues in the past. Comes for worsening pain, more prominent on epigastric incision. No fever or chills D/W pt about path Taking PO  PE NAD Abd: soft, mild TTP w epigastric incision. No peritonitis or infection  A/P Incisional pain, I will need to exclude any potential bile leak and we will order labs and U/S RUQ, if no issues may see Dr. Rosana Hoes as previously schedule. She is not toxic and in no need for emergent surgical interventions.  I will prescribe a short course of Norco

## 2017-11-03 ENCOUNTER — Encounter: Payer: Self-pay | Admitting: Surgery

## 2017-11-03 ENCOUNTER — Other Ambulatory Visit
Admission: RE | Admit: 2017-11-03 | Discharge: 2017-11-03 | Disposition: A | Discharge status: Home / Self Care | Payer: Medicare Other | Source: Ambulatory Visit | Attending: Surgery | Admitting: Surgery

## 2017-11-03 ENCOUNTER — Ambulatory Visit
Admission: RE | Admit: 2017-11-03 | Discharge: 2017-11-03 | Disposition: A | Discharge status: Home / Self Care | Payer: Medicare Other | Source: Ambulatory Visit | Attending: Surgery | Admitting: Surgery

## 2017-11-03 ENCOUNTER — Telehealth: Payer: Self-pay

## 2017-11-03 DIAGNOSIS — R1011 Right upper quadrant pain: Secondary | ICD-10-CM | POA: Diagnosis not present

## 2017-11-03 DIAGNOSIS — Z9049 Acquired absence of other specified parts of digestive tract: Secondary | ICD-10-CM | POA: Insufficient documentation

## 2017-11-03 LAB — CBC WITH DIFFERENTIAL/PLATELET
BASOS ABS: 0 10*3/uL (ref 0–0.1)
Basophils Relative: 1 %
EOS ABS: 0.5 10*3/uL (ref 0–0.7)
EOS PCT: 6 %
HCT: 38.9 % (ref 35.0–47.0)
Hemoglobin: 12.9 g/dL (ref 12.0–16.0)
Lymphocytes Relative: 32 %
Lymphs Abs: 2.9 10*3/uL (ref 1.0–3.6)
MCH: 30.1 pg (ref 26.0–34.0)
MCHC: 33.1 g/dL (ref 32.0–36.0)
MCV: 91.1 fL (ref 80.0–100.0)
MONO ABS: 0.6 10*3/uL (ref 0.2–0.9)
Monocytes Relative: 7 %
Neutro Abs: 5 10*3/uL (ref 1.4–6.5)
Neutrophils Relative %: 54 %
PLATELETS: 368 10*3/uL (ref 150–440)
RBC: 4.28 MIL/uL (ref 3.80–5.20)
RDW: 12.7 % (ref 11.5–14.5)
WBC: 9 10*3/uL (ref 3.6–11.0)

## 2017-11-03 LAB — COMPREHENSIVE METABOLIC PANEL
ALBUMIN: 3.9 g/dL (ref 3.5–5.0)
ALK PHOS: 109 U/L (ref 38–126)
ALT: 14 U/L (ref 14–54)
AST: 20 U/L (ref 15–41)
Anion gap: 9 (ref 5–15)
BILIRUBIN TOTAL: 0.8 mg/dL (ref 0.3–1.2)
BUN: 21 mg/dL — AB (ref 6–20)
CALCIUM: 9.5 mg/dL (ref 8.9–10.3)
CO2: 24 mmol/L (ref 22–32)
CREATININE: 0.72 mg/dL (ref 0.44–1.00)
Chloride: 105 mmol/L (ref 101–111)
GFR calc Af Amer: >60 mL/min (ref >60)
GLUCOSE: 97 mg/dL (ref 65–99)
Potassium: 4.4 mmol/L (ref 3.5–5.1)
Sodium: 138 mmol/L (ref 135–145)
TOTAL PROTEIN: 7.4 g/dL (ref 6.5–8.1)

## 2017-11-03 NOTE — Telephone Encounter (Signed)
Call made to patient at this time to give her results of her [place]. Left a message for her to call back for these results as well as letting her know that I have scheduled her an appointment for 1/21 with Dr. Rosana Hoes.

## 2017-11-06 ENCOUNTER — Encounter: Payer: Self-pay | Admitting: Surgery

## 2017-11-06 ENCOUNTER — Ambulatory Visit (INDEPENDENT_AMBULATORY_CARE_PROVIDER_SITE_OTHER): Payer: Medicare Other | Admitting: Surgery

## 2017-11-06 VITALS — BP 118/53 | HR 80 | Temp 98.3°F | Ht 65.0 in | Wt 168.0 lb

## 2017-11-06 DIAGNOSIS — K812 Acute cholecystitis with chronic cholecystitis: Secondary | ICD-10-CM

## 2017-11-06 NOTE — Progress Notes (Signed)
Surgical Clinic Progress/Follow-up Note   HPI:  67 y.o. Female presents to clinic for post-op follow-up evaluation s/p laparoscopic cholecystectomy for acute on chronic cholecystitis involving her very large gallbladder and biopsy of cystic pericecal tissue. Patient reports her peri-epigastric abdominal pain has been "improving each day" and is now much-improved and "tolerable" without need for narcotic pain medication. She has otherwise been tolerating regular diet ("eating too much, not drinking enough water") with +flatus and normal BM's, denies N/V, fever/chills, CP, or SOB.  Review of Systems:  Constitutional: denies any other weight loss, fever, chills, or sweats  Eyes: denies any other vision changes, history of eye injury  ENT: denies sore throat, hearing problems  Respiratory: denies shortness of breath, wheezing  Cardiovascular: denies chest pain, palpitations  Gastrointestinal: abdominal pain, N/V, and bowel function as per HPI Musculoskeletal: denies any other joint pains or cramps  Skin: Denies any other rashes or skin discolorations  Neurological: denies any other headache, dizziness, weakness  Psychiatric: denies any other depression, anxiety  All other review of systems: otherwise negative   Vital Signs:  BP (!) 118/53   Pulse 80   Temp 98.3 F (36.8 C) (Oral)   Ht 5\' 5"  (1.651 m)   Wt 168 lb (76.2 kg)   BMI 27.96 kg/m    Physical Exam:  Constitutional:  -- Normal body habitus  -- Awake, alert, and oriented x3  Eyes:  -- Pupils equally round and reactive to light  -- No scleral icterus  Ear, nose, throat:  -- No jugular venous distension  -- No nasal drainage, bleeding Pulmonary:  -- No crackles -- Equal breath sounds bilaterally -- Breathing non-labored at rest Cardiovascular:  -- S1, S2 present  -- No pericardial rubs  Gastrointestinal:  -- Soft, nontender, non-distended, no guarding/rebound -- Incisions well-approximated without surrounding  erythema or drainage -- No abdominal masses appreciated, pulsatile or otherwise  Musculoskeletal / Integumentary:  -- Wounds or skin discoloration: None appreciated except post-surgical wounds as described above (GI) -- Extremities: B/L UE and LE FROM, hands and feet warm, no edema  Neurologic:  -- Motor function: intact and symmetric  -- Sensation: intact and symmetric   Laboratory studies:  CBC    Component Value Date/Time   WBC 9.0 11/03/2017 1024   RBC 4.28 11/03/2017 1024   HGB 12.9 11/03/2017 1024   HGB 13.5 06/28/2017 1634   HCT 38.9 11/03/2017 1024   HCT 40.6 06/28/2017 1634   PLT 368 11/03/2017 1024   PLT 272 06/28/2017 1634   MCV 91.1 11/03/2017 1024   MCV 92 06/28/2017 1634   MCV 92 01/18/2012 0357   MCH 30.1 11/03/2017 1024   MCHC 33.1 11/03/2017 1024   RDW 12.7 11/03/2017 1024   RDW 13.7 06/28/2017 1634   RDW 14.2 01/18/2012 0357   LYMPHSABS 2.9 11/03/2017 1024   LYMPHSABS 2.0 06/28/2017 1634   LYMPHSABS 1.9 01/18/2012 0357   MONOABS 0.6 11/03/2017 1024   MONOABS 0.6 01/18/2012 0357   EOSABS 0.5 11/03/2017 1024   EOSABS 0.1 06/28/2017 1634   EOSABS 0.3 01/18/2012 0357   BASOSABS 0.0 11/03/2017 1024   BASOSABS 0.0 06/28/2017 1634   BASOSABS 0.0 01/18/2012 0357   CMP Latest Ref Rng & Units 11/03/2017 10/22/2017 10/21/2017  Glucose 65 - 99 mg/dL 97 139(H) 123(H)  BUN 6 - 20 mg/dL 21(H) 10 12  Creatinine 0.44 - 1.00 mg/dL 0.72 0.75 0.95  Sodium 135 - 145 mmol/L 138 140 140  Potassium 3.5 - 5.1 mmol/L 4.4 4.0  4.2  Chloride 101 - 111 mmol/L 105 107 107  CO2 22 - 32 mmol/L 24 25 27   Calcium 8.9 - 10.3 mg/dL 9.5 9.0 9.1  Total Protein 6.5 - 8.1 g/dL 7.4 6.9 6.9  Total Bilirubin 0.3 - 1.2 mg/dL 0.8 0.6 0.9  Alkaline Phos 38 - 126 U/L 109 76 74  AST 15 - 41 U/L 20 41 17  ALT 14 - 54 U/L 14 26 12(L)   Imaging:  Complete Abdominal Ultrasound (11/03/2017) Gallbladder: Surgically absent. No ultrasound abnormality in  the gallbladder fossa. Common bile duct:  Diameter: 6 millimeters. Liver: No focal lesion identified. Within normal limits in parenchymal echogenicity. Portal vein is patent on color Doppler imaging with normal direction of blood flow towards the liver. IVC: No abnormality visualized.  Normal ultrasound appearance of the abdomen status post cholecystectomy.  Assessment:  67 y.o. yo Female with a problem list including...  Patient Active Problem List   Diagnosis Date Noted  . Acute on chronic cholecystitis   . Intraabdominal mass   . Chest pain 10/25/2017  . Demand ischemia ([place])   . Acute cholecystitis 10/20/2017  . Chronic fatigue 06/28/2017  . Insomnia 02/13/2017  . Cervical paraspinal muscle spasm 02/13/2017  . Preoperative evaluation of a medical condition to rule out surgical contraindications (TAR required) 03/10/2016  . Chronic prescription benzodiazepine use 12/29/2015  . Elevated C-reactive protein (CRP) 12/15/2015  . Vitamin D deficiency 12/15/2015  . Pelvic pain in female 12/14/2015  . Lumbar facet syndrome 12/04/2015  . Chronic lumbar radicular pain (Location of Secondary source of pain) (Bilateral) (L>R) (S1 Dermatome) 12/04/2015  . 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 Chronic headaches (Location of Tertiary source of pain) (Bilateral) (L>R) (distribution of the lesser occipital nerve) 12/04/2015  . Fibromyalgia 12/04/2015  . Neurogenic pain 12/04/2015  . History of chronic fatigue syndrome 12/04/2015  . Generalized anxiety disorder 12/04/2015  . Chronic pain syndrome 12/02/2015  . Chronic low back pain (Location of Primary Source of Pain) (Bilateral) (L>R) 12/02/2015  . Chronic lower extremity pain (Location of Secondary source of pain) (Bilateral) (L>R) 12/02/2015  . Cystocele, grade 2 07/19/2015  . Depression 07/13/2015  . Atrophic vaginitis 06/22/2015  . History of spinal fusion (L4-L5 and L5-S1) 03/19/2015    presents to clinic for post-op follow-up evaluation, doing  well s/p laparoscopic cholecystectomy for acute on chronic cholecystitis involving her very large gallbladder and biopsy of cystic pericecal tissue.  Plan:              - advance diet as tolerated  - post-surgical pathology results communicated  - discussed results of recent abdominal ultrasound and labs             - okay to submerge incisions under water (baths, swimming) prn             - gradually resume all activities without restrictions over next 2 weeks             - apply sunblock particularly to incisions with sun exposure to reduce pigmentation of scars             - return to clinic as needed, instructed to call office if any questions or concerns   All of the above recommendations were discussed with the patient and her husband, and all of patient's and family's questions were answered to their expressed satisfaction.  -- Marilynne Drivers Rosana Hoes, MD, [place]: [facility]  for exceptional care. Office: [phone]

## 2017-11-06 NOTE — Patient Instructions (Signed)
Please give us a call in case you have any questions or concerns.  

## 2018-01-11 ENCOUNTER — Encounter: Payer: Self-pay | Admitting: Family Medicine

## 2018-01-11 ENCOUNTER — Telehealth: Payer: Self-pay | Admitting: Family Medicine

## 2018-01-11 ENCOUNTER — Other Ambulatory Visit: Payer: Self-pay | Admitting: Family Medicine

## 2018-01-11 ENCOUNTER — Ambulatory Visit: Payer: Medicare Other | Admitting: Family Medicine

## 2018-01-11 VITALS — BP 117/78 | HR 68 | Temp 99.3°F | Ht 65.0 in | Wt 171.7 lb

## 2018-01-11 DIAGNOSIS — F411 Generalized anxiety disorder: Secondary | ICD-10-CM

## 2018-01-11 DIAGNOSIS — G4700 Insomnia, unspecified: Secondary | ICD-10-CM

## 2018-01-11 DIAGNOSIS — J019 Acute sinusitis, unspecified: Secondary | ICD-10-CM | POA: Diagnosis not present

## 2018-01-11 MED ORDER — TEMAZEPAM 15 MG PO CAPS: ORAL_CAPSULE | ORAL | 0 refills | Status: DC

## 2018-01-11 MED ORDER — ALPRAZOLAM 1 MG PO TABS: 1.0000 mg | ORAL_TABLET | Freq: Three times a day (TID) | ORAL | 1 refills | Status: DC | PRN

## 2018-01-11 MED ORDER — AMOXICILLIN-POT CLAVULANATE 875-125 MG PO TABS
1.0000 | ORAL_TABLET | Freq: Two times a day (BID) | ORAL | 0 refills | Status: DC
Start: 2018-01-11 — End: 2018-03-22

## 2018-01-11 NOTE — Telephone Encounter (Signed)
Patient requesting refill on prescription for Restoril sent to [facility] in [place], [place].   Please Advise.  Thank you

## 2018-01-11 NOTE — Telephone Encounter (Signed)
LOV  10/25/17 Dr. Neysa Hotter

## 2018-01-11 NOTE — Assessment & Plan Note (Signed)
Continue anxiety treatment with Xanax 1 mg 3 times daily 90 with 1 refill

## 2018-01-11 NOTE — Assessment & Plan Note (Signed)
Discussed insomnia care and treatment patient not willing to go to a sleep specialist discussed with sleeping aids not helping to stop for at least 2 weeks then may restart this may become effective again.

## 2018-01-11 NOTE — Progress Notes (Signed)
BP 117/78   Pulse 68   Temp 99.3 F (37.4 C) (Oral)   Ht 5\' 5"  (1.651 m)   Wt 171 lb 11.2 oz (77.9 kg)   SpO2 98%   BMI 28.57 kg/m    Subjective:    Patient ID: Catherine Hughes, female    DOB: [birth year], 67 y.o.   MRN: [id number]  HPI: Catherine Hughes is a 67 y.o. female  Chief Complaint  Patient presents with  . Follow-up  . Anxiety  . URI    x 3 weeks, h/a, sneezing, cough - whole family has had it  Patient with sinus congestion drainage facial pressure is been getting worse over the last 3 weeks Is been using over-the-counter medication just not doing it. Patient also with continued insomnia Restoril at bedtime and cyclobenzaprine and Xanax at bedtime not helping with sleep.  Relevant past medical, surgical, family and social history reviewed and updated as indicated. Interim medical history since our last visit reviewed. Allergies and medications reviewed and updated.  Review of Systems  Constitutional: Negative.   Respiratory: Negative.   Cardiovascular: Negative.     Per HPI unless specifically indicated above     Objective:    BP 117/78   Pulse 68   Temp 99.3 F (37.4 C) (Oral)   Ht 5\' 5"  (1.651 m)   Wt 171 lb 11.2 oz (77.9 kg)   SpO2 98%   BMI 28.57 kg/m   Wt Readings from Last 3 Encounters:  01/11/18 171 lb 11.2 oz (77.9 kg)  11/06/17 168 lb (76.2 kg)  11/02/17 168 lb 3.2 oz (76.3 kg)    Physical Exam  Constitutional: She is oriented to person, place, and time. She appears well-developed and well-nourished.  HENT:  Head: Normocephalic and atraumatic.  Eyes: Conjunctivae and EOM are normal.  Neck: Normal range of motion.  Cardiovascular: Normal rate, regular rhythm and normal heart sounds.  Pulmonary/Chest: Effort normal and breath sounds normal.  Musculoskeletal: Normal range of motion.  Neurological: She is alert and oriented to person, place, and time.  Skin: No erythema.  Psychiatric: She has a normal mood and affect. Her behavior is  normal. Judgment and thought content normal.    Results for orders placed or performed during the hospital encounter of 11/03/17  Comprehensive metabolic panel  Result Value Ref Range   Sodium 138 135 - 145 mmol/L   Potassium 4.4 3.5 - 5.1 mmol/L   Chloride 105 101 - 111 mmol/L   CO2 24 22 - 32 mmol/L   Glucose, Bld 97 65 - 99 mg/dL   BUN 21 (H) 6 - 20 mg/dL   Creatinine, Ser 0.72 0.44 - 1.00 mg/dL   Calcium 9.5 8.9 - 10.3 mg/dL   Total Protein 7.4 6.5 - 8.1 g/dL   Albumin 3.9 3.5 - 5.0 g/dL   AST 20 15 - 41 U/L   ALT 14 14 - 54 U/L   Alkaline Phosphatase 109 38 - 126 U/L   Total Bilirubin 0.8 0.3 - 1.2 mg/dL   GFR calc non Af Amer >60 >60 mL/min   GFR calc Af Amer >60 >60 mL/min   Anion gap 9 5 - 15  CBC with Differential/Platelet  Result Value Ref Range   WBC 9.0 3.6 - 11.0 K/uL   RBC 4.28 3.80 - 5.20 MIL/uL   Hemoglobin 12.9 12.0 - 16.0 g/dL   HCT 38.9 35.0 - 47.0 %   MCV 91.1 80.0 - 100.0 fL   MCH 30.1  26.0 - 34.0 pg   MCHC 33.1 32.0 - 36.0 g/dL   RDW 12.7 11.5 - 14.5 %   Platelets 368 150 - 440 K/uL   Neutrophils Relative % 54 %   Neutro Abs 5.0 1.4 - 6.5 K/uL   Lymphocytes Relative 32 %   Lymphs Abs 2.9 1.0 - 3.6 K/uL   Monocytes Relative 7 %   Monocytes Absolute 0.6 0.2 - 0.9 K/uL   Eosinophils Relative 6 %   Eosinophils Absolute 0.5 0 - 0.7 K/uL   Basophils Relative 1 %   Basophils Absolute 0.0 0 - 0.1 K/uL      Assessment & Plan:   Problem List Items Addressed This Visit      Other   Generalized anxiety disorder (Chronic)    Continue anxiety treatment with Xanax 1 mg 3 times daily 90 with 1 refill      Relevant Medications   ALPRAZolam (XANAX) 1 MG tablet   Insomnia    Discussed insomnia care and treatment patient not willing to go to a sleep specialist discussed with sleeping aids not helping to stop for at least 2 weeks then may restart this may become effective again.       Other Visit Diagnoses    Acute sinusitis, recurrence not specified,  unspecified location    -  Primary   Relevant Medications   amoxicillin-clavulanate (AUGMENTIN) 875-125 MG tablet    Discussed sinusitis care and treatment use of antibiotics and medications  Follow up plan: No follow-ups on file.

## 2018-01-27 DIAGNOSIS — H5319 Other subjective visual disturbances: Secondary | ICD-10-CM | POA: Diagnosis not present

## 2018-02-07 ENCOUNTER — Other Ambulatory Visit: Payer: Self-pay | Admitting: Family Medicine

## 2018-02-14 ENCOUNTER — Telehealth: Payer: Self-pay | Admitting: Family Medicine

## 2018-02-14 NOTE — Telephone Encounter (Signed)
Patient is going out of town and would like these filled before she goes. Attempted to call the patient to discuss her request, unable to leave VM due to no VM set up.  Xanax-last filled 01/11/18 90 tab/1 refill Flexeril-last filled 02/07/18 90 tab/1 refill Restoril-last filled 02/07/18 30 tab/1 refill  Last OV:01/11/18 [id number] Pharmacy: [facility] [place], [address] AT [address] [phone] (Phone) [phone] (Fax)

## 2018-02-14 NOTE — Telephone Encounter (Signed)
Call pt 

## 2018-02-14 NOTE — Telephone Encounter (Signed)
Copied from [place] [id number]. Topic: Quick Communication - Rx Refill/Question >> Feb 14, 2018  1:58 PM Oliver Pila B wrote: Medication: pt called to talk about the following medications below she will going out on vacation and will need her medications before she leaves, call pt to advise ALPRAZolam Duanne Moron) 1 MG tablet [[id number]]  cyclobenzaprine (FLEXERIL) 5 MG tablet [[id number]]  temazepam (RESTORIL) 15 MG capsule [[id number]]

## 2018-02-15 NOTE — Telephone Encounter (Signed)
Attempted to reach. No answer.  

## 2018-02-15 NOTE — Telephone Encounter (Signed)
Patient was transferred to provider for telephone conversation.   

## 2018-02-15 NOTE — Telephone Encounter (Signed)
Patient is returning phone call to [place]. Please call back.

## 2018-02-15 NOTE — Telephone Encounter (Signed)
VM box not set up

## 2018-02-23 DIAGNOSIS — Z1231 Encounter for screening mammogram for malignant neoplasm of breast: Secondary | ICD-10-CM | POA: Diagnosis not present

## 2018-02-23 DIAGNOSIS — Z853 Personal history of malignant neoplasm of breast: Secondary | ICD-10-CM | POA: Diagnosis not present

## 2018-02-23 LAB — HM MAMMOGRAPHY

## 2018-03-08 DIAGNOSIS — H524 Presbyopia: Secondary | ICD-10-CM | POA: Diagnosis not present

## 2018-03-13 ENCOUNTER — Other Ambulatory Visit: Payer: Self-pay | Admitting: Family Medicine

## 2018-03-13 DIAGNOSIS — N6489 Other specified disorders of breast: Secondary | ICD-10-CM | POA: Diagnosis not present

## 2018-03-15 NOTE — Telephone Encounter (Signed)
Patient has appointment scheduled for July 25th. She wants to know if Dr. Jeananne Rama will refill again before this appointment or if she needs to come in June to make sure she does not run out? Pt will leave for vacation June 25th, and Dr. Jeananne Rama has no availability until after that. If you will have to see her we will have to overbook. Please advise.

## 2018-03-15 NOTE — Telephone Encounter (Signed)
Pt called to check on status of refill  

## 2018-03-15 NOTE — Telephone Encounter (Signed)
Please ask Dr Jeananne Rama if he is willing to refill before she leaves or to be overbooked.  I am not comfortable filling this rx again

## 2018-03-15 NOTE — Telephone Encounter (Signed)
I will only prescribe this for 1 refill.  Will need to be seen for further refills

## 2018-03-22 ENCOUNTER — Other Ambulatory Visit: Payer: Self-pay | Admitting: Family Medicine

## 2018-03-22 MED ORDER — ALPRAZOLAM 1 MG PO TABS: 1.0000 mg | ORAL_TABLET | Freq: Three times a day (TID) | ORAL | 1 refills | Status: DC | PRN

## 2018-04-09 ENCOUNTER — Telehealth: Payer: Self-pay | Admitting: Family Medicine

## 2018-04-09 MED ORDER — TEMAZEPAM 15 MG PO CAPS: ORAL_CAPSULE | ORAL | 0 refills | Status: DC

## 2018-04-09 MED ORDER — CYCLOBENZAPRINE HCL 5 MG PO TABS: ORAL_TABLET | ORAL | 0 refills | Status: DC

## 2018-04-09 NOTE — Telephone Encounter (Signed)
Catherine Hughes, Pt is leaving for out of town and would like for Dr. Jeananne Rama to have these  3 Rx refilled.  ALPRAZolam 04/10/18  (XANAX) 1 MG tablet [[id number]]   cyclobenzaprine 04/10/18 (FLEXERIL) 5 MG tablet [[id number]]   temazepam 04/13/18 (RESTORIL) 15 MG capsule [[id number]]   She asked that you please call her if you have any issues refilling.   Thanks, Curt Bears

## 2018-04-09 NOTE — Telephone Encounter (Signed)
Verbal from Dr.Johnson, to call in  flexeril 5mg  90 w/ 0 refills Temazepam 15mg  30 w/ 0 refills

## 2018-04-09 NOTE — Telephone Encounter (Signed)
Xanax was given refill on 03/22/18- not due.  Temezepam and flexeril were given 60 days on 4/24- should not be due until 7/24

## 2018-04-11 ENCOUNTER — Other Ambulatory Visit: Payer: Self-pay | Admitting: Family Medicine

## 2018-05-10 ENCOUNTER — Encounter: Payer: Self-pay | Admitting: Family Medicine

## 2018-05-10 ENCOUNTER — Ambulatory Visit (INDEPENDENT_AMBULATORY_CARE_PROVIDER_SITE_OTHER): Payer: Medicare Other | Admitting: Family Medicine

## 2018-05-10 DIAGNOSIS — G4700 Insomnia, unspecified: Secondary | ICD-10-CM

## 2018-05-10 DIAGNOSIS — F419 Anxiety disorder, unspecified: Secondary | ICD-10-CM

## 2018-05-10 MED ORDER — TEMAZEPAM 15 MG PO CAPS: ORAL_CAPSULE | ORAL | 1 refills | Status: DC

## 2018-05-10 MED ORDER — ALPRAZOLAM 1 MG PO TABS: 1.0000 mg | ORAL_TABLET | Freq: Three times a day (TID) | ORAL | 1 refills | Status: DC | PRN

## 2018-05-10 NOTE — Assessment & Plan Note (Addendum)
psy referral for management  Gave refill for Xanax

## 2018-05-10 NOTE — Progress Notes (Signed)
   BP 110/74   Pulse 76   Wt 171 lb 6 oz (77.7 kg)   SpO2 98%   BMI 28.52 kg/m    Subjective:    Patient ID: Catherine Hughes, female    DOB: Jan 19, 1951, 67 y.o.   MRN: [id number]  HPI: Catherine Hughes is a 67 y.o. female  Leg edema  Patient with concerns about bilateral leg edema been ongoing for the last 7 to 8 months getting worse over this last summer.  Is also developed over the last 3 months or so some intermittent sharp pain that develops in her ankle area more in her right leg down on her left leg.  This goes away with elevation of her feet and resolution of her leg swelling.  Was going on by probably that with standing. Patient has no PND orthopnea nocturia. Patient with ongoing insomnia and anxiety.  Is been taking Xanax 1 mg 3 times a day long-term taking temazepam 10 mg at bedtime.  Patient actually doing real well with this regimen continue.  She does understand that addresses her goal of trying to taper Xanax and/or Restoril.  Relevant past medical, surgical, family and social history reviewed and updated as indicated. Interim medical history since our last visit reviewed. Allergies and medications reviewed and updated.  Review of Systems  Constitutional: Negative.   Respiratory: Negative.   Cardiovascular: Negative.      Per HPI unless specifically indicated above     Objective:    BP 110/74   Pulse 76   Wt 171 lb 6 oz (77.7 kg)   SpO2 98%   BMI 28.52 kg/m   Wt Readings from Last 3 Encounters:  05/10/18 171 lb 6 oz (77.7 kg)  01/11/18 171 lb 11.2 oz (77.9 kg)  11/06/17 168 lb (76.2 kg)    Physical Exam  Constitutional: She is oriented to person, place, and time. She appears well-developed and well-nourished.  HENT:  Head: Normocephalic and atraumatic.  Eyes: Conjunctivae and EOM are normal.  Neck: Normal range of motion.  Cardiovascular: Normal rate, regular rhythm and normal heart sounds.  Pulmonary/Chest: Effort normal and breath sounds normal.    Musculoskeletal: Normal range of motion.  Neurological: She is alert and oriented to person, place, and time.  Skin: No erythema.  Psychiatric: She has a normal mood and affect. Her behavior is normal. Judgment and thought content normal.    Results for orders placed or performed in visit on 02/26/18  HM MAMMOGRAPHY  Result Value Ref Range   HM Mammogram 0-4 Bi-Rad 0-4 Bi-Rad, Self Reported Normal      Assessment & Plan:   Problem List Items Addressed This Visit      Other   Insomnia    For ongoing insomnia gave patient refill and furl to psychiatry see anxiety note below.      Anxiety    psy referral for management  Gave refill for Xanax      Relevant Medications   ALPRAZolam (XANAX) 1 MG tablet   Other Relevant Orders   Ambulatory referral to Psychiatry       Follow up plan: Return in about 3 months (around 08/10/2018).

## 2018-05-10 NOTE — Assessment & Plan Note (Signed)
For ongoing insomnia gave patient refill and furl to psychiatry see anxiety note below.

## 2018-05-22 NOTE — Addendum Note (Signed)
Addended by: Golden Pop A on: 05/22/2018 12:42 PM   Modules accepted: Orders

## 2018-06-03 DIAGNOSIS — R600 Localized edema: Secondary | ICD-10-CM | POA: Diagnosis not present

## 2018-06-03 DIAGNOSIS — Z5181 Encounter for therapeutic drug level monitoring: Secondary | ICD-10-CM | POA: Diagnosis not present

## 2018-06-03 DIAGNOSIS — Z87891 Personal history of nicotine dependence: Secondary | ICD-10-CM | POA: Diagnosis not present

## 2018-06-03 DIAGNOSIS — M25473 Effusion, unspecified ankle: Secondary | ICD-10-CM | POA: Diagnosis not present

## 2018-06-03 DIAGNOSIS — M7989 Other specified soft tissue disorders: Secondary | ICD-10-CM | POA: Diagnosis not present

## 2018-06-03 DIAGNOSIS — M79601 Pain in right arm: Secondary | ICD-10-CM | POA: Diagnosis not present

## 2018-06-03 DIAGNOSIS — Z79899 Other long term (current) drug therapy: Secondary | ICD-10-CM | POA: Diagnosis not present

## 2018-06-03 DIAGNOSIS — M25579 Pain in unspecified ankle and joints of unspecified foot: Secondary | ICD-10-CM | POA: Diagnosis not present

## 2018-06-03 DIAGNOSIS — R0789 Other chest pain: Secondary | ICD-10-CM | POA: Diagnosis not present

## 2018-06-03 DIAGNOSIS — R51 Headache: Secondary | ICD-10-CM | POA: Diagnosis not present

## 2018-06-03 DIAGNOSIS — R0602 Shortness of breath: Secondary | ICD-10-CM | POA: Diagnosis not present

## 2018-06-03 DIAGNOSIS — J029 Acute pharyngitis, unspecified: Secondary | ICD-10-CM | POA: Diagnosis not present

## 2018-06-11 ENCOUNTER — Ambulatory Visit (INDEPENDENT_AMBULATORY_CARE_PROVIDER_SITE_OTHER): Payer: Medicare Other

## 2018-06-11 VITALS — BP 112/78 | HR 92 | Temp 98.0°F | Resp 16 | Ht 64.0 in | Wt 168.9 lb

## 2018-06-11 DIAGNOSIS — Z0000 Encounter for general adult medical examination without abnormal findings: Secondary | ICD-10-CM | POA: Diagnosis not present

## 2018-06-11 NOTE — Progress Notes (Signed)
Subjective:   RUBBY BARBARY is a 67 y.o. female who presents for Medicare Annual (Subsequent) preventive examination.  Review of Systems:  Cardiac Risk Factors include: advanced age (>6men, >82 women)     Objective:     Vitals: BP 112/78 (BP Location: Left Arm, Patient Position: Sitting)   Pulse 92   Temp 98 F (36.7 C) (Temporal)   Resp 16   Ht 5\' 4"  (1.626 m)   Wt 168 lb 14.4 oz (76.6 kg)   SpO2 98%   BMI 28.99 kg/m   Body mass index is 28.99 kg/m.  Advanced Directives 06/11/2018 10/20/2017 10/20/2017 07/25/2017 06/07/2017 03/10/2016 12/02/2015  Does Patient Have a Medical Advance Directive? No No No No No No No  Would patient like information on creating a medical advance directive? Yes (MAU/Ambulatory/Procedural Areas - Information given) Yes (Inpatient - patient requests chaplain consult to create a medical advance directive) No - Patient declined No - Patient declined Yes (MAU/Ambulatory/Procedural Areas - Information given) - No - patient declined information  Some encounter information is confidential and restricted. Go to Review Flowsheets activity to see all data.    Tobacco Social History   Tobacco Use  Smoking Status Former Smoker  . Last attempt to quit: 03/18/1989  . Years since quitting: 29.2  Smokeless Tobacco Never Used     Counseling given: Not Answered   Clinical Intake:                       Past Medical History:  Diagnosis Date  . Anxiety   . Arthritis   . Bipolar 1 disorder ([place])   . Depression   . Fibromyalgia   . Glaucoma   . Menopausal state   . Obesity (BMI 30.0-34.9)   . Pneumonia    Past Surgical History:  Procedure Laterality Date  . APPENDECTOMY     Open appendectomy (as a child)  . BREAST SURGERY Bilateral implants and removal  . CHOLECYSTECTOMY N/A 10/21/2017   Procedure: LAPAROSCOPIC CHOLECYSTECTOMY POSSIBLE OPEN;  Surgeon: Vickie Epley, MD;  Location: [facility] ORS;  Service: General;  Laterality: N/A;  .  COLONOSCOPY WITH PROPOFOL N/A 07/25/2017   Procedure: COLONOSCOPY WITH PROPOFOL;  Surgeon: Jonathon Bellows, MD;  Location: [facility];  Service: Gastroenterology;  Laterality: N/A;  . FRACTURE SURGERY Right 2014   ankle  . SPINE SURGERY  L4-5   fusion and coil surgery (anterior approach)   Family History  Problem Relation Age of Onset  . Cancer Mother        colon  . Diabetes Mother   . Coronary artery disease Mother   . Parkinson's disease Father   . Kidney disease Neg Hx    Social History   Socioeconomic History  . Marital status: Married    Spouse name: Not on file  . Number of children: Not on file  . Years of education: cosmetology   . Highest education level: Some college, no degree  Occupational History  . Not on file  Social Needs  . Financial resource strain: Not hard at all  . Food insecurity:    Worry: Never true    Inability: Never true  . Transportation needs:    Medical: No    Non-medical: No  Tobacco Use  . Smoking status: Former Smoker    Last attempt to quit: 03/18/1989    Years since quitting: 29.2  . Smokeless tobacco: Never Used  Substance and Sexual Activity  . Alcohol use: Yes  Alcohol/week: 0.0 standard drinks    Comment: rarely  . Drug use: No  . Sexual activity: Not Currently  Lifestyle  . Physical activity:    Days per week: 0 days    Minutes per session: 0 min  . Stress: Not at all  Relationships  . Social connections:    Talks on phone: More than three times a week    Gets together: More than three times a week    Attends religious service: More than 4 times per year    Active member of club or organization: Yes    Attends meetings of clubs or organizations: More than 4 times per year    Relationship status: Married  Other Topics Concern  . Not on file  Social History Narrative  . Not on file    Outpatient Encounter Medications as of 06/11/2018  Medication Sig  . ALPRAZolam (XANAX) 1 MG tablet Take 1 tablet (1 mg total) by  mouth 3 (three) times daily as needed. for anxiety  . Cholecalciferol (VITAMIN D3) 2000 units capsule Take 1 capsule (2,000 Units total) by mouth daily.  . cyclobenzaprine (FLEXERIL) 5 MG tablet TAKE 1 TABLET(5 MG) BY MOUTH THREE TIMES DAILY  . Multiple Vitamin (MULTI-VITAMINS) TABS Take by mouth.  . temazepam (RESTORIL) 15 MG capsule TAKE ONE CAPSULE BY MOUTH NIGHTLY AS NEEDED FOR SLEEP   No facility-administered encounter medications on file as of 06/11/2018.     Activities of Daily Living In your present state of health, do you have any difficulty performing the following activities: 06/11/2018 10/20/2017  Hearing? Y N  Vision? N N  Difficulty concentrating or making decisions? N N  Walking or climbing stairs? N N  Dressing or bathing? N N  Doing errands, shopping? N N  Preparing Food and eating ? N -  Using the Toilet? N -  In the past six months, have you accidently leaked urine? N -  Do you have problems with loss of bowel control? N -  Managing your Medications? N -  Managing your Finances? N -  Housekeeping or managing your Housekeeping? N -  Some recent data might be hidden    Patient Care Team: Guadalupe Maple, MD as PCP - General (Family Medicine)    Assessment:   This is a routine wellness examination for Shequila.  Exercise Activities and Dietary recommendations Current Exercise Habits: The patient does not participate in regular exercise at present, Exercise limited by: None identified  Goals    . DIET - REDUCE SODIUM INTAKE     Decrease sodium intake, and increase water intake to 6-8 glasses of water a day     . Increase water intake     Recommend drinking at least 3-4 glasses of water a day        Fall Risk Fall Risk  06/11/2018 01/11/2018 10/25/2017 09/04/2017 06/28/2017  Falls in the past year? No No No No No  Number falls in past yr: - - - - -  Injury with Fall? - - - - -  Risk for fall due to : - - - - -  Follow up - - - - -   Is the patient's home  free of loose throw rugs in walkways, pet beds, electrical cords, etc?   yes      Grab bars in the bathroom? no      Handrails on the stairs?   no stairs       Adequate lighting?   yes  Timed Get Up and Go performed: Completed in 8 seconds with no use of assistive devices, steady gait. No intervention needed at this time.   Depression Screen PHQ 2/9 Scores 06/11/2018 10/25/2017 06/28/2017 06/07/2017  PHQ - 2 Score 0 0 0 0  PHQ- 9 Score - - - -  Exception Documentation - - - -     Cognitive Function     6CIT Screen 06/11/2018 06/07/2017  What Year? 0 points 0 points  What month? 0 points 0 points  What time? 0 points 0 points  Count back from 20 0 points 0 points  Months in reverse 0 points 0 points  Repeat phrase 0 points 0 points  Total Score 0 0    Immunization History  Administered Date(s) Administered  . Pneumococcal Conjugate-13 06/08/2016  . Td 12/01/2008  . Tdap 07/11/2016    Qualifies for Shingles Vaccine? No   Screening Tests Health Maintenance  Topic Date Due  . PNA vac Low Risk Adult (2 of 2 - PPSV23) 06/08/2017  . INFLUENZA VACCINE  05/17/2018  . MAMMOGRAM  03/13/2020  . COLONOSCOPY  07/25/2022  . TETANUS/TDAP  07/11/2026  . DEXA SCAN  Completed  . Hepatitis C Screening  Completed    Cancer Screenings: Lung: Low Dose CT Chest recommended if Age 72-80 years, 30 pack-year currently smoking OR have quit w/in 15years. Patient does not qualify. Breast:  Up to date on Mammogram? Yes  03/13/2018 Up to date of Bone Density/Dexa? Yes 11/22/2010 Colorectal: completed 07/25/2017  Additional Screenings:  Hepatitis C Screening: completed 07/25/2017     Plan:    I have personally reviewed and addressed the Medicare Annual Wellness questionnaire and have noted the following in the patient's chart:  A. Medical and social history B. Use of alcohol, tobacco or illicit drugs  C. Current medications and supplements D. Functional ability and status E.  Nutritional  status F.  Physical activity G. Advance directives H. List of other physicians I.  Hospitalizations, surgeries, and ER visits in previous 12 months J.  [place] such as hearing and vision if needed, cognitive and depression L. Referrals and appointments   In addition, I have reviewed and discussed with patient certain preventive protocols, quality metrics, and best practice recommendations. A written personalized care plan for preventive services as well as general preventive health recommendations were provided to patient.   Signed,  Tyler Aas, LPN Nurse Health Advisor   Nurse Notes:none

## 2018-06-11 NOTE — Patient Instructions (Signed)
Catherine Hughes , Thank you for taking time to come for your Medicare Wellness Visit. I appreciate your ongoing commitment to your health goals. Please review the following plan we discussed and let me know if I can assist you in the future.   Screening recommendations/referrals: Colonoscopy: completed 07/2017 Mammogram: completed 02/2018 Bone Density: completed  Recommended yearly ophthalmology/optometry visit for glaucoma screening and checkup Recommended yearly dental visit for hygiene and checkup  Vaccinations: Influenza vaccine: declined Pneumococcal vaccine: declined  Tdap vaccine: up to date  Shingles vaccine: not eligible   Advanced directives: Advance directive discussed with you today. I have provided a copy for you to complete at home and have notarized. Once this is complete please bring a copy in to our office so we can scan it into your chart.  Conditions/risks identified: Decrease sodium intake, and increase water intake to 6-8 glasses of water a day   Next appointment: Follow up in one year for your annual wellness exam.    Preventive Care 69 Years and Older, Female Preventive care refers to lifestyle choices and visits with your health care provider that can promote health and wellness. What does preventive care include?  A yearly physical exam. This is also called an annual well check.  Dental exams once or twice a year.  Routine eye exams. Ask your health care provider how often you should have your eyes checked.  Personal lifestyle choices, including:  Daily care of your teeth and gums.  Regular physical activity.  Eating a healthy diet.  Avoiding tobacco and drug use.  Limiting alcohol use.  Practicing safe sex.  Taking low-dose aspirin every day.  Taking vitamin and mineral supplements as recommended by your health care provider. What happens during an annual well check? The services and screenings done by your health care provider during your annual  well check will depend on your age, overall health, lifestyle risk factors, and family history of disease. Counseling  Your health care provider may ask you questions about your:  Alcohol use.  Tobacco use.  Drug use.  Emotional well-being.  Home and relationship well-being.  Sexual activity.  Eating habits.  History of falls.  Memory and ability to understand (cognition).  Work and work Statistician.  Reproductive health. Screening  You may have the following tests or measurements:  Height, weight, and BMI.  Blood pressure.  Lipid and cholesterol levels. These may be checked every 5 years, or more frequently if you are over 69 years old.  Skin check.  Lung cancer screening. You may have this screening every year starting at age 69 if you have a 30-pack-year history of smoking and currently smoke or have quit within the past 15 years.  Fecal occult blood test (FOBT) of the stool. You may have this test every year starting at age 69.  Flexible sigmoidoscopy or colonoscopy. You may have a sigmoidoscopy every 5 years or a colonoscopy every 10 years starting at age 69.  Hepatitis C blood test.  Hepatitis B blood test.  Sexually transmitted disease (STD) testing.  Diabetes screening. This is done by checking your blood sugar (glucose) after you have not eaten for a while (fasting). You may have this done every 1-3 years.  Bone density scan. This is done to screen for osteoporosis. You may have this done starting at age 69.  Mammogram. This may be done every 1-2 years. Talk to your health care provider about how often you should have regular mammograms. Talk with your health care  provider about your test results, treatment options, and if necessary, the need for more tests. Vaccines  Your health care provider may recommend certain vaccines, such as:  Influenza vaccine. This is recommended every year.  Tetanus, diphtheria, and acellular pertussis (Tdap, Td) vaccine.  You may need a Td booster every 10 years.  Zoster vaccine. You may need this after age 69.  Pneumococcal 13-valent conjugate (PCV13) vaccine. One dose is recommended after age 69.  Pneumococcal polysaccharide (PPSV23) vaccine. One dose is recommended after age 69. Talk to your health care provider about which screenings and vaccines you need and how often you need them. This information is not intended to replace advice given to you by your health care provider. Make sure you discuss any questions you have with your health care provider. Document Released: 10/30/2015 Document Revised: 06/22/2016 Document Reviewed: 08/04/2015 Elsevier Interactive Patient Education  2017 [place] Prevention in the Home Falls can cause injuries. They can happen to people of all ages. There are many things you can do to make your home safe and to help prevent falls. What can I do on the outside of my home?  Regularly fix the edges of walkways and driveways and fix any cracks.  Remove anything that might make you trip as you walk through a door, such as a raised step or threshold.  Trim any bushes or trees on the path to your home.  Use bright outdoor lighting.  Clear any walking paths of anything that might make someone trip, such as rocks or tools.  Regularly check to see if handrails are loose or broken. Make sure that both sides of any steps have handrails.  Any raised decks and porches should have guardrails on the edges.  Have any leaves, snow, or ice cleared regularly.  Use sand or salt on walking paths during winter.  Clean up any spills in your garage right away. This includes oil or grease spills. What can I do in the bathroom?  Use night lights.  Install grab bars by the toilet and in the tub and shower. Do not use towel bars as grab bars.  Use non-skid mats or decals in the tub or shower.  If you need to sit down in the shower, use a plastic, non-slip stool.  Keep the  floor dry. Clean up any water that spills on the floor as soon as it happens.  Remove soap buildup in the tub or shower regularly.  Attach bath mats securely with double-sided non-slip rug tape.  Do not have throw rugs and other things on the floor that can make you trip. What can I do in the bedroom?  Use night lights.  Make sure that you have a light by your bed that is easy to reach.  Do not use any sheets or blankets that are too big for your bed. They should not hang down onto the floor.  Have a firm chair that has side arms. You can use this for support while you get dressed.  Do not have throw rugs and other things on the floor that can make you trip. What can I do in the kitchen?  Clean up any spills right away.  Avoid walking on wet floors.  Keep items that you use a lot in easy-to-reach places.  If you need to reach something above you, use a strong step stool that has a grab bar.  Keep electrical cords out of the way.  Do not use floor polish  or wax that makes floors slippery. If you must use wax, use non-skid floor wax.  Do not have throw rugs and other things on the floor that can make you trip. What can I do with my stairs?  Do not leave any items on the stairs.  Make sure that there are handrails on both sides of the stairs and use them. Fix handrails that are broken or loose. Make sure that handrails are as long as the stairways.  Check any carpeting to make sure that it is firmly attached to the stairs. Fix any carpet that is loose or worn.  Avoid having throw rugs at the top or bottom of the stairs. If you do have throw rugs, attach them to the floor with carpet tape.  Make sure that you have a light switch at the top of the stairs and the bottom of the stairs. If you do not have them, ask someone to add them for you. What else can I do to help prevent falls?  Wear shoes that:  Do not have high heels.  Have rubber bottoms.  Are comfortable and fit  you well.  Are closed at the toe. Do not wear sandals.  If you use a stepladder:  Make sure that it is fully opened. Do not climb a closed stepladder.  Make sure that both sides of the stepladder are locked into place.  Ask someone to hold it for you, if possible.  Clearly mark and make sure that you can see:  Any grab bars or handrails.  First and last steps.  Where the edge of each step is.  Use tools that help you move around (mobility aids) if they are needed. These include:  Canes.  Walkers.  Scooters.  Crutches.  Turn on the lights when you go into a dark area. Replace any light bulbs as soon as they burn out.  Set up your furniture so you have a clear path. Avoid moving your furniture around.  If any of your floors are uneven, fix them.  If there are any pets around you, be aware of where they are.  Review your medicines with your doctor. Some medicines can make you feel dizzy. This can increase your chance of falling. Ask your doctor what other things that you can do to help prevent falls. This information is not intended to replace advice given to you by your health care provider. Make sure you discuss any questions you have with your health care provider. Document Released: 07/30/2009 Document Revised: 03/10/2016 Document Reviewed: 11/07/2014 Elsevier Interactive Patient Education  2017 [employer].

## 2018-06-16 ENCOUNTER — Other Ambulatory Visit: Payer: Self-pay | Admitting: Family Medicine

## 2018-06-18 NOTE — Telephone Encounter (Signed)
Your patient 

## 2018-06-28 ENCOUNTER — Ambulatory Visit: Payer: Medicare Other | Admitting: Psychiatry

## 2018-07-06 ENCOUNTER — Telehealth: Payer: Self-pay | Admitting: Family Medicine

## 2018-07-06 NOTE — Telephone Encounter (Signed)
Called pt to discuss why Dukes Magic mouthwash and current symptoms the pt is having at this time. Unable to leave message due to voicemail box not being set up.

## 2018-07-06 NOTE — Telephone Encounter (Signed)
Copied from [place] [phone]. Topic: Quick Communication - See Telephone Encounter >> Jul 06, 2018  3:01 PM Blase Mess A wrote: CRM for notification. See Telephone encounter for: 07/06/18. Patient is requesting a Prescribtion for Dukes Magic Mouthwash.  Is is open to something else.[id number] [address], [address] AT [facility] [address] [place] [address] Phone: [phone] Fax: [phone]  Please advise Patient call back [phone]

## 2018-07-09 NOTE — Telephone Encounter (Signed)
Needs appt, looks like this hasn't been given in 2 years

## 2018-07-09 NOTE — Telephone Encounter (Signed)
Left message on machine for pt to return call to the office. CRM updated.  

## 2018-07-10 ENCOUNTER — Telehealth: Payer: Self-pay | Admitting: Family Medicine

## 2018-07-10 ENCOUNTER — Other Ambulatory Visit: Payer: Self-pay

## 2018-07-10 ENCOUNTER — Encounter: Payer: Self-pay | Admitting: Family Medicine

## 2018-07-10 ENCOUNTER — Ambulatory Visit: Payer: Medicare Other | Admitting: Family Medicine

## 2018-07-10 VITALS — BP 130/80 | HR 87 | Temp 98.3°F | Ht 65.0 in | Wt 169.0 lb

## 2018-07-10 DIAGNOSIS — G4700 Insomnia, unspecified: Secondary | ICD-10-CM

## 2018-07-10 DIAGNOSIS — M62838 Other muscle spasm: Secondary | ICD-10-CM

## 2018-07-10 DIAGNOSIS — F411 Generalized anxiety disorder: Secondary | ICD-10-CM

## 2018-07-10 DIAGNOSIS — B370 Candidal stomatitis: Secondary | ICD-10-CM | POA: Diagnosis not present

## 2018-07-10 MED ORDER — ALPRAZOLAM 1 MG PO TABS: 1.0000 mg | ORAL_TABLET | Freq: Three times a day (TID) | ORAL | 0 refills | Status: DC | PRN

## 2018-07-10 MED ORDER — CYCLOBENZAPRINE HCL 5 MG PO TABS: ORAL_TABLET | ORAL | 0 refills | Status: DC

## 2018-07-10 MED ORDER — MAGIC MOUTHWASH: 5.0000 mL | Freq: Four times a day (QID) | ORAL | 0 refills | Status: DC

## 2018-07-10 MED ORDER — TEMAZEPAM 15 MG PO CAPS: ORAL_CAPSULE | ORAL | 0 refills | Status: DC

## 2018-07-10 NOTE — Telephone Encounter (Signed)
Rx faxed over

## 2018-07-10 NOTE — Assessment & Plan Note (Signed)
Stable on flexeril. Call with any concerns. Refills given.

## 2018-07-10 NOTE — Telephone Encounter (Signed)
Pt seen in office today.

## 2018-07-10 NOTE — Telephone Encounter (Signed)
RX called into pharmacy

## 2018-07-10 NOTE — Assessment & Plan Note (Signed)
Stable on current regimen. Due to be seen by psychiatry on Monday. Will leave her on same dose for right now. Rx sent to her pharmacy. Goal of tapering off once she is in therapy- will leave that to primary who she is seeing in 1 month. Strongly advised patient not to take xanax and temezepam at the same time.

## 2018-07-10 NOTE — Telephone Encounter (Signed)
Copied from [place] [phone]. Topic: General - Other >> Jul 10, 2018  4:08 PM Antonieta Iba C wrote: Reason for CRM: pt says that pharmacy didn't receive Rx for mouth wash and pt says that she need it desperately. Pt would like to know if office could re-send?

## 2018-07-10 NOTE — Progress Notes (Signed)
BP 130/80   Pulse 87   Temp 98.3 F (36.8 C) (Tympanic)   Ht 5\' 5"  (1.651 m)   Wt 169 lb (76.7 kg)   SpO2 97%   BMI 28.12 kg/m     Subjective:    Patient ID: Catherine Hughes, female    DOB: [birth year], 67 y.o.   MRN: [id number]  HPI: Catherine Hughes is a 67 y.o. female  Chief Complaint  Patient presents with  . Medication Refill    Xanax, Flexeril, temazepam/ Pt requests a new medication (D[facility] magic mouthwash)  . Anxiety  . Insomnia   Has been having issues with thrush when she was taking vicodin. Has been having issues with irritation and prickly in her mouth and having a white film on her tongue. She took a tylenol and she is allergic to it.   ANXIETY/STRESS- to see a psychiatrist next Monday. Planning to start weaning down on her medicine. Has been on medication for 20+ years. Was on significantly higher doses. She notes that she is going to see psychiatry and counseling. She notes that she has a lot of social stressors.  Duration:stable Anxious mood: yes  Excessive worrying: yes Irritability: yes  Sweating: no Nausea: no Palpitations:no Hyperventilation: no Panic attacks: yes- manages them with xanax Agoraphobia: no  Obscessions/compulsions: yes Depressed mood: yes Depression screen [employer] 2/9 07/10/2018 06/11/2018 10/25/2017 06/28/2017 06/07/2017  Decreased Interest 0 0 0 0 0  Down, Depressed, Hopeless 0 0 0 0 0  PHQ - 2 Score 0 0 0 0 0  Altered sleeping 0 - - - -  Tired, decreased energy 0 - - - -  Change in appetite 0 - - - -  Feeling bad or failure about yourself  0 - - - -  Trouble concentrating 0 - - - -  Moving slowly or fidgety/restless 0 - - - -  Suicidal thoughts 0 - - - -  PHQ-9 Score 0 - - - -  Difficult doing work/chores Not difficult at all - - - -   GAD 7 : Generalized Anxiety Score 07/10/2018 01/11/2018 02/13/2017  Nervous, Anxious, on Edge 1 3 1   Control/stop worrying 1 0 1  Worry too much - different things 1 1 1   Trouble relaxing 1 3 1     Restless 1 2 0  Easily annoyed or irritable 1 3 0  Afraid - awful might happen 0 1 0  Total GAD 7 Score 6 13 4   Anxiety Difficulty Not difficult at all Somewhat difficult -   Anhedonia: no Weight changes: no Insomnia: yes hard to fall asleep  Hypersomnia: no Fatigue/loss of energy: yes Feelings of worthlessness: no Feelings of guilt: no Impaired concentration/indecisiveness: no Suicidal ideations: no  Crying spells: no Recent Stressors/Life Changes: yes   Relationship problems: no   Family stress: yes     Financial stress: yes    Job stress: no    Recent death/loss: no  INSOMNIA Duration: chronic Satisfied with sleep quality: no Difficulty falling asleep: no Difficulty staying asleep: yes Waking a few hours after sleep onset: yes Early morning awakenings: no Daytime hypersomnolence: no Wakes feeling refreshed: yes Good sleep hygiene: no Apnea: no Snoring: no Depressed/anxious mood: yes Recent stress: yes Restless legs/nocturnal leg cramps: no Chronic pain/arthritis: yes History of sleep study: no Treatments attempted: melatonin, uinsom, benadryl and ambien   Relevant past medical, surgical, family and social history reviewed and updated as indicated. Interim medical history since our last visit reviewed. Allergies and  medications reviewed and updated.  Review of Systems  Constitutional: Negative.   Respiratory: Negative.   Cardiovascular: Negative.   Skin: Negative.   Psychiatric/Behavioral: Positive for dysphoric mood and sleep disturbance. Negative for agitation, behavioral problems, confusion, decreased concentration, hallucinations, self-injury and suicidal ideas. The patient is nervous/anxious. The patient is not hyperactive.     Per HPI unless specifically indicated above     Objective:    BP 130/80   Pulse 87   Temp 98.3 F (36.8 C) (Tympanic)   Ht 5\' 5"  (1.651 m)   Wt 169 lb (76.7 kg)   SpO2 97%   BMI 28.12 kg/m    Wt Readings from Last 3  Encounters:  07/10/18 169 lb (76.7 kg)  06/11/18 168 lb 14.4 oz (76.6 kg)  05/10/18 171 lb 6 oz (77.7 kg)    Physical Exam  Constitutional: She is oriented to person, place, and time. She appears well-developed and well-nourished. No distress.  HENT:  Head: Normocephalic and atraumatic.  Right Ear: Hearing normal.  Left Ear: Hearing normal.  Nose: Nose normal.  Mild thrush on tongue  Eyes: Conjunctivae and lids are normal. Right eye exhibits no discharge. Left eye exhibits no discharge. No scleral icterus.  Cardiovascular: Normal rate, regular rhythm, normal heart sounds and intact distal pulses. Exam reveals no gallop and no friction rub.  No murmur heard. Pulmonary/Chest: Effort normal and breath sounds normal. No stridor. No respiratory distress. She has no wheezes. She has no rales. She exhibits no tenderness.  Musculoskeletal: Normal range of motion.  Neurological: She is alert and oriented to person, place, and time.  Skin: Skin is warm, dry and intact. Capillary refill takes less than 2 seconds. No rash noted. She is not diaphoretic. No erythema. No pallor.  Psychiatric: She has a normal mood and affect. Her speech is normal and behavior is normal. Judgment and thought content normal. Cognition and memory are normal.  Nursing note and vitals reviewed.   Results for orders placed or performed in visit on 02/26/18  HM MAMMOGRAPHY  Result Value Ref Range   HM Mammogram 0-4 Bi-Rad 0-4 Bi-Rad, Self Reported Normal      Assessment & Plan:   Problem List Items Addressed This Visit      Other   Generalized anxiety disorder - Primary (Chronic)    Stable on current regimen. Due to be seen by psychiatry on Monday. Will leave her on same dose for right now. Rx sent to her pharmacy. Goal of tapering off once she is in therapy- will leave that to primary who she is seeing in 1 month. Strongly advised patient not to take xanax and temezepam at the same time.       Relevant Medications     ALPRAZolam (XANAX) 1 MG tablet   Other Relevant Orders   Ambulatory referral to Psychiatry   Insomnia    Stable on current regimen. Due to be seen by psychiatry on Monday. Will leave her on same dose for right now. Rx sent to her pharmacy. Goal of tapering off once she is in therapy- will leave that to primary who she is seeing in 1 month. Strongly advised patient not to take xanax and temezepam at the same time.       Relevant Orders   Ambulatory referral to Psychiatry   Cervical paraspinal muscle spasm    Stable on flexeril. Call with any concerns. Refills given.        Other Visit Diagnoses    Thrush  Will treat with magic mouth wash. Call with any concerns.    Relevant Medications   magic mouthwash SOLN       Follow up plan: Return As scheduled with primary.

## 2018-07-11 ENCOUNTER — Telehealth: Payer: Self-pay | Admitting: Family Medicine

## 2018-07-11 NOTE — Telephone Encounter (Signed)
Please give verbal for her to get [facility]'s magic mouthwash. Thanks.

## 2018-07-11 NOTE — Telephone Encounter (Signed)
Pharmacy notified.

## 2018-07-11 NOTE — Telephone Encounter (Signed)
Copied from [place] [id number]. Topic: Quick Communication - Rx Refill/Question >> Jul 11, 2018  8:52 AM Burchel, Abbi R wrote: Medication: [facility]'s Magic Mouthwash   Preferred Pharmacy: [id number] [address], [place] [address] [facility] [address] [place] [address] Phone: [phone] Fax: [phone]  Pt states that the rx the pharmacy received was for a similar medication, but will cost her $80 vs the $35 she normally pays for [facility].  Can Dr Wynetta Emery please switch the rx to [facility] per pt's request?

## 2018-07-16 ENCOUNTER — Ambulatory Visit: Payer: Medicare Other | Admitting: Psychiatry

## 2018-07-16 ENCOUNTER — Encounter: Payer: Self-pay | Admitting: Psychiatry

## 2018-07-16 ENCOUNTER — Other Ambulatory Visit: Payer: Self-pay

## 2018-07-16 VITALS — BP 128/75 | HR 76 | Temp 98.6°F | Wt 167.8 lb

## 2018-07-16 DIAGNOSIS — F1121 Opioid dependence, in remission: Secondary | ICD-10-CM

## 2018-07-16 DIAGNOSIS — F1320 Sedative, hypnotic or anxiolytic dependence, uncomplicated: Secondary | ICD-10-CM | POA: Diagnosis not present

## 2018-07-16 DIAGNOSIS — F411 Generalized anxiety disorder: Secondary | ICD-10-CM | POA: Diagnosis not present
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
## 2018-07-17 ENCOUNTER — Encounter: Payer: Self-pay | Admitting: Psychiatry

## 2018-08-06 ENCOUNTER — Encounter: Payer: Self-pay | Admitting: Family Medicine

## 2018-08-06 ENCOUNTER — Ambulatory Visit: Payer: Medicare Other | Admitting: Family Medicine

## 2018-08-06 DIAGNOSIS — F411 Generalized anxiety disorder: Secondary | ICD-10-CM

## 2018-08-06 DIAGNOSIS — F3341 Major depressive disorder, recurrent, in partial remission: Secondary | ICD-10-CM

## 2018-08-06 DIAGNOSIS — G4700 Insomnia, unspecified: Secondary | ICD-10-CM

## 2018-08-06 DIAGNOSIS — M797 Fibromyalgia: Secondary | ICD-10-CM

## 2018-08-06 MED ORDER — TEMAZEPAM 15 MG PO CAPS: ORAL_CAPSULE | ORAL | 0 refills | Status: DC

## 2018-08-06 MED ORDER — ALPRAZOLAM 1 MG PO TABS: 1.0000 mg | ORAL_TABLET | Freq: Three times a day (TID) | ORAL | 0 refills | Status: DC | PRN

## 2018-08-06 NOTE — Assessment & Plan Note (Signed)
Discuss fibromyalgia combined with anxiety causing many symptoms patient will struggle through and exercise and walk work on getting better sleep to help manage.

## 2018-08-06 NOTE — Assessment & Plan Note (Signed)
Patient doing stable does not want to go on any medications for now.

## 2018-08-06 NOTE — Assessment & Plan Note (Signed)
Discussed patient has a Xanax taper schedule patient will be checking again in 1 to 2 months we will bring her Xanax schedule with her for tapering.  In the meantime patient will need 1 more refill.

## 2018-08-06 NOTE — Progress Notes (Signed)
BP 98/67 (BP Location: Left Arm, Patient Position: Sitting, Cuff Size: Normal)   Pulse 71   Temp 98.6 F (37 C) (Oral)   Ht 5\' 5"  (1.651 m)   Wt 165 lb 14.4 oz (75.3 kg)   SpO2 97%   BMI 27.61 kg/m    Subjective:    Patient ID: Catherine Hughes, female    DOB: 1951-05-28, 67 y.o.   MRN: [id number]  HPI: GREG ECKRICH is a 67 y.o. female  Chief Complaint  Patient presents with  . Depression  . Anxiety  Patient follow-up has not a great deal of still depression anxiety nerve type issues going on has been with psychiatrist went through notes for details which was behind the break the glass note but patient expressly gave signed release for reviewing this information. Patient is tapering Xanax do not have her taper schedule and looks to be finished by the end of February.  We will then address her sleeping medications after the Xanax is stopped.  Patient has follow-up scheduled but not made with psychotherapist but has not been done yet due to time constraints. No follow-up is been scheduled with her psychiatrist.  Relevant past medical, surgical, family and social history reviewed and updated as indicated. Interim medical history since our last visit reviewed. Allergies and medications reviewed and updated.  Review of Systems  Constitutional: Negative.   Respiratory: Negative.   Cardiovascular: Negative.     Per HPI unless specifically indicated above     Objective:    BP 98/67 (BP Location: Left Arm, Patient Position: Sitting, Cuff Size: Normal)   Pulse 71   Temp 98.6 F (37 C) (Oral)   Ht 5\' 5"  (1.651 m)   Wt 165 lb 14.4 oz (75.3 kg)   SpO2 97%   BMI 27.61 kg/m   Wt Readings from Last 3 Encounters:  08/06/18 165 lb 14.4 oz (75.3 kg)  07/16/18 167 lb 12.8 oz (76.1 kg)  07/10/18 169 lb (76.7 kg)    Physical Exam  Constitutional: She is oriented to person, place, and time. She appears well-developed and well-nourished.  HENT:  Head: Normocephalic and  atraumatic.  Eyes: Conjunctivae and EOM are normal.  Neck: Normal range of motion.  Cardiovascular: Normal rate, regular rhythm and normal heart sounds.  Pulmonary/Chest: Effort normal and breath sounds normal.  Musculoskeletal: Normal range of motion.  Neurological: She is alert and oriented to person, place, and time.  Skin: No erythema.  Psychiatric: She has a normal mood and affect. Her behavior is normal. Judgment and thought content normal.    Results for orders placed or performed in visit on 02/26/18  HM MAMMOGRAPHY  Result Value Ref Range   HM Mammogram 0-4 Bi-Rad 0-4 Bi-Rad, Self Reported Normal      Assessment & Plan:   Problem List Items Addressed This Visit      Other   Fibromyalgia (Chronic)    Discuss fibromyalgia combined with anxiety causing many symptoms patient will struggle through and exercise and walk work on getting better sleep to help manage.      Generalized anxiety disorder (Chronic)    Discussed patient has a Xanax taper schedule patient will be checking again in 1 to 2 months we will bring her Xanax schedule with her for tapering.  In the meantime patient will need 1 more refill.      Relevant Medications   ALPRAZolam (XANAX) 1 MG tablet   Depression    Patient doing stable does not  want to go on any medications for now.      Relevant Medications   ALPRAZolam (XANAX) 1 MG tablet   Insomnia    Discussed with patient insomnia patients going to be tackling that second after getting off Xanax for now will stay on temazepam 15 mg at bedtime and will address with a 68-month taper starting the end of February.          Follow up plan: Return in about 2 months (around 10/06/2018).

## 2018-08-06 NOTE — Assessment & Plan Note (Signed)
Discussed with patient insomnia patients going to be tackling that second after getting off Xanax for now will stay on temazepam 15 mg at bedtime and will address with a 64-month taper starting the end of February.

## 2018-08-14 DIAGNOSIS — I83893 Varicose veins of bilateral lower extremities with other complications: Secondary | ICD-10-CM | POA: Diagnosis not present

## 2018-08-14 DIAGNOSIS — I890 Lymphedema, not elsewhere classified: Secondary | ICD-10-CM | POA: Insufficient documentation

## 2018-08-31 ENCOUNTER — Other Ambulatory Visit: Payer: Self-pay | Admitting: Family Medicine

## 2018-08-31 NOTE — Telephone Encounter (Signed)
Called pt to inquire about symptoms. Pt has place an RX request for Zovirax Pt states she is out of town and has had a break out of both conditions.  She is requesting Zovirax and she wants Dukes mouthwash. She says the dukes is cheaper that the magic mouthwash. Pt is requesting these be sent to:  [facility] and [address]

## 2018-09-03 ENCOUNTER — Telehealth: Payer: Self-pay | Admitting: Family Medicine

## 2018-09-03 MED ORDER — FIRST-DUKES MOUTHWASH MT SUSP: 5.0000 mL | Freq: Four times a day (QID) | OROMUCOSAL | 1 refills | Status: AC

## 2018-09-03 NOTE — Telephone Encounter (Signed)
Copied from [place] [phone]. Topic: General - Other >> Aug 31, 2018 11:12 AM Carolyn Stare wrote:  She is asking for this today   Pt call to ask for a RX for a mouth wash she said it was called [facility] and she does not need the kit. She also req refill on something for fever blisters She said she has a break out of thrush and blisters    Pharmacy   [place]

## 2018-09-06 ENCOUNTER — Ambulatory Visit: Payer: Medicare Other | Admitting: Family Medicine

## 2018-09-10 ENCOUNTER — Other Ambulatory Visit: Payer: Self-pay | Admitting: Family Medicine

## 2018-09-10 NOTE — Telephone Encounter (Signed)
[facility] called with a request for verbal order to substitute medication.Attempted to transfer call but pharmacict had disconnected.  [place] [facility] [place] aware and has number to call pharmacy.

## 2018-09-10 NOTE — Telephone Encounter (Signed)
Called pharmacy and clarified question of if magic mouth wash could be compounded. Verbal from Dr. Wynetta Emery given.   Routing this message to Dr. Jeananne Rama as this is a refill request for a different medication.

## 2018-09-11 NOTE — Telephone Encounter (Signed)
Requested medication (s) are due for refill today: yes  Requested medication (s) are on the active medication list: yes  Last refill:  07/10/18  Future visit scheduled: yes  Notes to clinic:  Not delegated    Requested Prescriptions  Pending Prescriptions Disp Refills   cyclobenzaprine (FLEXERIL) 5 MG tablet [Pharmacy Med Name: CYCLOBENZAPRINE 5MG  TABLETS] 90 tablet 0    Sig: TAKE 1 TABLET(5 MG) BY MOUTH THREE TIMES DAILY     Not Delegated - Analgesics:  Muscle Relaxants Failed - 09/10/2018  2:33 PM      Failed - This refill cannot be delegated      Passed - Valid encounter within last 6 months    Recent Outpatient Visits          1 month ago Insomnia, unspecified type   [facility] Guadalupe Maple, MD   2 months ago Generalized anxiety disorder   [place], Megan P, DO   4 months ago [place], Jeannette How, MD   8 months ago Acute sinusitis, recurrence not specified, unspecified location   [facility] [place], Jeannette How, MD   10 months ago Chest pain, unspecified type   [facility] [place], Jeannette How, MD      Future Appointments            Tomorrow [place], Jeannette How, MD [facility]

## 2018-09-12 ENCOUNTER — Encounter: Payer: Self-pay | Admitting: Family Medicine

## 2018-09-12 ENCOUNTER — Telehealth: Payer: Self-pay | Admitting: Family Medicine

## 2018-09-12 ENCOUNTER — Ambulatory Visit: Payer: Medicare Other | Admitting: Family Medicine

## 2018-09-12 DIAGNOSIS — M25562 Pain in left knee: Secondary | ICD-10-CM | POA: Diagnosis not present

## 2018-09-12 DIAGNOSIS — G8929 Other chronic pain: Secondary | ICD-10-CM | POA: Diagnosis not present

## 2018-09-12 DIAGNOSIS — M25571 Pain in right ankle and joints of right foot: Secondary | ICD-10-CM | POA: Insufficient documentation

## 2018-09-12 DIAGNOSIS — F419 Anxiety disorder, unspecified: Secondary | ICD-10-CM

## 2018-09-12 MED ORDER — ACYCLOVIR 5 % EX OINT: 1.0000 "application " | TOPICAL_OINTMENT | CUTANEOUS | 5 refills | Status: DC

## 2018-09-12 NOTE — Assessment & Plan Note (Signed)
Ongoing ankle pain ortho referal

## 2018-09-12 NOTE — Telephone Encounter (Signed)
[facility] the [facility] medication Acyclovir ointment 5% -5gram per patient.  She forgot to mention this to Dr Jeananne Rama.    [facility]  Thank you

## 2018-09-12 NOTE — Assessment & Plan Note (Signed)
Reviewed Xanax tapering schedule which patient has a very reasonable schedule of half doses and full doses that takes 17 weeks to accomplish.  This should be very reasonable schedule to work out which patient is starting January 1.

## 2018-09-12 NOTE — Progress Notes (Signed)
   BP 113/71 (BP Location: Left Arm, Patient Position: Sitting, Cuff Size: Normal)   Pulse 85   Temp 98.1 F (36.7 C) (Oral)   Ht 5\' 5"  (1.651 m)   Wt 171 lb 6.4 oz (77.7 kg)   SpO2 98%   BMI 28.52 kg/m    Subjective:    Patient ID: Catherine Hughes, female    DOB: 08-09-1951, 67 y.o.   MRN: [id number]  HPI: Catherine Hughes is a 67 y.o. female  Chief Complaint  Patient presents with  . Medication Management    Needs different quanity on Rx of Zovirax for price.   . Knee Pain  . Foot Pain  Patient follow-up having left knee pain discomfort has been ongoing pretty much all fall no clicking locking giving way does swell from time to time and is been painful.  Has not tried any over-the-counter or prescription medicine. Patient also has right ankle pain discomfort some swelling is noticed also.  This ankle was broken in the distant past.  Relevant past medical, surgical, family and social history reviewed and updated as indicated. Interim medical history since our last visit reviewed. Allergies and medications reviewed and updated.  Review of Systems  Constitutional: Negative.   Respiratory: Negative.   Cardiovascular: Negative.     Per HPI unless specifically indicated above     Objective:    BP 113/71 (BP Location: Left Arm, Patient Position: Sitting, Cuff Size: Normal)   Pulse 85   Temp 98.1 F (36.7 C) (Oral)   Ht 5\' 5"  (1.651 m)   Wt 171 lb 6.4 oz (77.7 kg)   SpO2 98%   BMI 28.52 kg/m   Wt Readings from Last 3 Encounters:  09/12/18 171 lb 6.4 oz (77.7 kg)  08/06/18 165 lb 14.4 oz (75.3 kg)  07/10/18 169 lb (76.7 kg)    Physical Exam  Constitutional: She is oriented to person, place, and time. She appears well-developed and well-nourished.  HENT:  Head: Normocephalic and atraumatic.  Eyes: Conjunctivae and EOM are normal.  Neck: Normal range of motion.  Cardiovascular: Normal rate, regular rhythm and normal heart sounds.  Pulmonary/Chest: Effort normal  and breath sounds normal.  Musculoskeletal: Normal range of motion.  Neurological: She is alert and oriented to person, place, and time.  Skin: No erythema.  Psychiatric: She has a normal mood and affect. Her behavior is normal. Judgment and thought content normal.    Results for orders placed or performed in visit on 02/26/18  HM MAMMOGRAPHY  Result Value Ref Range   HM Mammogram 0-4 Bi-Rad 0-4 Bi-Rad, Self Reported Normal      Assessment & Plan:   Problem List Items Addressed This Visit      Other   Anxiety    Reviewed Xanax tapering schedule which patient has a very reasonable schedule of half doses and full doses that takes 17 weeks to accomplish.  This should be very reasonable schedule to work out which patient is starting January 1.      Knee pain, left    Non specific knee pain Ortho referral      Relevant Orders   Ambulatory referral to Orthopedic Surgery   Ankle pain, right    Ongoing ankle pain ortho referal      Relevant Orders   Ambulatory referral to Orthopedic Surgery       Follow up plan: Return in about 3 months (around 12/13/2018).

## 2018-09-12 NOTE — Assessment & Plan Note (Addendum)
Non specific knee pain Ortho referral

## 2018-09-18 ENCOUNTER — Other Ambulatory Visit: Payer: Self-pay | Admitting: Family Medicine

## 2018-09-19 NOTE — Telephone Encounter (Signed)
Requested Prescriptions  Pending Prescriptions Disp Refills  . acyclovir ointment (ZOVIRAX) 5 % [Pharmacy Med Name: ACYCLOVIR 5% OINTMENT 5GM] 5 g 0    Sig: APPLY TOPICALLY AS DIRECTED TO THE AFFECTED AREA EVERY 3 HOURS     Antimicrobials:  Antiviral Agents - Anti-Herpetic Passed - 09/18/2018  8:28 PM      Passed - Valid encounter within last 12 months    Recent Outpatient Visits          1 week ago [place], Jeannette How, MD   1 month ago Insomnia, unspecified type   [facility] [place], Jeannette How, MD   2 months ago Generalized anxiety disorder   [facility] [place], Megan P, DO   4 months ago [place], Jeannette How, MD   8 months ago Acute sinusitis, recurrence not specified, unspecified location   [facility] [place], Jeannette How, MD      Future Appointments            In 2 months [place], Jeannette How, MD [facility], [facility]

## 2018-10-03 DIAGNOSIS — M25572 Pain in left ankle and joints of left foot: Secondary | ICD-10-CM | POA: Diagnosis not present

## 2018-10-03 DIAGNOSIS — M2241 Chondromalacia patellae, right knee: Secondary | ICD-10-CM | POA: Diagnosis not present

## 2018-10-03 DIAGNOSIS — M2242 Chondromalacia patellae, left knee: Secondary | ICD-10-CM | POA: Diagnosis not present

## 2018-10-03 DIAGNOSIS — M25571 Pain in right ankle and joints of right foot: Secondary | ICD-10-CM | POA: Diagnosis not present

## 2018-10-03 DIAGNOSIS — M179 Osteoarthritis of knee, unspecified: Secondary | ICD-10-CM | POA: Diagnosis not present

## 2018-10-11 ENCOUNTER — Other Ambulatory Visit: Payer: Self-pay | Admitting: Family Medicine

## 2018-10-11 NOTE — Telephone Encounter (Signed)
Requested medication (s) are due for refill today: yes  Requested medication (s) are on the active medication list: yes    Last refill: 09/12/18 #90 0 refills  Future visit scheduled yes  11/29/2018  Dr. Jeananne Rama  Notes to clinic:not delegated  Requested Prescriptions  Pending Prescriptions Disp Refills   cyclobenzaprine (FLEXERIL) 5 MG tablet [Pharmacy Med Name: CYCLOBENZAPRINE 5MG  TABLETS] 90 tablet 0    Sig: TAKE 1 TABLET(5 MG) BY MOUTH THREE TIMES DAILY     Not Delegated - Analgesics:  Muscle Relaxants Failed - 10/11/2018  2:45 PM      Failed - This refill cannot be delegated      Passed - Valid encounter within last 6 months    Recent Outpatient Visits          4 weeks ago [place], Jeannette How, MD   2 months ago Insomnia, unspecified type   [facility] [place], Jeannette How, MD   3 months ago Generalized anxiety disorder   [place], Megan P, DO   5 months ago [place], Jeannette How, MD   9 months ago Acute sinusitis, recurrence not specified, unspecified location   [facility] [place], Jeannette How, MD      Future Appointments            In 1 month [place], Jeannette How, MD [facility], [facility]

## 2018-10-11 NOTE — Telephone Encounter (Signed)
Requested medication (s) are due for refill today: yes  Requested medication (s) are on the active medication list: yes    Last refill: 08/06/18 #90 0 refills  Future visit scheduled yes 11/29/2018  Dr. Jeananne Rama  Notes to clinic:not delegated  Requested Prescriptions  Pending Prescriptions Disp Refills   ALPRAZolam (XANAX) 1 MG tablet [Pharmacy Med Name: ALPRAZOLAM 1MG  TABLETS] 90 tablet     Sig: TAKE 1 TABLET(1 MG) BY MOUTH THREE TIMES DAILY AS NEEDED FOR ANXIETY     Not Delegated - Psychiatry:  Anxiolytics/Hypnotics Failed - 10/11/2018  2:45 PM      Failed - This refill cannot be delegated      Failed - Urine Drug Screen completed in last 360 days.      Passed - Valid encounter within last 6 months    Recent Outpatient Visits          4 weeks ago [place], Jeannette How, MD   2 months ago Insomnia, unspecified type   [place], Jeannette How, MD   3 months ago Generalized anxiety disorder   [facility] [place], Megan P, DO   5 months ago [place], Jeannette How, MD   9 months ago Acute sinusitis, recurrence not specified, unspecified location   [facility] [place], Jeannette How, MD      Future Appointments            In 1 month [place], Jeannette How, MD [facility], [facility]

## 2018-10-19 DIAGNOSIS — M25571 Pain in right ankle and joints of right foot: Secondary | ICD-10-CM | POA: Diagnosis not present

## 2018-10-19 DIAGNOSIS — M6281 Muscle weakness (generalized): Secondary | ICD-10-CM | POA: Diagnosis not present

## 2018-10-19 DIAGNOSIS — M25561 Pain in right knee: Secondary | ICD-10-CM | POA: Diagnosis not present

## 2018-10-19 DIAGNOSIS — M25562 Pain in left knee: Secondary | ICD-10-CM | POA: Diagnosis not present

## 2018-10-26 DIAGNOSIS — M25571 Pain in right ankle and joints of right foot: Secondary | ICD-10-CM | POA: Diagnosis not present

## 2018-10-26 DIAGNOSIS — M25562 Pain in left knee: Secondary | ICD-10-CM | POA: Diagnosis not present

## 2018-10-26 DIAGNOSIS — M6281 Muscle weakness (generalized): Secondary | ICD-10-CM | POA: Diagnosis not present

## 2018-10-26 DIAGNOSIS — M25561 Pain in right knee: Secondary | ICD-10-CM | POA: Diagnosis not present

## 2018-11-02 DIAGNOSIS — M6281 Muscle weakness (generalized): Secondary | ICD-10-CM | POA: Diagnosis not present

## 2018-11-02 DIAGNOSIS — M25571 Pain in right ankle and joints of right foot: Secondary | ICD-10-CM | POA: Diagnosis not present

## 2018-11-02 DIAGNOSIS — M25561 Pain in right knee: Secondary | ICD-10-CM | POA: Diagnosis not present

## 2018-11-02 DIAGNOSIS — M25562 Pain in left knee: Secondary | ICD-10-CM | POA: Diagnosis not present

## 2018-11-28 ENCOUNTER — Ambulatory Visit (INDEPENDENT_AMBULATORY_CARE_PROVIDER_SITE_OTHER): Payer: Medicare Other | Admitting: Family Medicine

## 2018-11-28 ENCOUNTER — Encounter: Payer: Self-pay | Admitting: Family Medicine

## 2018-11-28 DIAGNOSIS — I248 Other forms of acute ischemic heart disease: Secondary | ICD-10-CM | POA: Diagnosis not present

## 2018-11-28 DIAGNOSIS — G894 Chronic pain syndrome: Secondary | ICD-10-CM

## 2018-11-28 DIAGNOSIS — G4700 Insomnia, unspecified: Secondary | ICD-10-CM

## 2018-11-28 MED ORDER — ALPRAZOLAM 1 MG PO TABS: 1.0000 mg | ORAL_TABLET | Freq: Every evening | ORAL | 1 refills | Status: DC | PRN

## 2018-11-28 MED ORDER — TEMAZEPAM 15 MG PO CAPS: ORAL_CAPSULE | ORAL | 1 refills | Status: DC

## 2018-11-28 NOTE — Assessment & Plan Note (Signed)
Discussed and will try zostrix

## 2018-11-28 NOTE — Addendum Note (Signed)
Addended by: Golden Pop A on: 11/28/2018 03:33 PM   Modules accepted: Orders

## 2018-11-28 NOTE — Assessment & Plan Note (Signed)
Stable no current sx

## 2018-11-28 NOTE — Progress Notes (Signed)
   BP 135/79   Pulse 82   Temp 98.4 F (36.9 C) (Oral)   Wt 174 lb (78.9 kg)   SpO2 98%   BMI 28.96 kg/m    Subjective:    Patient ID: Catherine Hughes, female    DOB: November 13, 1950, 68 y.o.   MRN: [id number]  HPI: Catherine Hughes is a 68 y.o. female  Chief Complaint  Patient presents with  . Depression  . Allergy Testing    pt states she wants to discuss getting allergy testing done   Patient concerned about allergies has allergy symptoms of itching that breaks out along her feet and up into her ears and throat and head area goes away with taking the Benadryl. Patient has these itching episodes approximately twice a month.  Has not really tried recently Allegra Claritin or other nonsedating antihistamines.  Working on Xanax reduction which seems to be going pretty easily. Doing knee exercises which seem to be helping her knees and chronic knee pain.  Relevant past medical, surgical, family and social history reviewed and updated as indicated. Interim medical history since our last visit reviewed. Allergies and medications reviewed and updated.  Review of Systems  Constitutional: Negative.   Respiratory: Negative.   Cardiovascular: Negative.     Per HPI unless specifically indicated above     Objective:    BP 135/79   Pulse 82   Temp 98.4 F (36.9 C) (Oral)   Wt 174 lb (78.9 kg)   SpO2 98%   BMI 28.96 kg/m   Wt Readings from Last 3 Encounters:  11/28/18 174 lb (78.9 kg)  09/12/18 171 lb 6.4 oz (77.7 kg)  08/06/18 165 lb 14.4 oz (75.3 kg)    Physical Exam Constitutional:      Appearance: She is well-developed.  HENT:     Head: Normocephalic and atraumatic.  Eyes:     Conjunctiva/sclera: Conjunctivae normal.  Neck:     Musculoskeletal: Normal range of motion.  Cardiovascular:     Rate and Rhythm: Normal rate and regular rhythm.     Heart sounds: Normal heart sounds.  Pulmonary:     Effort: Pulmonary effort is normal.     Breath sounds: Normal breath  sounds.  Musculoskeletal: Normal range of motion.  Skin:    Findings: No erythema.  Neurological:     Mental Status: She is alert and oriented to person, place, and time.  Psychiatric:        Behavior: Behavior normal.        Thought Content: Thought content normal.        Judgment: Judgment normal.     Results for orders placed or performed in visit on 02/26/18  HM MAMMOGRAPHY  Result Value Ref Range   HM Mammogram 0-4 Bi-Rad 0-4 Bi-Rad, Self Reported Normal      Assessment & Plan:   Problem List Items Addressed This Visit      Cardiovascular and Mediastinum   Demand ischemia ([place])    Stable no current sx        Other   Chronic pain syndrome (Chronic)    Discussed and will try zostrix      Insomnia    Continued sx          Follow up plan: Return if symptoms worsen or fail to improve, for As scheduled.

## 2018-11-28 NOTE — Assessment & Plan Note (Signed)
Continued sx

## 2018-11-29 ENCOUNTER — Ambulatory Visit: Payer: Medicare Other | Admitting: Family Medicine

## 2018-12-07 DIAGNOSIS — M545 Low back pain: Secondary | ICD-10-CM | POA: Diagnosis not present

## 2018-12-10 ENCOUNTER — Other Ambulatory Visit: Payer: Self-pay | Admitting: Family Medicine

## 2018-12-10 DIAGNOSIS — M545 Low back pain, unspecified: Secondary | ICD-10-CM | POA: Insufficient documentation

## 2018-12-10 NOTE — Telephone Encounter (Signed)
Requested medication (s) are due for refill today: yes  Requested medication (s) are on the active medication list: yes    Last refill: 10/12/2018  #90  0 refills  Future visit scheduled no  Notes to clinic:not delegated  Requested Prescriptions  Pending Prescriptions Disp Refills   cyclobenzaprine (FLEXERIL) 5 MG tablet [Pharmacy Med Name: CYCLOBENZAPRINE 5MG  TABLETS] 90 tablet 0    Sig: TAKE 1 TABLET(5 MG) BY MOUTH THREE TIMES DAILY     Not Delegated - Analgesics:  Muscle Relaxants Failed - 12/10/2018  3:36 PM      Failed - This refill cannot be delegated      Passed - Valid encounter within last 6 months    Recent Outpatient Visits          1 week ago Demand ischemia [facility])   [facility] [place], Jeannette How, MD   2 months ago [place], MD   4 months ago Insomnia, unspecified type   [facility] [place], Jeannette How, MD   5 months ago Generalized anxiety disorder   [place], [place], DO   7 months ago Anxiety   [facility] [place], Jeannette How, MD

## 2018-12-11 ENCOUNTER — Telehealth: Payer: Self-pay

## 2018-12-11 NOTE — Telephone Encounter (Signed)
Attempted PA for Alprazolam. Cover My Meds came back saying that this medication was on the list of covered drugs and PA was not required at this time. Faxed form back to pharmacy letting them know.

## 2018-12-18 DIAGNOSIS — Z87891 Personal history of nicotine dependence: Secondary | ICD-10-CM | POA: Diagnosis not present

## 2018-12-18 DIAGNOSIS — R079 Chest pain, unspecified: Secondary | ICD-10-CM | POA: Diagnosis not present

## 2018-12-18 DIAGNOSIS — K8010 Calculus of gallbladder with chronic cholecystitis without obstruction: Secondary | ICD-10-CM | POA: Diagnosis not present

## 2018-12-18 DIAGNOSIS — K439 Ventral hernia without obstruction or gangrene: Secondary | ICD-10-CM | POA: Diagnosis not present

## 2018-12-18 DIAGNOSIS — K5730 Diverticulosis of large intestine without perforation or abscess without bleeding: Secondary | ICD-10-CM | POA: Diagnosis not present

## 2018-12-19 ENCOUNTER — Other Ambulatory Visit: Payer: Self-pay | Admitting: Orthopedic Surgery

## 2018-12-19 DIAGNOSIS — M545 Low back pain, unspecified: Secondary | ICD-10-CM

## 2018-12-24 ENCOUNTER — Ambulatory Visit: Payer: Medicare Other

## 2019-01-04 ENCOUNTER — Other Ambulatory Visit: Payer: Self-pay | Admitting: Family Medicine

## 2019-01-16 ENCOUNTER — Telehealth: Payer: Self-pay | Admitting: Family Medicine

## 2019-01-16 NOTE — Telephone Encounter (Signed)
Please see message and advise.  Thank you. ° °

## 2019-01-16 NOTE — Telephone Encounter (Signed)
Copied from [place] [id number]. Topic: Quick Communication - See Telephone Encounter >> Jan 16, 2019  4:13 PM Berneta Levins wrote: CRM for notification. See Telephone encounter for: 01/16/19.  Lattie Haw with [facility] called and left message on [place] 01/15/2019.  States she needs to make sure that the doctor received CT abd/pelvis dated 12/18/2018 from [place] and that there were abnormal findings of trace fluid and pt needed a follow up ultrasound.   Lattie Haw can be reached at [phone].

## 2019-01-17 ENCOUNTER — Other Ambulatory Visit: Payer: Self-pay

## 2019-01-17 ENCOUNTER — Encounter: Payer: Self-pay | Admitting: Family Medicine

## 2019-01-17 ENCOUNTER — Ambulatory Visit (INDEPENDENT_AMBULATORY_CARE_PROVIDER_SITE_OTHER): Payer: Medicare Other | Admitting: Family Medicine

## 2019-01-17 DIAGNOSIS — G894 Chronic pain syndrome: Secondary | ICD-10-CM

## 2019-01-17 DIAGNOSIS — M797 Fibromyalgia: Secondary | ICD-10-CM | POA: Diagnosis not present

## 2019-01-17 DIAGNOSIS — G4700 Insomnia, unspecified: Secondary | ICD-10-CM | POA: Diagnosis not present

## 2019-01-17 DIAGNOSIS — I248 Other forms of acute ischemic heart disease: Secondary | ICD-10-CM

## 2019-01-17 DIAGNOSIS — F411 Generalized anxiety disorder: Secondary | ICD-10-CM

## 2019-01-17 MED ORDER — CYCLOBENZAPRINE HCL 5 MG PO TABS: 5.0000 mg | ORAL_TABLET | Freq: Three times a day (TID) | ORAL | 1 refills | Status: DC

## 2019-01-17 MED ORDER — TEMAZEPAM 15 MG PO CAPS: ORAL_CAPSULE | ORAL | 1 refills | Status: DC

## 2019-01-17 MED ORDER — ALPRAZOLAM 1 MG PO TABS: 1.0000 mg | ORAL_TABLET | Freq: Every evening | ORAL | 1 refills | Status: DC | PRN

## 2019-01-17 NOTE — Assessment & Plan Note (Signed)
Stable for now

## 2019-01-17 NOTE — Telephone Encounter (Signed)
Pt called in stating that she missed a phone call from [place]. Advised on schedule today will receive telephone call.

## 2019-01-17 NOTE — Assessment & Plan Note (Signed)
Discussed anxiety care and treatment will abandon Xanax taper at this time and given a 90-day prescription because of difficulties getting out and about and COVID-19 restrictions.

## 2019-01-17 NOTE — Progress Notes (Signed)
   There were no vitals taken for this visit.   Subjective:    Patient ID: AKSHITHA CULMER, female    DOB: [birth year], 68 y.o.   MRN: [id number]  HPI: REVECCA NACHTIGAL is a 68 y.o. female Medicine concerns Telemedicine using audio and telecommunications for a synchronous communication visit. Today's visit due to COVID-19 isolation precautions I connected with Rejeana Fadness and verified that I am speaking with the correct person using two identifiers.   I discussed the limitations, risks, security and privacy concerns of performing an evaluation and management service by telephone and the availability of in person appointments. I also discussed with the patient that there may be a patient responsible charge related to this service. The patient expressed understanding and agreed to proceed. The patient's location is home. I am at home.   Discussed with patient COVID-19 risk and precautions.  Patient relates she is being very cautious has granddaughter that lives with her son was wanting to move then but due to his potential exposures they have refused him moving in. This and many other stresses I have increased the patient's anxiety greatly and is taking Xanax 3 times a day.  Taking Restoril 15 mg most nights.  Using Flexeril 5 mg for her back 1-3 times a day. Is getting by with her back Chronic anxiety and taper of Xanax is unsuccessful at this time.  Relevant past medical, surgical, family and social history reviewed and updated as indicated. Interim medical history since our last visit reviewed. Allergies and medications reviewed and updated.  Review of Systems  Constitutional: Negative.   Respiratory: Negative.   Cardiovascular: Negative.     Per HPI unless specifically indicated above     Objective:    There were no vitals taken for this visit.  Wt Readings from Last 3 Encounters:  11/28/18 174 lb (78.9 kg)  09/12/18 171 lb 6.4 oz (77.7 kg)  08/06/18 165 lb 14.4 oz (75.3  kg)    Physical Exam none Results for orders placed or performed in visit on 02/26/18  HM MAMMOGRAPHY  Result Value Ref Range   HM Mammogram 0-4 Bi-Rad 0-4 Bi-Rad, Self Reported Normal      Assessment & Plan:   Problem List Items Addressed This Visit      Cardiovascular and Mediastinum   Demand ischemia ([place])    The current medical regimen is effective;  continue present plan and medications.         Other   Chronic pain syndrome (Chronic)    Stable for now      Relevant Medications   cyclobenzaprine (FLEXERIL) 5 MG tablet   Fibromyalgia (Chronic)    The current medical regimen is effective;  continue present plan and medications.       Relevant Medications   cyclobenzaprine (FLEXERIL) 5 MG tablet   Generalized anxiety disorder (Chronic)    Discussed anxiety care and treatment will abandon Xanax taper at this time and given a 90-day prescription because of difficulties getting out and about and COVID-19 restrictions.      Relevant Medications   ALPRAZolam (XANAX) 1 MG tablet   Insomnia    Discussed insomnia care and treatment will continue Restoril          Follow up plan: Return if symptoms worsen or fail to improve, for As scheduled.

## 2019-01-17 NOTE — Assessment & Plan Note (Signed)
Discussed insomnia care and treatment will continue Restoril

## 2019-01-17 NOTE — Telephone Encounter (Signed)
Added to providers schedule.

## 2019-01-17 NOTE — Assessment & Plan Note (Signed)
The current medical regimen is effective;  continue present plan and medications.  

## 2019-01-17 NOTE — Telephone Encounter (Signed)
Call pt 

## 2019-02-04 ENCOUNTER — Other Ambulatory Visit: Payer: Self-pay | Admitting: Family Medicine

## 2019-02-04 NOTE — Telephone Encounter (Signed)
[facility] needs the RX for taking 3 times a day,

## 2019-02-04 NOTE — Telephone Encounter (Signed)
Requested medication (s) are due for refill today: no  Requested medication (s) are on the active medication list: yes  Last refill: 02/04/2019   Future visit scheduled: No  Notes to clinic:not delegated   Requested Prescriptions  Pending Prescriptions Disp Refills   ALPRAZolam (XANAX) 1 MG tablet 270 tablet 1    Sig: Take 1 tablet (1 mg total) by mouth at bedtime as needed for anxiety.     Not Delegated - Psychiatry:  Anxiolytics/Hypnotics Failed - 02/04/2019  1:26 PM      Failed - This refill cannot be delegated      Failed - Urine Drug Screen completed in last 360 days.      Passed - Valid encounter within last 6 months    Recent Outpatient Visits          2 weeks ago Demand ischemia [facility])   [facility] [place], Jeannette How, MD   2 months ago Demand ischemia [facility])   [facility] [place], Jeannette How, MD   4 months ago [place], Jeannette How, MD   6 months ago Insomnia, unspecified type   [place], Jeannette How, MD   6 months ago Generalized anxiety disorder   [place], [place], DO

## 2019-02-05 ENCOUNTER — Other Ambulatory Visit: Payer: Self-pay | Admitting: Family Medicine

## 2019-02-05 MED ORDER — ALPRAZOLAM 1 MG PO TABS: 1.0000 mg | ORAL_TABLET | Freq: Three times a day (TID) | ORAL | 1 refills | Status: DC | PRN

## 2019-03-29 ENCOUNTER — Telehealth: Payer: Self-pay | Admitting: Family Medicine

## 2019-03-29 NOTE — Chronic Care Management (AMB) (Signed)
°  Chronic Care Management   Outreach Note  03/29/2019 Name: Catherine Hughes MRN: [id number] DOB: Mar 13, 1951  Referred by: Guadalupe Maple, MD Reason for referral : No chief complaint on file.   An unsuccessful telephone outreach was attempted today. The patient was referred to the case management team by for assistance with chronic care management and care coordination.   Follow Up Plan: A HIPPA compliant phone message was left for the patient providing contact information and requesting a return call.  The care management team will reach out to the patient again over the next 7 days.  If patient returns call to provider office, please advise to call [place] at [place]  [email or web address]   ??[phone]

## 2019-04-04 NOTE — Chronic Care Management (AMB) (Signed)
°  Chronic Care Management   Outreach Note  04/04/2019 Name: Catherine Hughes MRN: [id number] DOB: 1951-09-03  Referred by: Guadalupe Maple, MD Reason for referral : Chronic Care Management (Initial CCM outreach) and Chronic Care Management (Second CCM outreach was unsuccessful.)   A second unsuccessful telephone outreach was attempted today. The patient was referred to the case management team for assistance with chronic care management and care coordination.   Follow Up Plan: A HIPPA compliant phone message was left for the patient providing contact information and requesting a return call.  The care management team will reach out to the patient again over the next 7 days.  If patient returns call to provider office, please advise to call [place] at [place]  [email or web address]   ??[phone]

## 2019-04-11 NOTE — Chronic Care Management (AMB) (Signed)
°  Chronic Care Management   Outreach Note  04/11/2019 Name: Catherine Hughes MRN: [id number] DOB: [birth year]  Referred by: Guadalupe Maple, MD Reason for referral : Chronic Care Management (Initial CCM outreach), Chronic Care Management (Second CCM outreach was unsuccessful.), and Chronic Care Management (Third CCM outreach was unsuccessful)   Third unsuccessful telephone outreach was attempted today. The patient was referred to the case management team for assistance with chronic care management and care coordination. The patient's primary care provider has been notified of our unsuccessful attempts to make or maintain contact with the patient. The care management team is pleased to engage with this patient at any time in the future should he/she be interested in assistance from the care management team.   Follow Up Plan: The care management team is available to follow up with the patient after provider conversation with the patient regarding recommendation for care management engagement and subsequent re-referral to the care management team.   [place]  [email or web address]   ??[phone]

## 2019-05-14 ENCOUNTER — Telehealth: Payer: Medicare Other

## 2019-06-06 ENCOUNTER — Other Ambulatory Visit: Payer: Self-pay

## 2019-06-06 ENCOUNTER — Encounter: Payer: Self-pay | Admitting: Family Medicine

## 2019-06-06 ENCOUNTER — Ambulatory Visit (INDEPENDENT_AMBULATORY_CARE_PROVIDER_SITE_OTHER): Payer: Medicare Other | Admitting: Family Medicine

## 2019-06-06 DIAGNOSIS — D72829 Elevated white blood cell count, unspecified: Secondary | ICD-10-CM

## 2019-06-06 DIAGNOSIS — I248 Other forms of acute ischemic heart disease: Secondary | ICD-10-CM

## 2019-06-06 DIAGNOSIS — R5382 Chronic fatigue, unspecified: Secondary | ICD-10-CM

## 2019-06-06 DIAGNOSIS — R7303 Prediabetes: Secondary | ICD-10-CM

## 2019-06-06 DIAGNOSIS — E559 Vitamin D deficiency, unspecified: Secondary | ICD-10-CM

## 2019-06-06 NOTE — Assessment & Plan Note (Signed)
Checking blood work

## 2019-06-06 NOTE — Progress Notes (Signed)
   There were no vitals taken for this visit.   Subjective:    Patient ID: Catherine Hughes, female    DOB: 1951-01-22, 68 y.o.   MRN: [id number]  HPI: Catherine Hughes is a 68 y.o. female  Med check Patient with ongoing problems with nonspecific generalized itching takes Benadryl seems to help is concerned about possibility of diabetes or other medical type illnesses.  Reviewed patient's concerns and history of problems.  As a consequence will check blood work to further evaluate.   Relevant past medical, surgical, family and social history reviewed and updated as indicated. Interim medical history since our last visit reviewed. Allergies and medications reviewed and updated.  Review of Systems  Constitutional: Negative.   Respiratory: Negative.   Cardiovascular: Negative.     Per HPI unless specifically indicated above     Objective:    There were no vitals taken for this visit.  Wt Readings from Last 3 Encounters:  11/28/18 174 lb (78.9 kg)  09/12/18 171 lb 6.4 oz (77.7 kg)  08/06/18 165 lb 14.4 oz (75.3 kg)    Physical Exam  Results for orders placed or performed in visit on 02/26/18  HM MAMMOGRAPHY  Result Value Ref Range   HM Mammogram 0-4 Bi-Rad 0-4 Bi-Rad, Self Reported Normal      Assessment & Plan:   Problem List Items Addressed This Visit      Cardiovascular and Mediastinum   Demand ischemia ([place])    The current medical regimen is effective;  continue present plan and medications.         Other   Vitamin D deficiency    Will check vitamin D level      Chronic fatigue    Checking blood work       Other Visit Diagnoses    Leukocytosis, unspecified type    -  Primary   Pre-diabetes        History of prediabetes we will check hemoglobin A1c  Telemedicine using audio/video telecommunications for a synchronous communication visit. Today's visit due to COVID-19 isolation precautions I connected with and verified that I am speaking with the  correct person using two identifiers.   I discussed the limitations, risks, security and privacy concerns of performing an evaluation and management service by telecommunication and the availability of in person appointments. I also discussed with the patient that there may be a patient responsible charge related to this service. The patient expressed understanding and agreed to proceed. The patient's location is home. I am at home.   I discussed the assessment and treatment plan with the patient. The patient was provided an opportunity to ask questions and all were answered. The patient agreed with the plan and demonstrated an understanding of the instructions.   The patient was advised to call back or seek an in-person evaluation if the symptoms worsen or if the condition fails to improve as anticipated.   I provided 21+ minutes of time during this encounter. Follow up plan: Return if symptoms worsen or fail to improve.

## 2019-06-06 NOTE — Assessment & Plan Note (Signed)
Will check vitamin-D level.

## 2019-06-06 NOTE — Assessment & Plan Note (Signed)
The current medical regimen is effective;  continue present plan and medications.  

## 2019-06-10 ENCOUNTER — Other Ambulatory Visit: Payer: Medicare Other

## 2019-06-10 ENCOUNTER — Other Ambulatory Visit: Payer: Self-pay

## 2019-06-10 DIAGNOSIS — D72829 Elevated white blood cell count, unspecified: Secondary | ICD-10-CM | POA: Diagnosis not present

## 2019-06-10 DIAGNOSIS — R7303 Prediabetes: Secondary | ICD-10-CM | POA: Diagnosis not present

## 2019-06-10 DIAGNOSIS — R5382 Chronic fatigue, unspecified: Secondary | ICD-10-CM

## 2019-06-10 DIAGNOSIS — E559 Vitamin D deficiency, unspecified: Secondary | ICD-10-CM

## 2019-06-10 LAB — BAYER DCA HB A1C WAIVED: HB A1C (BAYER DCA - WAIVED): 5.6 % (ref <7.0)

## 2019-06-11 LAB — TSH: TSH: 4.59 u[IU]/mL — ABNORMAL HIGH (ref 0.450–4.500)

## 2019-06-11 LAB — CBC WITH DIFFERENTIAL/PLATELET
Basophils Absolute: 0 10*3/uL (ref 0.0–0.2)
Basos: 1 %
EOS (ABSOLUTE): 0.2 10*3/uL (ref 0.0–0.4)
Eos: 3 %
Hematocrit: 41.2 % (ref 34.0–46.6)
Hemoglobin: 13.5 g/dL (ref 11.1–15.9)
Immature Grans (Abs): 0 10*3/uL (ref 0.0–0.1)
Immature Granulocytes: 0 %
Lymphocytes Absolute: 2.5 10*3/uL (ref 0.7–3.1)
Lymphs: 40 %
MCH: 30.6 pg (ref 26.6–33.0)
MCHC: 32.8 g/dL (ref 31.5–35.7)
MCV: 93 fL (ref 79–97)
Monocytes Absolute: 0.4 10*3/uL (ref 0.1–0.9)
Monocytes: 7 %
Neutrophils Absolute: 3.1 10*3/uL (ref 1.4–7.0)
Neutrophils: 49 %
Platelets: 250 10*3/uL (ref 150–450)
RBC: 4.41 x10E6/uL (ref 3.77–5.28)
RDW: 12.2 % (ref 11.7–15.4)
WBC: 6.2 10*3/uL (ref 3.4–10.8)

## 2019-06-11 LAB — VITAMIN D 25 HYDROXY (VIT D DEFICIENCY, FRACTURES): Vit D, 25-Hydroxy: 41.1 ng/mL (ref 30.0–100.0)

## 2019-06-11 LAB — COMPREHENSIVE METABOLIC PANEL
ALT: 14 IU/L (ref 0–32)
AST: 14 IU/L (ref 0–40)
Albumin/Globulin Ratio: 1.8 (ref 1.2–2.2)
Albumin: 4.5 g/dL (ref 3.8–4.8)
Alkaline Phosphatase: 95 IU/L (ref 39–117)
BUN/Creatinine Ratio: 21 (ref 12–28)
BUN: 16 mg/dL (ref 8–27)
Bilirubin Total: 0.5 mg/dL (ref 0.0–1.2)
CO2: 21 mmol/L (ref 20–29)
Calcium: 9.8 mg/dL (ref 8.7–10.3)
Chloride: 104 mmol/L (ref 96–106)
Creatinine, Ser: 0.75 mg/dL (ref 0.57–1.00)
GFR calc Af Amer: 94 mL/min/{1.73_m2} (ref >59)
GFR calc non Af Amer: 82 mL/min/{1.73_m2} (ref >59)
Globulin, Total: 2.5 g/dL (ref 1.5–4.5)
Glucose: 110 mg/dL — ABNORMAL HIGH (ref 65–99)
Potassium: 4.1 mmol/L (ref 3.5–5.2)
Sodium: 139 mmol/L (ref 134–144)
Total Protein: 7 g/dL (ref 6.0–8.5)

## 2019-06-11 LAB — SEDIMENTATION RATE: Sed Rate: 25 mm/hr (ref 0–40)

## 2019-06-12 ENCOUNTER — Encounter: Payer: Self-pay | Admitting: Family Medicine

## 2019-06-12 ENCOUNTER — Telehealth: Payer: Self-pay

## 2019-06-12 ENCOUNTER — Other Ambulatory Visit: Payer: Self-pay | Admitting: Family Medicine

## 2019-06-12 DIAGNOSIS — R7989 Other specified abnormal findings of blood chemistry: Secondary | ICD-10-CM

## 2019-06-12 DIAGNOSIS — L299 Pruritus, unspecified: Secondary | ICD-10-CM

## 2019-06-12 NOTE — Telephone Encounter (Signed)
Patient notified. Copied from [place] [id number]. Topic: General - Inquiry >> Jun 12, 2019 11:29 AM Richardo Priest, NT wrote: Reason for CRM: Patient called in stating she is having itching still for her eyes, ears, bottom of feet, nose. Normally starts with tingle and numbness for areas then goes to itching and cannot find relief with benadryl. Patient states its been on and off for almost 2 years. Patient says it has been like this for almost 2-3 months with no stopping. Possibly looking for a referral. Please advise and call back is [phone]. >> Jun 12, 2019  2:59 PM Guadalupe Maple, MD wrote: Have patient try Allegra or Zyrtec

## 2019-06-12 NOTE — Telephone Encounter (Signed)
Pt was advised of note from Dr. Jeananne Rama but Pt would like to know why she is having this experience and wants to see a specialist to find out the reasoning instead of taking medication. Pt would like to know if she can have a referral to an allergist or a dermatologist/ please advise

## 2019-06-12 NOTE — Telephone Encounter (Signed)
Copied from [place] [id number]. Topic: General - Inquiry >> Jun 12, 2019  2:59 PM Guadalupe Maple, MD wrote: Have patient try Allegra or Zyrtec

## 2019-06-12 NOTE — Telephone Encounter (Signed)
Routing to provider to advise. Patient requesting referral.

## 2019-06-13 NOTE — Telephone Encounter (Signed)
Call pt. Referral done

## 2019-06-13 NOTE — Telephone Encounter (Signed)
Called and left patient a VM (signed DPR) letting her know that Dr. Jeananne Rama entered her referral as requested.

## 2019-06-13 NOTE — Addendum Note (Signed)
Addended by: Golden Pop A on: 06/13/2019 11:20 AM   Modules accepted: Orders

## 2019-07-15 ENCOUNTER — Telehealth: Payer: Self-pay | Admitting: Family Medicine

## 2019-07-15 NOTE — Telephone Encounter (Signed)
Pt called and stated that she would like to get TSH T3 T4  FREET3 FREET4 and a RT3.  Pt thinks that she is pre diabetic and having thyroid issues.   Also wants to address neuropathy.    Pt requesting a nurse to call back.

## 2019-07-15 NOTE — Telephone Encounter (Signed)
Patient called to add a referral request to her previous message.  She would like to see and endocrinologist and a neurologist as well.  Please let patient know when these can be approved.

## 2019-07-16 ENCOUNTER — Encounter: Payer: Self-pay | Admitting: Family Medicine

## 2019-07-16 NOTE — Telephone Encounter (Signed)
Patient checking on the status of message below, please advise (patient would like referral with [facility] specialist)

## 2019-07-16 NOTE — Telephone Encounter (Signed)
error:[id number] ° °

## 2019-07-17 NOTE — Telephone Encounter (Signed)
Call pt Will need OV to sort out

## 2019-07-17 NOTE — Telephone Encounter (Signed)
appt scheduled

## 2019-07-18 ENCOUNTER — Encounter: Payer: Self-pay | Admitting: Family Medicine

## 2019-07-18 ENCOUNTER — Ambulatory Visit (INDEPENDENT_AMBULATORY_CARE_PROVIDER_SITE_OTHER): Payer: Medicare Other | Admitting: Family Medicine

## 2019-07-18 ENCOUNTER — Other Ambulatory Visit: Payer: Self-pay

## 2019-07-18 DIAGNOSIS — R200 Anesthesia of skin: Secondary | ICD-10-CM | POA: Insufficient documentation

## 2019-07-18 DIAGNOSIS — F419 Anxiety disorder, unspecified: Secondary | ICD-10-CM | POA: Diagnosis not present

## 2019-07-18 DIAGNOSIS — R7989 Other specified abnormal findings of blood chemistry: Secondary | ICD-10-CM | POA: Diagnosis not present

## 2019-07-18 DIAGNOSIS — F1320 Sedative, hypnotic or anxiolytic dependence, uncomplicated: Secondary | ICD-10-CM | POA: Insufficient documentation

## 2019-07-18 DIAGNOSIS — R202 Paresthesia of skin: Secondary | ICD-10-CM | POA: Insufficient documentation

## 2019-07-18 DIAGNOSIS — F3341 Major depressive disorder, recurrent, in partial remission: Secondary | ICD-10-CM | POA: Diagnosis not present

## 2019-07-18 MED ORDER — DULOXETINE HCL 30 MG PO CPEP: 30.0000 mg | ORAL_CAPSULE | Freq: Every day | ORAL | 0 refills | Status: DC

## 2019-07-18 MED ORDER — DULOXETINE HCL 60 MG PO CPEP: 60.0000 mg | ORAL_CAPSULE | Freq: Every day | ORAL | 3 refills | Status: DC

## 2019-07-18 NOTE — Assessment & Plan Note (Signed)
Patient with ongoing marked anxiety with great deal of additional stress.  Is trying to taper Xanax prescription. Discussed depression and nerves will start Cymbalta 30 mg 1 a day for a week then 60 mg.

## 2019-07-18 NOTE — Assessment & Plan Note (Signed)
Check thyroid studies 

## 2019-07-18 NOTE — Assessment & Plan Note (Signed)
Hand-foot numbness plus multiple other symptoms will check lab work and cervical spine x-ray

## 2019-07-18 NOTE — Progress Notes (Signed)
 There were no vitals taken for this visit.   Subjective:    Patient ID: Catherine Hughes, female    DOB: 09/29/1951, 68 y.o.   MRN: [id number]  HPI: Catherine Hughes is a 68 y.o. female Patient has multiple concerns of which some are listed below. Patient also has a great deal of stress going on in her life. Sharp pain in feet also hands  Also itching about all over Hands numb  Back worse Limited activity Marked nausea Sound sensitivity Marked anxiety Sore throat and chest soreness like in past with thrush. last 2 weeks much worse.   Relevant past medical, surgical, family and social history reviewed and updated as indicated. Interim medical history since our last visit reviewed. Allergies and medications reviewed and updated.  Review of Systems  Unable to perform ROS: Other  Just about everything is positive  Per HPI unless specifically indicated above     Objective:    There were no vitals taken for this visit.  Wt Readings from Last 3 Encounters:  11/28/18 174 lb (78.9 kg)  09/12/18 171 lb 6.4 oz (77.7 kg)  08/06/18 165 lb 14.4 oz (75.3 kg)    Physical Exam  Results for orders placed or performed in visit on 06/10/19  Sed Rate (ESR)  Result Value Ref Range   Sed Rate 25 0 - 40 mm/hr  VITAMIN D 25 Hydroxy (Vit-D Deficiency, Fractures)  Result Value Ref Range   Vit D, 25-Hydroxy 41.1 30.0 - 100.0 ng/mL  TSH  Result Value Ref Range   TSH 4.590 (H) 0.450 - 4.500 uIU/mL  CBC with Differential/Platelet  Result Value Ref Range   WBC 6.2 3.4 - 10.8 x10E3/uL   RBC 4.41 3.77 - 5.28 x10E6/uL   Hemoglobin 13.5 11.1 - 15.9 g/dL   Hematocrit 41.2 34.0 - 46.6 %   MCV 93 79 - 97 fL   MCH 30.6 26.6 - 33.0 pg   MCHC 32.8 31.5 - 35.7 g/dL   RDW 12.2 11.7 - 15.4 %   Platelets 250 150 - 450 x10E3/uL   Neutrophils 49 Not Estab. %   Lymphs 40 Not Estab. %   Monocytes 7 Not Estab. %   Eos 3 Not Estab. %   Basos 1 Not Estab. %   Neutrophils Absolute 3.1 1.4 - 7.0  x10E3/uL   Lymphocytes Absolute 2.5 0.7 - 3.1 x10E3/uL   Monocytes Absolute 0.4 0.1 - 0.9 x10E3/uL   EOS (ABSOLUTE) 0.2 0.0 - 0.4 x10E3/uL   Basophils Absolute 0.0 0.0 - 0.2 x10E3/uL   Immature Granulocytes 0 Not Estab. %   Immature Grans (Abs) 0.0 0.0 - 0.1 x10E3/uL  Comprehensive metabolic panel  Result Value Ref Range   Glucose 110 (H) 65 - 99 mg/dL   BUN 16 8 - 27 mg/dL   Creatinine, Ser 0.75 0.57 - 1.00 mg/dL   GFR calc non Af Amer 82 >59 mL/min/1.73   GFR calc Af Amer 94 >59 mL/min/1.73   BUN/Creatinine Ratio 21 12 - 28   Sodium 139 134 - 144 mmol/L   Potassium 4.1 3.5 - 5.2 mmol/L   Chloride 104 96 - 106 mmol/L   CO2 21 20 - 29 mmol/L   Calcium 9.8 8.7 - 10.3 mg/dL   Total Protein 7.0 6.0 - 8.5 g/dL   Albumin 4.5 3.8 - 4.8 g/dL   Globulin, Total 2.5 1.5 - 4.5 g/dL   Albumin/Globulin Ratio 1.8 1.2 - 2.2   Bilirubin Total 0.5 0.0 - 1.2 mg/dL     Alkaline Phosphatase 95 39 - 117 IU/L   AST 14 0 - 40 IU/L   ALT 14 0 - 32 IU/L  Bayer DCA Hb A1c Waived  Result Value Ref Range   HB A1C (BAYER DCA - WAIVED) 5.6 <7.0 %      Assessment & Plan:   Problem List Items Addressed This Visit      Other   Anxiety    Patient with ongoing marked anxiety with great deal of additional stress.  Is trying to taper Xanax prescription. Discussed depression and nerves will start Cymbalta 30 mg 1 a day for a week then 60 mg.      Relevant Medications   DULoxetine (CYMBALTA) 30 MG capsule   DULoxetine (CYMBALTA) 60 MG capsule   Benzodiazepine dependence (HCC)   Recurrent major depressive disorder, in partial remission ([place])    Discussed depression and anxiety starting Cymbalta see above note      Relevant Medications   DULoxetine (CYMBALTA) 30 MG capsule   DULoxetine (CYMBALTA) 60 MG capsule   Bilateral hand numbness    Hand-foot numbness plus multiple other symptoms will check lab work and cervical spine x-ray      Relevant Orders   Vitamin B12   Folate   TSH   DG Cervical  Spine 2 or 3 views   Elevated TSH    Check thyroid studies      Relevant Orders   T3   T4   T4, free      Telemedicine using audio/video telecommunications for a synchronous communication visit. Today's visit due to COVID-19 isolation precautions I connected with and verified that I am speaking with the correct person using two identifiers.   I discussed the limitations, risks, security and privacy concerns of performing an evaluation and management service by telecommunication and the availability of in person appointments. I also discussed with the patient that there may be a patient responsible charge related to this service. The patient expressed understanding and agreed to proceed. The patient's location is home. I am at home.   I discussed the assessment and treatment plan with the patient. The patient was provided an opportunity to ask questions and all were answered. The patient agreed with the plan and demonstrated an understanding of the instructions.   The patient was advised to call back or seek an in-person evaluation if the symptoms worsen or if the condition fails to improve as anticipated.   I provided 21+ minutes of time during this encounter. Follow up plan: Return in about 2 weeks (around 08/01/2019), or if symptoms worsen or fail to improve.

## 2019-07-18 NOTE — Assessment & Plan Note (Signed)
Discussed depression and anxiety starting Cymbalta see above note

## 2019-07-19 ENCOUNTER — Other Ambulatory Visit: Payer: Medicare Other

## 2019-07-19 ENCOUNTER — Other Ambulatory Visit: Payer: Self-pay

## 2019-07-19 DIAGNOSIS — R200 Anesthesia of skin: Secondary | ICD-10-CM

## 2019-07-19 DIAGNOSIS — R7989 Other specified abnormal findings of blood chemistry: Secondary | ICD-10-CM

## 2019-07-20 LAB — FOLATE: Folate: >20 ng/mL (ref >3.0)

## 2019-07-20 LAB — T4, FREE: Free T4: 1.16 ng/dL (ref 0.82–1.77)

## 2019-07-20 LAB — T3: T3, Total: 137 ng/dL (ref 71–180)

## 2019-07-20 LAB — T4: T4, Total: 7.5 ug/dL (ref 4.5–12.0)

## 2019-07-20 LAB — TSH: TSH: 2.81 u[IU]/mL (ref 0.450–4.500)

## 2019-07-20 LAB — VITAMIN B12: Vitamin B-12: 404 pg/mL (ref 232–1245)

## 2019-07-22 ENCOUNTER — Telehealth: Payer: Self-pay | Admitting: Family Medicine

## 2019-07-22 ENCOUNTER — Encounter: Payer: Self-pay | Admitting: Family Medicine

## 2019-07-22 ENCOUNTER — Other Ambulatory Visit: Payer: Self-pay | Admitting: Family Medicine

## 2019-07-22 DIAGNOSIS — F419 Anxiety disorder, unspecified: Secondary | ICD-10-CM

## 2019-07-22 DIAGNOSIS — R200 Anesthesia of skin: Secondary | ICD-10-CM

## 2019-07-22 DIAGNOSIS — F3341 Major depressive disorder, recurrent, in partial remission: Secondary | ICD-10-CM

## 2019-07-22 MED ORDER — DULOXETINE HCL 30 MG PO CPEP: 30.0000 mg | ORAL_CAPSULE | Freq: Every day | ORAL | 0 refills | Status: DC

## 2019-07-22 MED ORDER — DULOXETINE HCL 60 MG PO CPEP: 60.0000 mg | ORAL_CAPSULE | Freq: Every day | ORAL | 3 refills | Status: DC

## 2019-07-22 NOTE — Telephone Encounter (Signed)
Pt called in to update PCP, the imaging location doesn't do X-rays at the location that he sent her to, pt would like to have imaging set up at a different location.      ALSO, pt says that she was told by pharmacy that they gave her DULoxetine (CYMBALTA) 30 MG capsule, pt says that she don't recall getting them. Pt would like to know if provider could send in a new with override so that she can have her medication?    Pharmacy: [facility] [place], [place] AT [facility] OF [address]    Please assist pt further.

## 2019-07-22 NOTE — Telephone Encounter (Signed)
Call pt rx sent AGAIN X ray sent AGAIN to [facility]

## 2019-07-22 NOTE — Telephone Encounter (Signed)
Called pt let her know rx has been resent and gave her the correct address to go to for imaging.

## 2019-07-24 ENCOUNTER — Other Ambulatory Visit: Payer: Self-pay

## 2019-07-24 ENCOUNTER — Ambulatory Visit (INDEPENDENT_AMBULATORY_CARE_PROVIDER_SITE_OTHER): Payer: Medicare Other | Admitting: Family Medicine

## 2019-07-24 ENCOUNTER — Encounter: Payer: Self-pay | Admitting: Family Medicine

## 2019-07-24 DIAGNOSIS — F411 Generalized anxiety disorder: Secondary | ICD-10-CM

## 2019-07-24 DIAGNOSIS — M792 Neuralgia and neuritis, unspecified: Secondary | ICD-10-CM | POA: Diagnosis not present

## 2019-07-24 NOTE — Progress Notes (Signed)
   There were no vitals taken for this visit.   Subjective:    Patient ID: Catherine Hughes, female    DOB: 1951/08/15, 68 y.o.   MRN: [id number]  HPI: [name] is a 68 y.o. female  Med check Discussed with patient blood work which was normal. Patient has not gone for x-ray of her neck yet discussed results would probably be back Friday and that I will be gone. Discussed someone from our office should discuss with her the results and if showing spinal stenosis neurosurgery referral if not consider neurology referral. Patient is also started Cymbalta had some sleepless night and some nausea discussed taking in the morning with food and continue 30 mg for a week then increase to 60 mg.  Relevant past medical, surgical, family and social history reviewed and updated as indicated. Interim medical history since our last visit reviewed. Allergies and medications reviewed and updated.  Review of Systems  Constitutional: Negative.   Respiratory: Negative.   Cardiovascular: Negative.     Per HPI unless specifically indicated above     Objective:    There were no vitals taken for this visit.  Wt Readings from Last 3 Encounters:  11/28/18 174 lb (78.9 kg)  09/12/18 171 lb 6.4 oz (77.7 kg)  08/06/18 165 lb 14.4 oz (75.3 kg)    Physical Exam  Results for orders placed or performed in visit on 07/19/19  T4, free  Result Value Ref Range   Free T4 1.16 0.82 - 1.77 ng/dL  T4  Result Value Ref Range   T4, Total 7.5 4.5 - 12.0 ug/dL  T3  Result Value Ref Range   T3, Total 137 71 - 180 ng/dL  TSH  Result Value Ref Range   TSH 2.810 0.450 - 4.500 uIU/mL  Folate  Result Value Ref Range   Folate >20.0 >3.0 ng/mL  Vitamin B12  Result Value Ref Range   Vitamin B-12 404 232 - 1,245 pg/mL      Assessment & Plan:   Problem List Items Addressed This Visit      Other   Neurogenic pain (Chronic)    Patient pending neck x-ray further work-up pending results      Generalized  anxiety disorder (Chronic)    Discussed Cymbalta patient will continue          Telemedicine using audio/video telecommunications for a synchronous communication visit. Today's visit due to COVID-19 isolation precautions I connected with and verified that I am speaking with the correct person using two identifiers.   I discussed the limitations, risks, security and privacy concerns of performing an evaluation and management service by telecommunication and the availability of in person appointments. I also discussed with the patient that there may be a patient responsible charge related to this service. The patient expressed understanding and agreed to proceed. The patient's location is home. I am at home.   I discussed the assessment and treatment plan with the patient. The patient was provided an opportunity to ask questions and all were answered. The patient agreed with the plan and demonstrated an understanding of the instructions.   The patient was advised to call back or seek an in-person evaluation if the symptoms worsen or if the condition fails to improve as anticipated.   I provided 21+ minutes of time during this encounter. Follow up plan: No follow-ups on file.

## 2019-07-24 NOTE — Assessment & Plan Note (Signed)
Patient pending neck x-ray further work-up pending results

## 2019-07-24 NOTE — Assessment & Plan Note (Signed)
Discussed Cymbalta patient will continue

## 2019-07-25 ENCOUNTER — Ambulatory Visit
Admission: RE | Admit: 2019-07-25 | Discharge: 2019-07-25 | Disposition: A | Discharge status: Home / Self Care | Payer: Medicare Other | Source: Ambulatory Visit | Attending: Family Medicine | Admitting: Family Medicine

## 2019-07-25 ENCOUNTER — Other Ambulatory Visit: Payer: Self-pay

## 2019-07-25 ENCOUNTER — Ambulatory Visit
Admission: RE | Admit: 2019-07-25 | Discharge: 2019-07-25 | Disposition: A | Discharge status: Home / Self Care | Payer: Medicare Other | Attending: Family Medicine | Admitting: Family Medicine

## 2019-07-25 ENCOUNTER — Telehealth: Payer: Self-pay | Admitting: Family Medicine

## 2019-07-25 DIAGNOSIS — M542 Cervicalgia: Secondary | ICD-10-CM | POA: Diagnosis not present

## 2019-07-25 DIAGNOSIS — R200 Anesthesia of skin: Secondary | ICD-10-CM | POA: Diagnosis not present

## 2019-07-25 NOTE — Telephone Encounter (Signed)
Copied from [place] [phone]. Topic: General - Other >> Jul 25, 2019  8:48 AM Keene Breath wrote: Reason for CRM: Patient called to inform the doctor that she had her x-ray this morning.  She also said that she cannot take the medication, DULoxetine (CYMBALTA) 30 MG capsule.  It makes her feel sick and the tingling is intense.   Also patient would like a referral to a neuropathist as soon as possible.  Please call to discuss at [phone]

## 2019-07-26 ENCOUNTER — Other Ambulatory Visit: Payer: Self-pay | Admitting: Family Medicine

## 2019-07-26 ENCOUNTER — Encounter: Payer: Self-pay | Admitting: Family Medicine

## 2019-07-26 DIAGNOSIS — M4802 Spinal stenosis, cervical region: Secondary | ICD-10-CM

## 2019-07-26 NOTE — Progress Notes (Signed)
Phone call Discussed with patient hand numbness foot numbness may be coming from spinal stenosis according to x-ray will refer to neurosurgery to further evaluate and find that insurance hassle for an MRI.

## 2019-07-29 ENCOUNTER — Other Ambulatory Visit: Payer: Self-pay | Admitting: Family Medicine

## 2019-07-29 NOTE — Telephone Encounter (Signed)
Requested medication (s) are due for refill today: yes  Requested medication (s) are on the active medication list: yes  Last refill:  05/01/2019  Future visit scheduled: no  Notes to clinic:  Refill cannot be delegated    Requested Prescriptions  Pending Prescriptions Disp Refills   temazepam (RESTORIL) 15 MG capsule [Pharmacy Med Name: TEMAZEPAM 15MG  CAPSULES] 90 capsule     Sig: TAKE 1 CAPSULE BY MOUTH EVERY NIGHT AS NEEDED FOR SLEEP     Not Delegated - Psychiatry:  Anxiolytics/Hypnotics Failed - 07/29/2019  3:14 AM      Failed - This refill cannot be delegated      Failed - Urine Drug Screen completed in last 360 days.      Passed - Valid encounter within last 6 months    Recent Outpatient Visits          5 days ago Generalized anxiety disorder   [place] [facility], Jeannette How, MD   1 week ago [place] [facility], Jeannette How, MD   1 month ago Leukocytosis, unspecified type   [facility], Jeannette How, MD   6 months ago Demand ischemia [facility])   [facility], Jeannette How, MD   8 months ago Demand ischemia [facility])   [place], Jeannette How, MD              cyclobenzaprine (FLEXERIL) 5 MG tablet [Pharmacy Med Name: CYCLOBENZAPRINE 5MG  TABLETS] 270 tablet 1    Sig: TAKE 1 TABLET(5 MG) BY MOUTH THREE TIMES DAILY     Not Delegated - Analgesics:  Muscle Relaxants Failed - 07/29/2019  3:14 AM      Failed - This refill cannot be delegated      Passed - Valid encounter within last 6 months    Recent Outpatient Visits          5 days ago Generalized anxiety disorder   [place] [facility], Jeannette How, MD   1 week ago [place] [facility], Jeannette How, MD   1 month ago Leukocytosis, unspecified type   [facility], Jeannette How, MD   6 months ago Demand ischemia [facility])   [facility], Jeannette How, MD   8 months ago Demand ischemia  [facility])   [facility], Jeannette How, MD

## 2019-07-29 NOTE — Telephone Encounter (Signed)
Requested medication (s) are due for refill today: yes  Requested medication (s) are on the active medication list: yes  Last refill:  05/01/2019  Future visit scheduled: no  Notes to clinic:  Refill cannot be delegated   Requested Prescriptions  Pending Prescriptions Disp Refills   ALPRAZolam (XANAX) 1 MG tablet [Pharmacy Med Name: ALPRAZOLAM 1MG  TABLETS] 270 tablet     Sig: TAKE 1 TABLET(1 MG) BY MOUTH THREE TIMES DAILY AS NEEDED FOR ANXIETY     Not Delegated - Psychiatry:  Anxiolytics/Hypnotics Failed - 07/29/2019  7:14 AM      Failed - This refill cannot be delegated      Failed - Urine Drug Screen completed in last 360 days.      Passed - Valid encounter within last 6 months    Recent Outpatient Visits          5 days ago Generalized anxiety disorder   [place] [facility], Jeannette How, MD   1 week ago [place], MD   1 month ago Leukocytosis, unspecified type   [facility], Jeannette How, MD   6 months ago Demand ischemia [facility])   [facility], Jeannette How, MD   8 months ago Demand ischemia [facility])   [facility], Jeannette How, MD

## 2019-07-31 NOTE — Telephone Encounter (Signed)
Needs appt  Copied from [place] [id number]. Topic: Referral - Request for Referral >> Jul 30, 2019 11:38 AM Alanda Slim E wrote: Has patient seen PCP for this complaint? Yes  *If NO, is insurance requiring patient see PCP for this issue before PCP can refer them? Referral for which specialty: endocrinology  Preferred provider/office:  Reason for referral: Pt is having extreme itching and tingling of feet and hands

## 2019-07-31 NOTE — Telephone Encounter (Signed)
Called pt and offered appt, pt refused.

## 2019-08-13 DIAGNOSIS — G629 Polyneuropathy, unspecified: Secondary | ICD-10-CM | POA: Diagnosis not present

## 2019-08-13 DIAGNOSIS — M5412 Radiculopathy, cervical region: Secondary | ICD-10-CM | POA: Diagnosis not present

## 2019-08-14 ENCOUNTER — Other Ambulatory Visit: Payer: Self-pay | Admitting: Student

## 2019-08-14 DIAGNOSIS — M5412 Radiculopathy, cervical region: Secondary | ICD-10-CM

## 2019-08-21 ENCOUNTER — Telehealth: Payer: Self-pay | Admitting: Family Medicine

## 2019-08-21 NOTE — Telephone Encounter (Signed)
Copied from [place] [id number]. Topic: Referral - Status >> Aug 15, 2019  1:27 PM Scherrie Gerlach wrote: Reason for CRM: pt states Dr Jeananne Rama referred her to the wrong dr. (neurosurgeon) they told her at that appt she should have seen a neurologist.  Pt states that is what she asked for: a neurologist.  Also she had to pay $45 at that appt and she wants Dr Jeananne Rama to pay that money back to her. Pt is very upset, feels like she was treated badly and she say she is furious.. >> Aug 19, 2019 10:49 AM Barth Kirks B wrote: Spoke with patient regarding concerns on 08/15/19. Advised patient that I would research and return her call with additional information.

## 2019-08-25 ENCOUNTER — Other Ambulatory Visit: Payer: Self-pay

## 2019-08-25 ENCOUNTER — Ambulatory Visit
Admission: RE | Admit: 2019-08-25 | Discharge: 2019-08-25 | Disposition: A | Discharge status: Home / Self Care | Payer: Medicare Other | Source: Ambulatory Visit | Attending: Student | Admitting: Student

## 2019-08-25 DIAGNOSIS — M542 Cervicalgia: Secondary | ICD-10-CM | POA: Diagnosis not present

## 2019-08-25 DIAGNOSIS — M5412 Radiculopathy, cervical region: Secondary | ICD-10-CM | POA: Diagnosis not present

## 2019-08-26 DIAGNOSIS — R200 Anesthesia of skin: Secondary | ICD-10-CM | POA: Diagnosis not present

## 2019-08-26 DIAGNOSIS — R202 Paresthesia of skin: Secondary | ICD-10-CM | POA: Diagnosis not present

## 2019-09-18 DIAGNOSIS — R202 Paresthesia of skin: Secondary | ICD-10-CM | POA: Diagnosis not present

## 2019-09-18 DIAGNOSIS — R200 Anesthesia of skin: Secondary | ICD-10-CM | POA: Diagnosis not present

## 2019-09-18 DIAGNOSIS — M79602 Pain in left arm: Secondary | ICD-10-CM | POA: Diagnosis not present

## 2019-10-15 DIAGNOSIS — M064 Inflammatory polyarthropathy: Secondary | ICD-10-CM | POA: Insufficient documentation

## 2019-10-15 DIAGNOSIS — R200 Anesthesia of skin: Secondary | ICD-10-CM | POA: Diagnosis not present

## 2019-10-15 DIAGNOSIS — M797 Fibromyalgia: Secondary | ICD-10-CM | POA: Insufficient documentation

## 2019-10-15 DIAGNOSIS — G629 Polyneuropathy, unspecified: Secondary | ICD-10-CM | POA: Diagnosis not present

## 2019-10-15 DIAGNOSIS — G4700 Insomnia, unspecified: Secondary | ICD-10-CM | POA: Diagnosis not present

## 2019-10-15 DIAGNOSIS — M5416 Radiculopathy, lumbar region: Secondary | ICD-10-CM | POA: Diagnosis not present

## 2019-10-15 DIAGNOSIS — Z23 Encounter for immunization: Secondary | ICD-10-CM | POA: Diagnosis not present

## 2019-10-15 DIAGNOSIS — M545 Low back pain: Secondary | ICD-10-CM | POA: Diagnosis not present

## 2019-10-15 DIAGNOSIS — G5603 Carpal tunnel syndrome, bilateral upper limbs: Secondary | ICD-10-CM | POA: Diagnosis not present

## 2019-10-15 DIAGNOSIS — R202 Paresthesia of skin: Secondary | ICD-10-CM | POA: Diagnosis not present

## 2019-10-29 DIAGNOSIS — R202 Paresthesia of skin: Secondary | ICD-10-CM | POA: Insufficient documentation

## 2019-10-29 DIAGNOSIS — M79601 Pain in right arm: Secondary | ICD-10-CM | POA: Diagnosis not present

## 2019-10-29 DIAGNOSIS — R200 Anesthesia of skin: Secondary | ICD-10-CM | POA: Diagnosis not present

## 2019-10-29 DIAGNOSIS — M79602 Pain in left arm: Secondary | ICD-10-CM | POA: Diagnosis not present

## 2019-11-07 DIAGNOSIS — G8929 Other chronic pain: Secondary | ICD-10-CM | POA: Diagnosis not present

## 2019-11-07 DIAGNOSIS — M5416 Radiculopathy, lumbar region: Secondary | ICD-10-CM | POA: Diagnosis not present

## 2019-11-07 DIAGNOSIS — M62838 Other muscle spasm: Secondary | ICD-10-CM | POA: Diagnosis not present

## 2019-12-05 ENCOUNTER — Encounter: Payer: Self-pay | Admitting: Nurse Practitioner

## 2019-12-05 ENCOUNTER — Telehealth (INDEPENDENT_AMBULATORY_CARE_PROVIDER_SITE_OTHER): Payer: Medicare Other | Admitting: Nurse Practitioner

## 2019-12-05 VITALS — Temp 97.5°F

## 2019-12-05 DIAGNOSIS — G894 Chronic pain syndrome: Secondary | ICD-10-CM | POA: Diagnosis not present

## 2019-12-05 MED ORDER — TIZANIDINE HCL 4 MG PO CAPS: 4.0000 mg | ORAL_CAPSULE | Freq: Two times a day (BID) | ORAL | 0 refills | Status: DC

## 2019-12-05 MED ORDER — TIZANIDINE HCL 4 MG PO CAPS: 4.0000 mg | ORAL_CAPSULE | Freq: Two times a day (BID) | ORAL | 0 refills | Status: DC | PRN

## 2019-12-05 NOTE — Progress Notes (Signed)
Temp (!) 97.5 F (36.4 C)    Subjective:    Patient ID: Catherine Hughes, female    DOB: 1950/11/13, 69 y.o.   MRN: [id number]  HPI: Catherine Hughes is a 69 y.o. female  Chief Complaint  Patient presents with  . Medication Problem    Flexeril    . This visit was completed via My Chart Video due to the restrictions of the COVID-19 pandemic. All issues as above were discussed and addressed. Physical exam was done as above through visual confirmation on My Chart Video. If it was felt that the patient should be evaluated in the office, they were directed there. The patient verbally consented to this visit. . Location of the patient: home . Location of the provider: home . Those involved with this call:  . Provider: Marnee Guarneri, DNP . CMA: Catherine Hughes, CMA . Front Desk/Registration: Catherine Hughes  . Time spent on call: 15 minutes with patient face to face via video conference. More than 50% of this time was spent in counseling and coordination of care. 10 minutes total spent in review of patient's record and preparation of their chart.   CHRONIC PAIN  Has lengthy history of chronic back and leg/neuropathy pain on review.  Current medications include Duloxetine and Flexeril, although has not been taking.  Reports she has been taking Flexeril for 15 years and it is not working anymore.  Reports she has taken Soma in past and this is only thing that works.  Has Xanax that she takes 2 tablets a day of, previously 3 tablets.  Reported use of Temazepam at night to [place], which is noted on chart.  States antidepressants make her not feel herself and does not wish to try these, has tried many in past.  Saw pain clinic over 20 years ago per her report, discussed with her that things have changed in 20 years and she would benefit from return to pain clinic to discuss further options.  - Seen by neurology for initial visit on 09/18/2019, Dr. Melrose Hughes, and due to ongoing  numbness/tingling/and itching to bilateral feet and hands.  Was to start Nortriptyline 10 MG nightly for one week, then increase to 20 MG.  Nerve conduction testing was normal on 10/15/2019.  Has seen 4 neurologists.  Her mother has history of neuropathy  - On 10/15/2019 she was seen at [facility] to establish care with Dr. Elie Hughes -- they made referral to neurology and was given #10 Tramadol for severe pain + she requested script for [facility] -- they also provided script for Remeron 7.5 MG, was seen again by the on 11/07/2019 and appears to have "adamantly requesting Soma" at visit and she reported she did not tolerate Tramadol -- she did agree to Indocin, states this is not working and neither is Tramadol.  On PMP review her last fill was Tramadol 10/28/2019 for 3 tablets.   Pain control status: uncontrolled Duration: chronic Location: hands, legs, back Quality: dull, aching and burning Current Pain Level: 5/10 Previous Pain Level: 7/10  Past medications: Gabapentin (made too sedated), Cymbalta (made her feel woozy) Benefit from narcotic medications: she reports none with Tramadol What Activities task can be accomplished with current medication? Daily ADLs Previous pain specialty evaluation: 20 years ago Non-narcotic analgesic meds: yes  Relevant past medical, surgical, family and social history reviewed and updated as indicated. Interim medical history since our last visit reviewed. Allergies and medications reviewed and updated.  Review of Systems  Constitutional: Negative for activity change, appetite change, diaphoresis, fatigue and fever.  Respiratory: Negative for cough, chest tightness, shortness of breath and wheezing.   Cardiovascular: Negative for chest pain, palpitations and leg swelling.  Gastrointestinal: Negative.   Musculoskeletal: Positive for arthralgias.  Neurological: Negative.   Psychiatric/Behavioral: Negative.     Per HPI unless specifically indicated  above     Objective:    Temp (!) 97.5 F (36.4 C)   Wt Readings from Last 3 Encounters:  11/28/18 174 lb (78.9 kg)  09/12/18 171 lb 6.4 oz (77.7 kg)  08/06/18 165 lb 14.4 oz (75.3 kg)    Physical Exam Vitals and nursing note reviewed.  Constitutional:      General: She is awake. She is not in acute distress.    Appearance: She is well-developed. She is not ill-appearing.  HENT:     Head: Normocephalic.     Right Ear: Hearing normal.     Left Ear: Hearing normal.  Eyes:     General: Lids are normal.        Right eye: No discharge.        Left eye: No discharge.     Conjunctiva/sclera: Conjunctivae normal.  Pulmonary:     Effort: Pulmonary effort is normal. No accessory muscle usage or respiratory distress.  Musculoskeletal:     Cervical back: Normal range of motion.  Neurological:     Mental Status: She is alert and oriented to person, place, and time.  Psychiatric:        Attention and Perception: Attention normal.        Mood and Affect: Mood normal.        Behavior: Behavior normal. Behavior is cooperative.        Thought Content: Thought content normal.        Judgment: Judgment normal.     Results for orders placed or performed in visit on 07/19/19  T4, free  Result Value Ref Range   Free T4 1.16 0.82 - 1.77 ng/dL  T4  Result Value Ref Range   T4, Total 7.5 4.5 - 12.0 ug/dL  T3  Result Value Ref Range   T3, Total 137 71 - 180 ng/dL  TSH  Result Value Ref Range   TSH 2.810 0.450 - 4.500 uIU/mL  Folate  Result Value Ref Range   Folate >20.0 >3.0 ng/mL  Vitamin B12  Result Value Ref Range   Vitamin B-12 404 232 - 1,245 pg/mL      Assessment & Plan:   Problem List Items Addressed This Visit      Other   Chronic pain syndrome - Primary (Chronic)    Chronic, long standing issue.  Appears multifactorial.  At this time she has established care with a PCP at [facility], have advised her to continue care there to avoid confusion and can return in future if  needed to reestablish at this practice.  Discussed at length BEERS criteria, that Manuela Neptune is to be avoided and could cause major effects in conjunction with her Restoril and Xanax.  Will send in 10 pills of Tizanidine PRN to try vs continuing Flexeril.  Discussed referral to pain clinic, due to ongoing pain, she agrees with this plan.  At this time she has agreed to continue care with [facility] PCP for further guidance and to avoid confusion.  May return to clinic as needed in future to reestablish care.       Relevant Medications   tiZANidine (ZANAFLEX) 4 MG capsule  Other Relevant Orders   Ambulatory referral to Pain Clinic       Follow up plan: Return for may return in future to reestablish care, currently being followed by Dr. Elie Hughes at [facility] for PCP.

## 2019-12-05 NOTE — Patient Instructions (Signed)

## 2019-12-05 NOTE — Assessment & Plan Note (Signed)
Chronic, long standing issue.  Appears multifactorial.  At this time she has established care with a PCP at [facility], have advised her to continue care there to avoid confusion and can return in future if needed to reestablish at this practice.  Discussed at length BEERS criteria, that Catherine Hughes is to be avoided and could cause major effects in conjunction with her Restoril and Xanax.  Will send in 10 pills of Tizanidine PRN to try vs continuing Flexeril.  Discussed referral to pain clinic, due to ongoing pain, she agrees with this plan.  At this time she has agreed to continue care with [facility] PCP for further guidance and to avoid confusion.  May return to clinic as needed in future to reestablish care.

## 2019-12-11 ENCOUNTER — Ambulatory Visit: Payer: Medicare Other

## 2019-12-17 DIAGNOSIS — M19041 Primary osteoarthritis, right hand: Secondary | ICD-10-CM | POA: Diagnosis not present

## 2019-12-17 DIAGNOSIS — M79644 Pain in right finger(s): Secondary | ICD-10-CM | POA: Diagnosis not present

## 2020-01-02 ENCOUNTER — Ambulatory Visit: Payer: Medicare Other | Admitting: Nurse Practitioner

## 2020-01-06 DIAGNOSIS — M67449 Ganglion, unspecified hand: Secondary | ICD-10-CM | POA: Diagnosis not present

## 2020-01-06 DIAGNOSIS — M79644 Pain in right finger(s): Secondary | ICD-10-CM | POA: Diagnosis not present

## 2020-01-08 ENCOUNTER — Telehealth: Payer: Self-pay

## 2020-01-08 NOTE — Telephone Encounter (Signed)
Called and talked to patient about New patient appt on 01/13/20. Patient was a former Dr Dossie Arbour patient and would like to see another MD. After talking with the patient in great lengths, she states that she is not willing to do any  Procedures and only wants a few pain pills to get her until she can have surgery on her finger and back. After that she would not be coming to the clinic.  Discussed with patients  what our MD goals are for their practice. Patient states she does not want to come back and that she would find another place. Patient did say that she does not like to take meds that are not natural and only eats organic foods. Patient wishes to cancel appt.

## 2020-01-13 ENCOUNTER — Ambulatory Visit: Payer: Medicare Other | Admitting: Student in an Organized Health Care Education/Training Program

## 2020-01-20 DIAGNOSIS — R2231 Localized swelling, mass and lump, right upper limb: Secondary | ICD-10-CM | POA: Diagnosis not present

## 2020-01-20 DIAGNOSIS — M19041 Primary osteoarthritis, right hand: Secondary | ICD-10-CM | POA: Diagnosis not present

## 2020-01-24 DIAGNOSIS — R2231 Localized swelling, mass and lump, right upper limb: Secondary | ICD-10-CM | POA: Insufficient documentation

## 2020-01-24 DIAGNOSIS — M19041 Primary osteoarthritis, right hand: Secondary | ICD-10-CM | POA: Insufficient documentation

## 2020-01-28 DIAGNOSIS — M810 Age-related osteoporosis without current pathological fracture: Secondary | ICD-10-CM | POA: Diagnosis not present

## 2020-01-28 DIAGNOSIS — Z1159 Encounter for screening for other viral diseases: Secondary | ICD-10-CM | POA: Diagnosis not present

## 2020-01-28 DIAGNOSIS — M8580 Other specified disorders of bone density and structure, unspecified site: Secondary | ICD-10-CM | POA: Diagnosis not present

## 2020-01-28 DIAGNOSIS — Z13220 Encounter for screening for lipoid disorders: Secondary | ICD-10-CM | POA: Diagnosis not present

## 2020-01-28 DIAGNOSIS — Z1231 Encounter for screening mammogram for malignant neoplasm of breast: Secondary | ICD-10-CM | POA: Diagnosis not present

## 2020-01-28 DIAGNOSIS — R202 Paresthesia of skin: Secondary | ICD-10-CM | POA: Diagnosis not present

## 2020-01-28 DIAGNOSIS — M79601 Pain in right arm: Secondary | ICD-10-CM | POA: Diagnosis not present

## 2020-01-28 DIAGNOSIS — G894 Chronic pain syndrome: Secondary | ICD-10-CM | POA: Diagnosis not present

## 2020-01-28 DIAGNOSIS — M79602 Pain in left arm: Secondary | ICD-10-CM | POA: Diagnosis not present

## 2020-01-28 DIAGNOSIS — Z8639 Personal history of other endocrine, nutritional and metabolic disease: Secondary | ICD-10-CM | POA: Diagnosis not present

## 2020-02-05 DIAGNOSIS — Z87891 Personal history of nicotine dependence: Secondary | ICD-10-CM | POA: Diagnosis not present

## 2020-02-05 DIAGNOSIS — M545 Low back pain: Secondary | ICD-10-CM | POA: Diagnosis not present

## 2020-02-05 DIAGNOSIS — M792 Neuralgia and neuritis, unspecified: Secondary | ICD-10-CM | POA: Diagnosis not present

## 2020-02-05 DIAGNOSIS — M79604 Pain in right leg: Secondary | ICD-10-CM | POA: Diagnosis not present

## 2020-02-05 DIAGNOSIS — M79642 Pain in left hand: Secondary | ICD-10-CM | POA: Diagnosis not present

## 2020-02-11 DIAGNOSIS — M545 Low back pain: Secondary | ICD-10-CM | POA: Diagnosis not present

## 2020-02-11 DIAGNOSIS — M4326 Fusion of spine, lumbar region: Secondary | ICD-10-CM | POA: Diagnosis not present

## 2020-02-11 DIAGNOSIS — M549 Dorsalgia, unspecified: Secondary | ICD-10-CM | POA: Diagnosis not present

## 2020-02-11 DIAGNOSIS — M4312 Spondylolisthesis, cervical region: Secondary | ICD-10-CM | POA: Diagnosis not present

## 2020-02-11 DIAGNOSIS — G8929 Other chronic pain: Secondary | ICD-10-CM | POA: Diagnosis not present

## 2020-02-11 DIAGNOSIS — G894 Chronic pain syndrome: Secondary | ICD-10-CM | POA: Diagnosis not present

## 2020-02-11 DIAGNOSIS — M542 Cervicalgia: Secondary | ICD-10-CM | POA: Diagnosis not present

## 2020-02-11 DIAGNOSIS — M47812 Spondylosis without myelopathy or radiculopathy, cervical region: Secondary | ICD-10-CM | POA: Diagnosis not present

## 2020-02-13 DIAGNOSIS — R202 Paresthesia of skin: Secondary | ICD-10-CM | POA: Diagnosis not present

## 2020-02-13 DIAGNOSIS — R200 Anesthesia of skin: Secondary | ICD-10-CM | POA: Diagnosis not present

## 2020-02-13 DIAGNOSIS — R2231 Localized swelling, mass and lump, right upper limb: Secondary | ICD-10-CM | POA: Diagnosis not present

## 2020-02-13 DIAGNOSIS — G8929 Other chronic pain: Secondary | ICD-10-CM | POA: Diagnosis not present

## 2020-02-13 DIAGNOSIS — M62838 Other muscle spasm: Secondary | ICD-10-CM | POA: Diagnosis not present

## 2020-02-13 DIAGNOSIS — M545 Low back pain: Secondary | ICD-10-CM | POA: Diagnosis not present

## 2020-02-19 DIAGNOSIS — G894 Chronic pain syndrome: Secondary | ICD-10-CM | POA: Diagnosis not present

## 2020-02-19 DIAGNOSIS — G8929 Other chronic pain: Secondary | ICD-10-CM | POA: Diagnosis not present

## 2020-02-19 DIAGNOSIS — M545 Low back pain: Secondary | ICD-10-CM | POA: Diagnosis not present

## 2020-02-19 DIAGNOSIS — M546 Pain in thoracic spine: Secondary | ICD-10-CM | POA: Diagnosis not present

## 2020-02-25 DIAGNOSIS — M5416 Radiculopathy, lumbar region: Secondary | ICD-10-CM | POA: Diagnosis not present

## 2020-02-25 DIAGNOSIS — G629 Polyneuropathy, unspecified: Secondary | ICD-10-CM | POA: Diagnosis not present

## 2020-03-03 DIAGNOSIS — G894 Chronic pain syndrome: Secondary | ICD-10-CM | POA: Diagnosis not present

## 2020-03-03 DIAGNOSIS — M810 Age-related osteoporosis without current pathological fracture: Secondary | ICD-10-CM | POA: Diagnosis not present

## 2020-03-03 DIAGNOSIS — M5126 Other intervertebral disc displacement, lumbar region: Secondary | ICD-10-CM | POA: Diagnosis not present

## 2020-03-03 DIAGNOSIS — G8929 Other chronic pain: Secondary | ICD-10-CM | POA: Diagnosis not present

## 2020-03-03 DIAGNOSIS — M47814 Spondylosis without myelopathy or radiculopathy, thoracic region: Secondary | ICD-10-CM | POA: Diagnosis not present

## 2020-03-03 DIAGNOSIS — M546 Pain in thoracic spine: Secondary | ICD-10-CM | POA: Diagnosis not present

## 2020-03-03 DIAGNOSIS — M9973 Connective tissue and disc stenosis of intervertebral foramina of lumbar region: Secondary | ICD-10-CM | POA: Diagnosis not present

## 2020-03-03 DIAGNOSIS — M9972 Connective tissue and disc stenosis of intervertebral foramina of thoracic region: Secondary | ICD-10-CM | POA: Diagnosis not present

## 2020-03-03 DIAGNOSIS — M545 Low back pain: Secondary | ICD-10-CM | POA: Diagnosis not present

## 2020-03-05 DIAGNOSIS — M62838 Other muscle spasm: Secondary | ICD-10-CM | POA: Diagnosis not present

## 2020-03-05 DIAGNOSIS — G629 Polyneuropathy, unspecified: Secondary | ICD-10-CM | POA: Diagnosis not present

## 2020-03-05 DIAGNOSIS — M5416 Radiculopathy, lumbar region: Secondary | ICD-10-CM | POA: Diagnosis not present

## 2020-03-05 DIAGNOSIS — G8929 Other chronic pain: Secondary | ICD-10-CM | POA: Diagnosis not present

## 2020-03-17 DIAGNOSIS — M5416 Radiculopathy, lumbar region: Secondary | ICD-10-CM | POA: Diagnosis not present

## 2020-03-17 DIAGNOSIS — M810 Age-related osteoporosis without current pathological fracture: Secondary | ICD-10-CM | POA: Diagnosis not present

## 2020-03-17 DIAGNOSIS — G8929 Other chronic pain: Secondary | ICD-10-CM | POA: Diagnosis not present

## 2020-03-22 DIAGNOSIS — G894 Chronic pain syndrome: Secondary | ICD-10-CM | POA: Diagnosis not present

## 2020-03-22 DIAGNOSIS — M4726 Other spondylosis with radiculopathy, lumbar region: Secondary | ICD-10-CM | POA: Diagnosis not present

## 2020-03-22 DIAGNOSIS — M159 Polyosteoarthritis, unspecified: Secondary | ICD-10-CM | POA: Diagnosis not present

## 2020-03-22 DIAGNOSIS — M47812 Spondylosis without myelopathy or radiculopathy, cervical region: Secondary | ICD-10-CM | POA: Diagnosis not present

## 2020-03-22 DIAGNOSIS — I890 Lymphedema, not elsewhere classified: Secondary | ICD-10-CM | POA: Diagnosis not present

## 2020-03-22 DIAGNOSIS — M810 Age-related osteoporosis without current pathological fracture: Secondary | ICD-10-CM | POA: Diagnosis not present

## 2020-03-22 DIAGNOSIS — M5030 Other cervical disc degeneration, unspecified cervical region: Secondary | ICD-10-CM | POA: Diagnosis not present

## 2020-03-22 DIAGNOSIS — G629 Polyneuropathy, unspecified: Secondary | ICD-10-CM | POA: Diagnosis not present

## 2020-03-22 DIAGNOSIS — M797 Fibromyalgia: Secondary | ICD-10-CM | POA: Diagnosis not present

## 2020-03-24 DIAGNOSIS — I890 Lymphedema, not elsewhere classified: Secondary | ICD-10-CM | POA: Diagnosis not present

## 2020-03-24 DIAGNOSIS — M47812 Spondylosis without myelopathy or radiculopathy, cervical region: Secondary | ICD-10-CM | POA: Diagnosis not present

## 2020-03-24 DIAGNOSIS — M5030 Other cervical disc degeneration, unspecified cervical region: Secondary | ICD-10-CM | POA: Diagnosis not present

## 2020-03-24 DIAGNOSIS — M797 Fibromyalgia: Secondary | ICD-10-CM | POA: Diagnosis not present

## 2020-03-24 DIAGNOSIS — M159 Polyosteoarthritis, unspecified: Secondary | ICD-10-CM | POA: Diagnosis not present

## 2020-03-24 DIAGNOSIS — M4726 Other spondylosis with radiculopathy, lumbar region: Secondary | ICD-10-CM | POA: Diagnosis not present

## 2020-03-24 DIAGNOSIS — G894 Chronic pain syndrome: Secondary | ICD-10-CM | POA: Diagnosis not present

## 2020-03-24 DIAGNOSIS — G629 Polyneuropathy, unspecified: Secondary | ICD-10-CM | POA: Diagnosis not present

## 2020-03-24 DIAGNOSIS — M810 Age-related osteoporosis without current pathological fracture: Secondary | ICD-10-CM | POA: Diagnosis not present

## 2020-03-26 DIAGNOSIS — M797 Fibromyalgia: Secondary | ICD-10-CM | POA: Diagnosis not present

## 2020-03-26 DIAGNOSIS — M5030 Other cervical disc degeneration, unspecified cervical region: Secondary | ICD-10-CM | POA: Diagnosis not present

## 2020-03-26 DIAGNOSIS — I890 Lymphedema, not elsewhere classified: Secondary | ICD-10-CM | POA: Diagnosis not present

## 2020-03-26 DIAGNOSIS — G894 Chronic pain syndrome: Secondary | ICD-10-CM | POA: Diagnosis not present

## 2020-03-26 DIAGNOSIS — G629 Polyneuropathy, unspecified: Secondary | ICD-10-CM | POA: Diagnosis not present

## 2020-03-26 DIAGNOSIS — M4726 Other spondylosis with radiculopathy, lumbar region: Secondary | ICD-10-CM | POA: Diagnosis not present

## 2020-03-26 DIAGNOSIS — M47812 Spondylosis without myelopathy or radiculopathy, cervical region: Secondary | ICD-10-CM | POA: Diagnosis not present

## 2020-03-26 DIAGNOSIS — M159 Polyosteoarthritis, unspecified: Secondary | ICD-10-CM | POA: Diagnosis not present

## 2020-03-26 DIAGNOSIS — M810 Age-related osteoporosis without current pathological fracture: Secondary | ICD-10-CM | POA: Diagnosis not present

## 2020-03-31 DIAGNOSIS — M810 Age-related osteoporosis without current pathological fracture: Secondary | ICD-10-CM | POA: Diagnosis not present

## 2020-03-31 DIAGNOSIS — G894 Chronic pain syndrome: Secondary | ICD-10-CM | POA: Diagnosis not present

## 2020-03-31 DIAGNOSIS — M4726 Other spondylosis with radiculopathy, lumbar region: Secondary | ICD-10-CM | POA: Diagnosis not present

## 2020-03-31 DIAGNOSIS — G629 Polyneuropathy, unspecified: Secondary | ICD-10-CM | POA: Diagnosis not present

## 2020-03-31 DIAGNOSIS — I890 Lymphedema, not elsewhere classified: Secondary | ICD-10-CM | POA: Diagnosis not present

## 2020-03-31 DIAGNOSIS — M159 Polyosteoarthritis, unspecified: Secondary | ICD-10-CM | POA: Diagnosis not present

## 2020-03-31 DIAGNOSIS — M5030 Other cervical disc degeneration, unspecified cervical region: Secondary | ICD-10-CM | POA: Diagnosis not present

## 2020-03-31 DIAGNOSIS — M797 Fibromyalgia: Secondary | ICD-10-CM | POA: Diagnosis not present

## 2020-03-31 DIAGNOSIS — M47812 Spondylosis without myelopathy or radiculopathy, cervical region: Secondary | ICD-10-CM | POA: Diagnosis not present

## 2020-04-02 DIAGNOSIS — M797 Fibromyalgia: Secondary | ICD-10-CM | POA: Diagnosis not present

## 2020-04-02 DIAGNOSIS — M159 Polyosteoarthritis, unspecified: Secondary | ICD-10-CM | POA: Diagnosis not present

## 2020-04-02 DIAGNOSIS — G629 Polyneuropathy, unspecified: Secondary | ICD-10-CM | POA: Diagnosis not present

## 2020-04-02 DIAGNOSIS — M810 Age-related osteoporosis without current pathological fracture: Secondary | ICD-10-CM | POA: Diagnosis not present

## 2020-04-02 DIAGNOSIS — M4726 Other spondylosis with radiculopathy, lumbar region: Secondary | ICD-10-CM | POA: Diagnosis not present

## 2020-04-02 DIAGNOSIS — G894 Chronic pain syndrome: Secondary | ICD-10-CM | POA: Diagnosis not present

## 2020-04-02 DIAGNOSIS — M5030 Other cervical disc degeneration, unspecified cervical region: Secondary | ICD-10-CM | POA: Diagnosis not present

## 2020-04-02 DIAGNOSIS — I890 Lymphedema, not elsewhere classified: Secondary | ICD-10-CM | POA: Diagnosis not present

## 2020-04-02 DIAGNOSIS — M47812 Spondylosis without myelopathy or radiculopathy, cervical region: Secondary | ICD-10-CM | POA: Diagnosis not present

## 2020-04-06 DIAGNOSIS — M797 Fibromyalgia: Secondary | ICD-10-CM | POA: Diagnosis not present

## 2020-04-06 DIAGNOSIS — M159 Polyosteoarthritis, unspecified: Secondary | ICD-10-CM | POA: Diagnosis not present

## 2020-04-06 DIAGNOSIS — I890 Lymphedema, not elsewhere classified: Secondary | ICD-10-CM | POA: Diagnosis not present

## 2020-04-06 DIAGNOSIS — M810 Age-related osteoporosis without current pathological fracture: Secondary | ICD-10-CM | POA: Diagnosis not present

## 2020-04-06 DIAGNOSIS — M47812 Spondylosis without myelopathy or radiculopathy, cervical region: Secondary | ICD-10-CM | POA: Diagnosis not present

## 2020-04-06 DIAGNOSIS — G629 Polyneuropathy, unspecified: Secondary | ICD-10-CM | POA: Diagnosis not present

## 2020-04-06 DIAGNOSIS — M5030 Other cervical disc degeneration, unspecified cervical region: Secondary | ICD-10-CM | POA: Diagnosis not present

## 2020-04-06 DIAGNOSIS — G894 Chronic pain syndrome: Secondary | ICD-10-CM | POA: Diagnosis not present

## 2020-04-06 DIAGNOSIS — M4726 Other spondylosis with radiculopathy, lumbar region: Secondary | ICD-10-CM | POA: Diagnosis not present

## 2020-04-07 DIAGNOSIS — M546 Pain in thoracic spine: Secondary | ICD-10-CM | POA: Diagnosis not present

## 2020-04-07 DIAGNOSIS — M9902 Segmental and somatic dysfunction of thoracic region: Secondary | ICD-10-CM | POA: Diagnosis not present

## 2020-04-07 DIAGNOSIS — R519 Headache, unspecified: Secondary | ICD-10-CM | POA: Diagnosis not present

## 2020-04-07 DIAGNOSIS — M9901 Segmental and somatic dysfunction of cervical region: Secondary | ICD-10-CM | POA: Diagnosis not present

## 2020-04-08 DIAGNOSIS — G629 Polyneuropathy, unspecified: Secondary | ICD-10-CM | POA: Diagnosis not present

## 2020-04-08 DIAGNOSIS — M546 Pain in thoracic spine: Secondary | ICD-10-CM | POA: Diagnosis not present

## 2020-04-08 DIAGNOSIS — M5416 Radiculopathy, lumbar region: Secondary | ICD-10-CM | POA: Diagnosis not present

## 2020-04-08 DIAGNOSIS — M9901 Segmental and somatic dysfunction of cervical region: Secondary | ICD-10-CM | POA: Diagnosis not present

## 2020-04-08 DIAGNOSIS — G8929 Other chronic pain: Secondary | ICD-10-CM | POA: Diagnosis not present

## 2020-04-08 DIAGNOSIS — M9902 Segmental and somatic dysfunction of thoracic region: Secondary | ICD-10-CM | POA: Diagnosis not present

## 2020-04-08 DIAGNOSIS — R519 Headache, unspecified: Secondary | ICD-10-CM | POA: Diagnosis not present

## 2020-04-13 DIAGNOSIS — M5030 Other cervical disc degeneration, unspecified cervical region: Secondary | ICD-10-CM | POA: Diagnosis not present

## 2020-04-13 DIAGNOSIS — M159 Polyosteoarthritis, unspecified: Secondary | ICD-10-CM | POA: Diagnosis not present

## 2020-04-13 DIAGNOSIS — M4726 Other spondylosis with radiculopathy, lumbar region: Secondary | ICD-10-CM | POA: Diagnosis not present

## 2020-04-13 DIAGNOSIS — M810 Age-related osteoporosis without current pathological fracture: Secondary | ICD-10-CM | POA: Diagnosis not present

## 2020-04-13 DIAGNOSIS — M797 Fibromyalgia: Secondary | ICD-10-CM | POA: Diagnosis not present

## 2020-04-13 DIAGNOSIS — M9902 Segmental and somatic dysfunction of thoracic region: Secondary | ICD-10-CM | POA: Diagnosis not present

## 2020-04-13 DIAGNOSIS — I890 Lymphedema, not elsewhere classified: Secondary | ICD-10-CM | POA: Diagnosis not present

## 2020-04-13 DIAGNOSIS — G894 Chronic pain syndrome: Secondary | ICD-10-CM | POA: Diagnosis not present

## 2020-04-13 DIAGNOSIS — G629 Polyneuropathy, unspecified: Secondary | ICD-10-CM | POA: Diagnosis not present

## 2020-04-13 DIAGNOSIS — M546 Pain in thoracic spine: Secondary | ICD-10-CM | POA: Diagnosis not present

## 2020-04-13 DIAGNOSIS — M47812 Spondylosis without myelopathy or radiculopathy, cervical region: Secondary | ICD-10-CM | POA: Diagnosis not present

## 2020-04-13 DIAGNOSIS — M9901 Segmental and somatic dysfunction of cervical region: Secondary | ICD-10-CM | POA: Diagnosis not present

## 2020-04-13 DIAGNOSIS — R519 Headache, unspecified: Secondary | ICD-10-CM | POA: Diagnosis not present

## 2020-04-15 DIAGNOSIS — M5030 Other cervical disc degeneration, unspecified cervical region: Secondary | ICD-10-CM | POA: Diagnosis not present

## 2020-04-15 DIAGNOSIS — M47812 Spondylosis without myelopathy or radiculopathy, cervical region: Secondary | ICD-10-CM | POA: Diagnosis not present

## 2020-04-15 DIAGNOSIS — G629 Polyneuropathy, unspecified: Secondary | ICD-10-CM | POA: Diagnosis not present

## 2020-04-15 DIAGNOSIS — I890 Lymphedema, not elsewhere classified: Secondary | ICD-10-CM | POA: Diagnosis not present

## 2020-04-15 DIAGNOSIS — G894 Chronic pain syndrome: Secondary | ICD-10-CM | POA: Diagnosis not present

## 2020-04-15 DIAGNOSIS — M810 Age-related osteoporosis without current pathological fracture: Secondary | ICD-10-CM | POA: Diagnosis not present

## 2020-04-15 DIAGNOSIS — M9901 Segmental and somatic dysfunction of cervical region: Secondary | ICD-10-CM | POA: Diagnosis not present

## 2020-04-15 DIAGNOSIS — M159 Polyosteoarthritis, unspecified: Secondary | ICD-10-CM | POA: Diagnosis not present

## 2020-04-15 DIAGNOSIS — R519 Headache, unspecified: Secondary | ICD-10-CM | POA: Diagnosis not present

## 2020-04-15 DIAGNOSIS — M4726 Other spondylosis with radiculopathy, lumbar region: Secondary | ICD-10-CM | POA: Diagnosis not present

## 2020-04-15 DIAGNOSIS — M9902 Segmental and somatic dysfunction of thoracic region: Secondary | ICD-10-CM | POA: Diagnosis not present

## 2020-04-15 DIAGNOSIS — M797 Fibromyalgia: Secondary | ICD-10-CM | POA: Diagnosis not present

## 2020-04-15 DIAGNOSIS — M546 Pain in thoracic spine: Secondary | ICD-10-CM | POA: Diagnosis not present

## 2020-04-17 DIAGNOSIS — M546 Pain in thoracic spine: Secondary | ICD-10-CM | POA: Diagnosis not present

## 2020-04-17 DIAGNOSIS — M9901 Segmental and somatic dysfunction of cervical region: Secondary | ICD-10-CM | POA: Diagnosis not present

## 2020-04-17 DIAGNOSIS — M9902 Segmental and somatic dysfunction of thoracic region: Secondary | ICD-10-CM | POA: Diagnosis not present

## 2020-04-17 DIAGNOSIS — R519 Headache, unspecified: Secondary | ICD-10-CM | POA: Diagnosis not present

## 2020-04-18 DIAGNOSIS — G894 Chronic pain syndrome: Secondary | ICD-10-CM | POA: Diagnosis not present

## 2020-04-18 DIAGNOSIS — M5030 Other cervical disc degeneration, unspecified cervical region: Secondary | ICD-10-CM | POA: Diagnosis not present

## 2020-04-18 DIAGNOSIS — G629 Polyneuropathy, unspecified: Secondary | ICD-10-CM | POA: Diagnosis not present

## 2020-04-18 DIAGNOSIS — M47812 Spondylosis without myelopathy or radiculopathy, cervical region: Secondary | ICD-10-CM | POA: Diagnosis not present

## 2020-04-18 DIAGNOSIS — M4726 Other spondylosis with radiculopathy, lumbar region: Secondary | ICD-10-CM | POA: Diagnosis not present

## 2020-04-20 DIAGNOSIS — M9901 Segmental and somatic dysfunction of cervical region: Secondary | ICD-10-CM | POA: Diagnosis not present

## 2020-04-20 DIAGNOSIS — M9902 Segmental and somatic dysfunction of thoracic region: Secondary | ICD-10-CM | POA: Diagnosis not present

## 2020-04-20 DIAGNOSIS — M546 Pain in thoracic spine: Secondary | ICD-10-CM | POA: Diagnosis not present

## 2020-04-20 DIAGNOSIS — R519 Headache, unspecified: Secondary | ICD-10-CM | POA: Diagnosis not present

## 2020-04-22 DIAGNOSIS — M9901 Segmental and somatic dysfunction of cervical region: Secondary | ICD-10-CM | POA: Diagnosis not present

## 2020-04-22 DIAGNOSIS — M9902 Segmental and somatic dysfunction of thoracic region: Secondary | ICD-10-CM | POA: Diagnosis not present

## 2020-04-22 DIAGNOSIS — R519 Headache, unspecified: Secondary | ICD-10-CM | POA: Diagnosis not present

## 2020-04-22 DIAGNOSIS — M546 Pain in thoracic spine: Secondary | ICD-10-CM | POA: Diagnosis not present

## 2020-04-24 DIAGNOSIS — R519 Headache, unspecified: Secondary | ICD-10-CM | POA: Diagnosis not present

## 2020-04-24 DIAGNOSIS — M546 Pain in thoracic spine: Secondary | ICD-10-CM | POA: Diagnosis not present

## 2020-04-24 DIAGNOSIS — M9902 Segmental and somatic dysfunction of thoracic region: Secondary | ICD-10-CM | POA: Diagnosis not present

## 2020-04-24 DIAGNOSIS — M9901 Segmental and somatic dysfunction of cervical region: Secondary | ICD-10-CM | POA: Diagnosis not present

## 2020-04-27 DIAGNOSIS — R519 Headache, unspecified: Secondary | ICD-10-CM | POA: Diagnosis not present

## 2020-04-27 DIAGNOSIS — M546 Pain in thoracic spine: Secondary | ICD-10-CM | POA: Diagnosis not present

## 2020-04-27 DIAGNOSIS — M9901 Segmental and somatic dysfunction of cervical region: Secondary | ICD-10-CM | POA: Diagnosis not present

## 2020-04-27 DIAGNOSIS — M9902 Segmental and somatic dysfunction of thoracic region: Secondary | ICD-10-CM | POA: Diagnosis not present

## 2020-04-29 DIAGNOSIS — M9901 Segmental and somatic dysfunction of cervical region: Secondary | ICD-10-CM | POA: Diagnosis not present

## 2020-04-29 DIAGNOSIS — R519 Headache, unspecified: Secondary | ICD-10-CM | POA: Diagnosis not present

## 2020-04-29 DIAGNOSIS — M9902 Segmental and somatic dysfunction of thoracic region: Secondary | ICD-10-CM | POA: Diagnosis not present

## 2020-04-29 DIAGNOSIS — M546 Pain in thoracic spine: Secondary | ICD-10-CM | POA: Diagnosis not present

## 2020-05-01 DIAGNOSIS — M546 Pain in thoracic spine: Secondary | ICD-10-CM | POA: Diagnosis not present

## 2020-05-01 DIAGNOSIS — R519 Headache, unspecified: Secondary | ICD-10-CM | POA: Diagnosis not present

## 2020-05-01 DIAGNOSIS — M9902 Segmental and somatic dysfunction of thoracic region: Secondary | ICD-10-CM | POA: Diagnosis not present

## 2020-05-01 DIAGNOSIS — M9901 Segmental and somatic dysfunction of cervical region: Secondary | ICD-10-CM | POA: Diagnosis not present

## 2020-05-04 DIAGNOSIS — R519 Headache, unspecified: Secondary | ICD-10-CM | POA: Diagnosis not present

## 2020-05-04 DIAGNOSIS — M9901 Segmental and somatic dysfunction of cervical region: Secondary | ICD-10-CM | POA: Diagnosis not present

## 2020-05-04 DIAGNOSIS — M546 Pain in thoracic spine: Secondary | ICD-10-CM | POA: Diagnosis not present

## 2020-05-04 DIAGNOSIS — M9902 Segmental and somatic dysfunction of thoracic region: Secondary | ICD-10-CM | POA: Diagnosis not present

## 2020-05-06 DIAGNOSIS — M546 Pain in thoracic spine: Secondary | ICD-10-CM | POA: Diagnosis not present

## 2020-05-06 DIAGNOSIS — R519 Headache, unspecified: Secondary | ICD-10-CM | POA: Diagnosis not present

## 2020-05-06 DIAGNOSIS — M9902 Segmental and somatic dysfunction of thoracic region: Secondary | ICD-10-CM | POA: Diagnosis not present

## 2020-05-06 DIAGNOSIS — M9901 Segmental and somatic dysfunction of cervical region: Secondary | ICD-10-CM | POA: Diagnosis not present

## 2020-05-11 DIAGNOSIS — R519 Headache, unspecified: Secondary | ICD-10-CM | POA: Diagnosis not present

## 2020-05-11 DIAGNOSIS — M9902 Segmental and somatic dysfunction of thoracic region: Secondary | ICD-10-CM | POA: Diagnosis not present

## 2020-05-11 DIAGNOSIS — M9901 Segmental and somatic dysfunction of cervical region: Secondary | ICD-10-CM | POA: Diagnosis not present

## 2020-05-11 DIAGNOSIS — M546 Pain in thoracic spine: Secondary | ICD-10-CM | POA: Diagnosis not present

## 2020-05-12 DIAGNOSIS — M9902 Segmental and somatic dysfunction of thoracic region: Secondary | ICD-10-CM | POA: Diagnosis not present

## 2020-05-12 DIAGNOSIS — M546 Pain in thoracic spine: Secondary | ICD-10-CM | POA: Diagnosis not present

## 2020-05-12 DIAGNOSIS — R519 Headache, unspecified: Secondary | ICD-10-CM | POA: Diagnosis not present

## 2020-05-12 DIAGNOSIS — M9901 Segmental and somatic dysfunction of cervical region: Secondary | ICD-10-CM | POA: Diagnosis not present

## 2021-04-27 ENCOUNTER — Ambulatory Visit: Payer: Medicare Other | Admitting: Internal Medicine

## 2021-07-14 IMAGING — MR MR CERVICAL SPINE W/O CM
5 series · 38 of 48 positions shown · non-contrast
Comparison: Cervical spine radiographs 07/25/2019. Cervical spine
CT 03/23/2011.

CLINICAL DATA: 67-year-old female with chronic neck pain. Upper and
lower extremity neuropathy.

EXAM:
MRI CERVICAL SPINE WITHOUT CONTRAST
TECHNIQUE: Multiplanar, multisequence MR imaging of the cervical spine was
performed. No intravenous contrast was administered.

[Series 5: T2 · sagittal · 3.0mm · 0.62mm/px · 6 of 15 slices shown (1 of 2)]
[im 1/15]
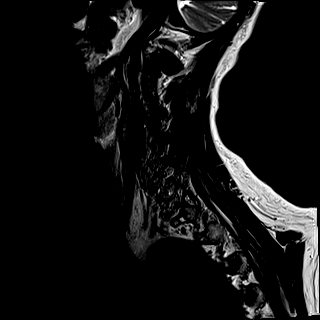
[im 3/15]
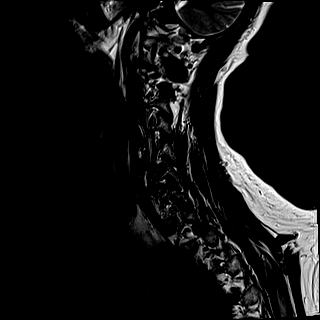
[im 6/15]
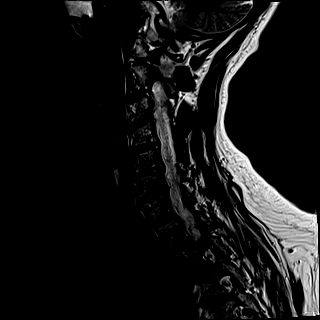
[im 9/15]
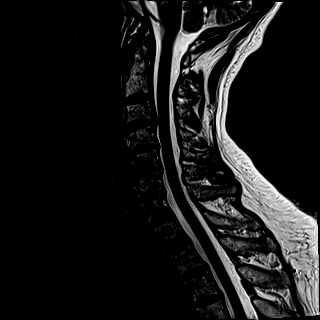
[im 12/15]
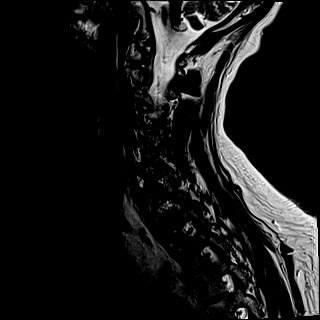
[im 15/15]
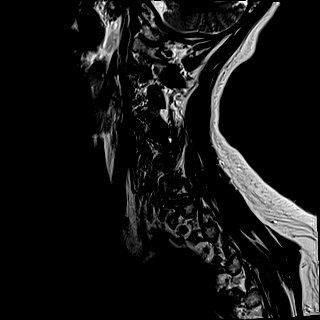

[Series 6: FLAIR · sagittal · 3.0mm · 0.78mm/px · 7 of 15 slices shown]
[im 1/15]
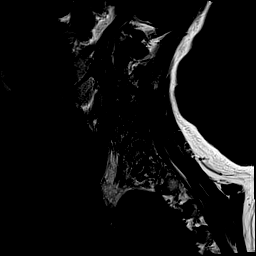
[im 3/15]
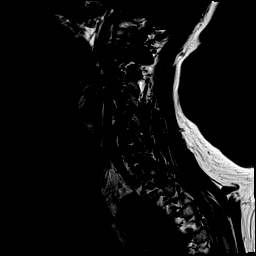
[im 5/15]
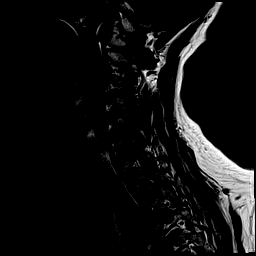
[im 8/15]
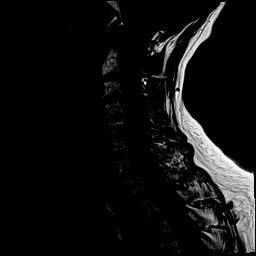
[im 10/15]
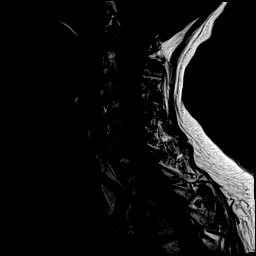
[im 12/15]
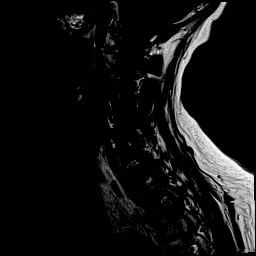
[im 15/15]
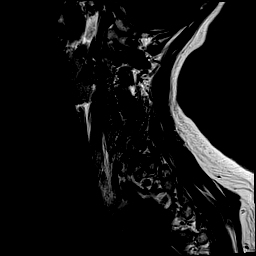

[Series 7: STIR · sagittal · 3.0mm · 0.62mm/px · 7 of 15 slices shown]
[im 1/15]
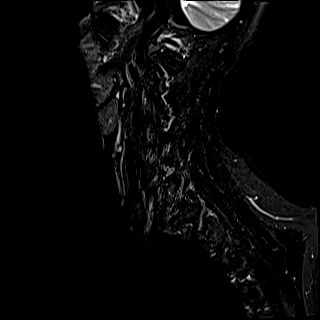
[im 3/15]
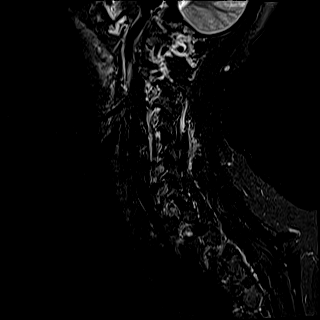
[im 5/15]
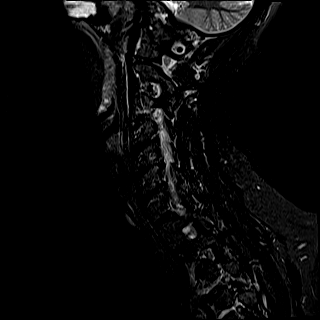
[im 8/15]
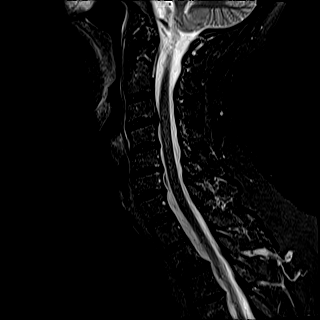
[im 10/15]
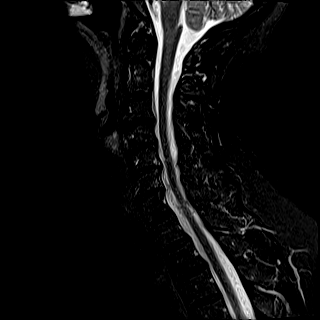
[im 12/15]
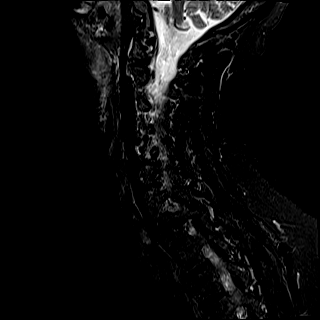
[im 15/15]
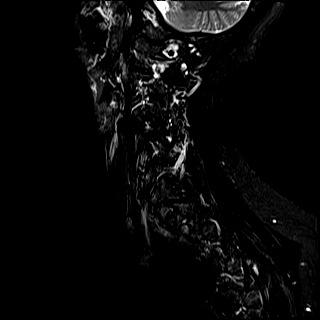

[Series 8: T2 · axial · 3.0mm · 0.70mm/px · z∈[-130,-32]mm · 10 of 29 slices shown (2 of 2)]
[im 1/29]
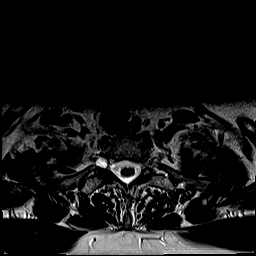
[im 3/29]
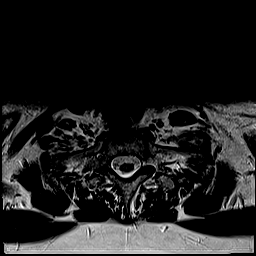
[im 5/29]
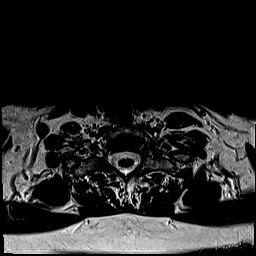
[im 7/29]
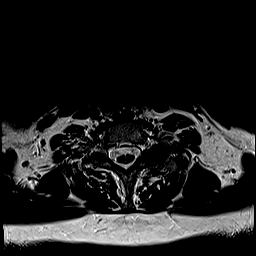
[im 9/29]
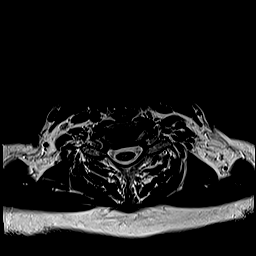
[im 13/29]
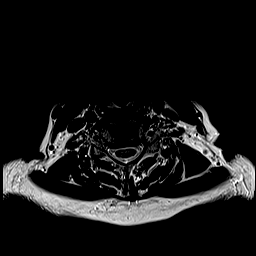
[im 16/29]
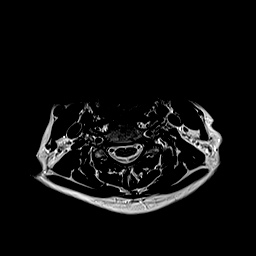
[im 20/29]
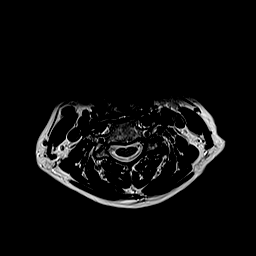
[im 24/29]
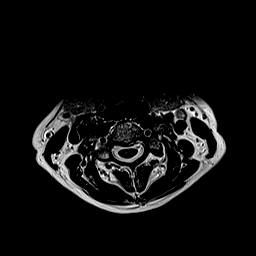
[im 29/29]
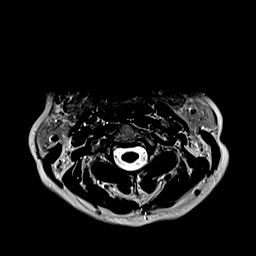

[Series 9: ax mpgr · axial · 3.0mm · 0.35mm/px · z∈[-130,-32]mm · 8 of 29 slices shown]
[im 1/29]
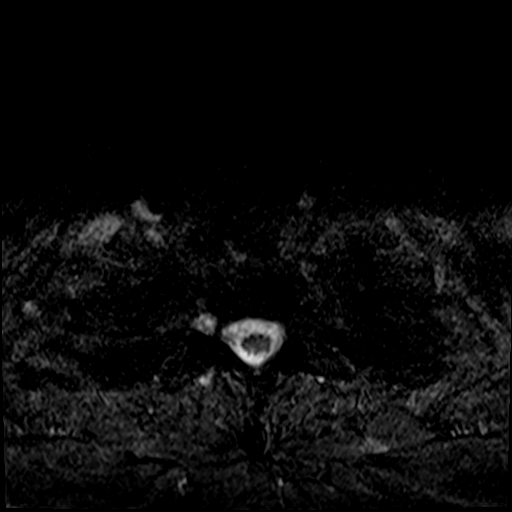
[im 5/29]
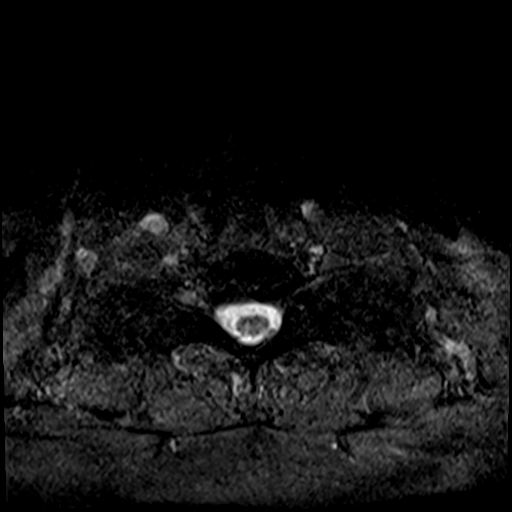
[im 9/29]
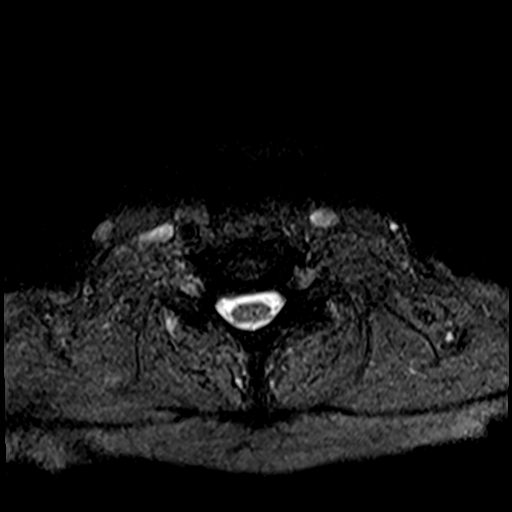
[im 13/29]
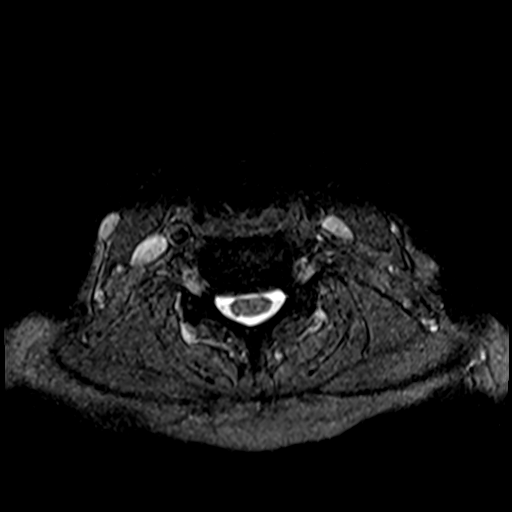
[im 16/29]
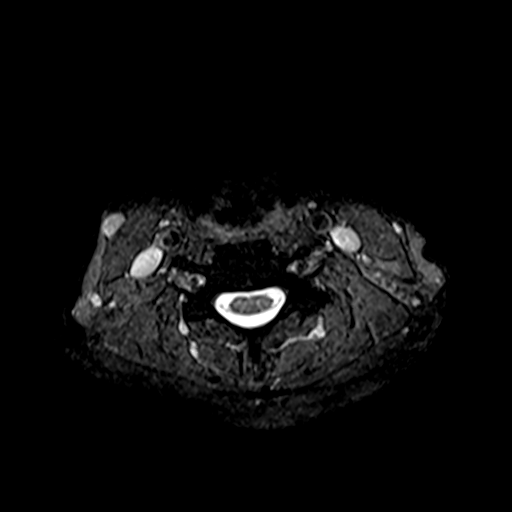
[im 20/29]
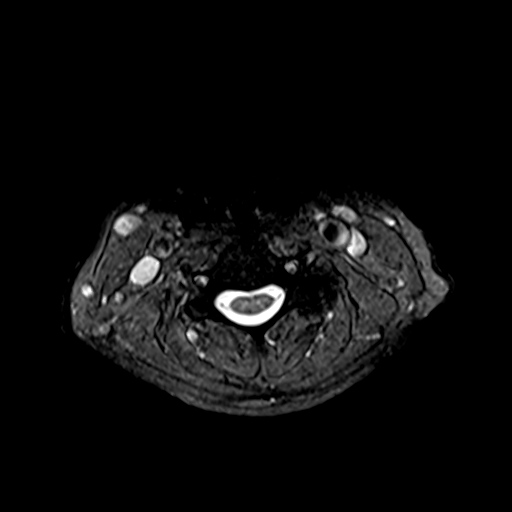
[im 24/29]
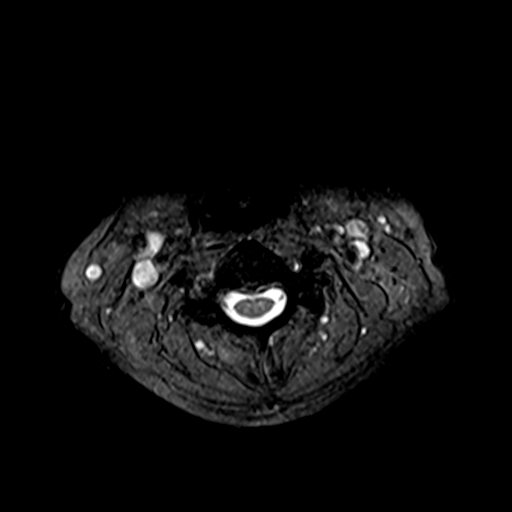
[im 29/29]
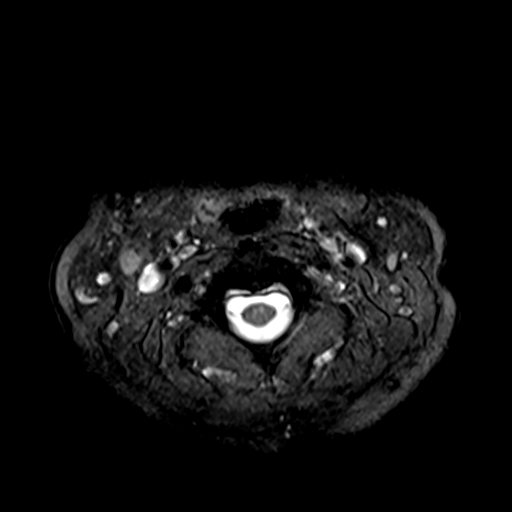

[38 of 48 positions shown; findings below may reference images not displayed]

FINDINGS: Alignment: Chronic straightening of cervical lordosis appears stable
since 6056. No significant spondylolisthesis.

Vertebrae: Faint degenerative posterior endplate marrow edema at
C5-C6. No other marrow edema or evidence of acute osseous
abnormality. Normal background bone marrow signal.

Cord: Spinal cord signal is within normal limits at all visualized
levels. Capacious spinal canal.

Posterior Fossa, vertebral arteries, paraspinal tissues:
Cervicomedullary junction is within normal limits. Negative visible
posterior fossa. Preserved major vascular flow voids in the neck.
Negative neck soft tissues. Negative visible lung apices.

Disc levels:

C2-C3:  Negative.

C3-C4: Broad-based central disc protrusion effaces the ventral CSF
space but there is no significant spinal stenosis. Mild facet
hypertrophy. Borderline to mild left C4 foraminal stenosis.

C4-C5: Minimal disc bulge and endplate spurring. Mild to moderate
left side facet hypertrophy. Mild left C5 foraminal stenosis.

C5-C6: Mild circumferential disc bulge and endplate spurring.
Foraminal involvement greater on the left. Moderate left and mild
right C6 foraminal stenosis.

C6-C7: Mild circumferential disc bulge and endplate spurring
eccentric to the right. Mild left and moderate right C7 foraminal
stenosis.

C7-T1:  Mild facet hypertrophy. No stenosis.

No upper thoracic spinal stenosis. Mild facet hypertrophy at T1-T2.
Mild disc bulging or small disc protrusions at T2-T3 and T3-T4.
IMPRESSION: 1. Widespread cervical disc degeneration but capacious spinal canal
with no spinal stenosis.
2. Up to moderate neural foraminal stenosis at the left C6 and right
C7 nerve levels.

## 2021-08-20 ENCOUNTER — Ambulatory Visit (INDEPENDENT_AMBULATORY_CARE_PROVIDER_SITE_OTHER): Payer: Medicare Other | Admitting: Family Medicine

## 2021-08-20 ENCOUNTER — Encounter: Payer: Self-pay | Admitting: Family Medicine

## 2021-08-20 ENCOUNTER — Other Ambulatory Visit: Payer: Self-pay

## 2021-08-20 VITALS — BP 119/75 | HR 76 | Ht 65.0 in | Wt 170.4 lb

## 2021-08-20 DIAGNOSIS — M792 Neuralgia and neuritis, unspecified: Secondary | ICD-10-CM | POA: Diagnosis not present

## 2021-08-20 DIAGNOSIS — F1320 Sedative, hypnotic or anxiolytic dependence, uncomplicated: Secondary | ICD-10-CM | POA: Diagnosis not present

## 2021-08-20 DIAGNOSIS — M62838 Other muscle spasm: Secondary | ICD-10-CM

## 2021-08-20 DIAGNOSIS — G894 Chronic pain syndrome: Secondary | ICD-10-CM | POA: Diagnosis not present

## 2021-08-20 DIAGNOSIS — M4802 Spinal stenosis, cervical region: Secondary | ICD-10-CM | POA: Diagnosis not present

## 2021-08-20 NOTE — Progress Notes (Signed)
Subjective:    Patient ID: Catherine Hughes, female    DOB: 07/25/1951, 70 y.o.   MRN: [id number]  Catherine Hughes is a 70 y.o. female presenting on 08/20/2021 for Establish Care   HPI  Previously with Dr Jeananne Rama at [facility] for 22 years, now he has retired and has seen other providers in the interval. Currently followed by PCP at [facility].  Chronic Pain Syndrome Cervical Spinal Stenosis Chronic Neck Pain  Tingling and pins and needles, paresthesia bilateral hands and feet Diagnosed with osteoporosis and C4-C5 DDD with cervical paraspinal spasm She has had history of unbearable pain due to this issue, has been on variety of medications Has had trial of injection series  She has been advised by friend who is a therapist, to try an anti-inflammatory diet.  She has modified her daily activities that involve her neck and lifting etc, and she has noticed significant dramatic improvement from pain 20 out of 10, all the way down to 5 out of 10.  Previous history of lumbar spinal injection >20 years ago  Previous PCP Dr Elie Confer [facility].  Has seen Psychiatry recently, has been on Xanax. They were discussing switching to Diazepam.  She has been on variety of medications over the years. Now currently improved on her medications Soma, Oxycodone, Alprazolam. Currently prescribed by her PCP. She was considering Pain Management and has upcoming appointment, but no longer plans to pursue injection   Depression screen [facility] 2/9 12/05/2019 07/10/2018 06/11/2018  Decreased Interest 0 0 0  Down, Depressed, Hopeless 0 0 0  PHQ - 2 Score 0 0 0  Altered sleeping 1 0 -  Tired, decreased energy 0 0 -  Change in appetite 0 0 -  Feeling bad or failure about yourself  0 0 -  Trouble concentrating 0 0 -  Moving slowly or fidgety/restless 0 0 -  Suicidal thoughts 0 0 -  PHQ-9 Score 1 0 -  Difficult doing work/chores Not difficult at all Not difficult at all -    Past Medical History:  Diagnosis  Date   Anxiety    Arthritis    Bipolar 1 disorder (HCC)    Depression    Fibromyalgia    Glaucoma    Menopausal state    Obesity (BMI 30.0-34.9)    Pneumonia    Past Surgical History:  Procedure Laterality Date   APPENDECTOMY     Open appendectomy (as a child)   BREAST SURGERY Bilateral implants and removal   CHOLECYSTECTOMY N/A 10/21/2017   Procedure: LAPAROSCOPIC CHOLECYSTECTOMY POSSIBLE OPEN;  Surgeon: Vickie Epley, MD;  Location: [facility] ORS;  Service: General;  Laterality: N/A;   COLONOSCOPY WITH PROPOFOL N/A 07/25/2017   Procedure: COLONOSCOPY WITH PROPOFOL;  Surgeon: Jonathon Bellows, MD;  Location: [facility] ENDOSCOPY;  Service: Gastroenterology;  Laterality: N/A;   FRACTURE SURGERY Right 2014   ankle   SPINE SURGERY  L4-5   fusion and coil surgery (anterior approach)   Social History   Socioeconomic History   Marital status: Married    Spouse name: paul   Number of children: 2   Years of education: cosmetology    Highest education level: Some college, no degree  Occupational History   Not on file  Tobacco Use   Smoking status: Former    Types: Cigarettes    Quit date: 03/18/1989    Years since quitting: 32.4   Smokeless tobacco: Never  Vaping Use   Vaping Use: Never used  Substance and  Sexual Activity   Alcohol use: Not Currently    Alcohol/week: 0.0 standard drinks    Comment: rarely   Drug use: No   Sexual activity: Not Currently  Other Topics Concern   Not on file  Social History Narrative   Not on file   Social Determinants of Health   Financial Resource Strain: Not on file  Food Insecurity: Not on file  Transportation Needs: Not on file  Physical Activity: Not on file  Stress: Not on file  Social Connections: Not on file  Intimate Partner Violence: Not on file   Family History  Problem Relation Age of Onset   Cancer Mother        colon   Diabetes Mother    Coronary artery disease Mother    Parkinson's disease Father    Anxiety disorder Father     Anxiety disorder Paternal Aunt    Kidney disease Neg Hx    Current Outpatient Medications on File Prior to Visit  Medication Sig   acyclovir ointment (ZOVIRAX) 5 % APPLY TOPICALLY AS DIRECTED TO THE AFFECTED AREA EVERY 3 HOURS   ALPRAZolam (XANAX) 1 MG tablet TAKE 1 TABLET(1 MG) BY MOUTH THREE TIMES DAILY AS NEEDED FOR ANXIETY   Ascorbic Acid (VITAMIN C) 1000 MG tablet Take 1,000 mg by mouth daily.   Cholecalciferol (VITAMIN D3) 2000 units capsule Take 1 capsule (2,000 Units total) by mouth daily.   Diphenhyd-Hydrocort-Nystatin (FIRST-DUKES MOUTHWASH) SUSP Use as directed 5 mLs in the mouth or throat 4 (four) times daily.   MAGNESIUM PO Take by mouth.   MILK THISTLE PO Take 1 Dose by mouth.   Multiple Vitamin (MULTI-VITAMINS) TABS Take by mouth.   vitamin B-12 (CYANOCOBALAMIN) 100 MCG tablet Take 100 mcg by mouth daily.   VITAMIN K PO Take by mouth daily.   No current facility-administered medications on file prior to visit.    Review of Systems Per HPI unless specifically indicated above      Objective:    BP 119/75   Pulse 76   Ht 5\' 5"  (1.651 m)   Wt 170 lb 6.4 oz (77.3 kg)   SpO2 99%   BMI 28.36 kg/m   Wt Readings from Last 3 Encounters:  08/20/21 170 lb 6.4 oz (77.3 kg)  11/28/18 174 lb (78.9 kg)  09/12/18 171 lb 6.4 oz (77.7 kg)    Physical Exam Vitals and nursing note reviewed.  Constitutional:      General: She is not in acute distress.    Appearance: Normal appearance. She is well-developed. She is not diaphoretic.     Comments: Well-appearing, comfortable, cooperative  HENT:     Head: Normocephalic and atraumatic.  Eyes:     General:        Right eye: No discharge.        Left eye: No discharge.     Conjunctiva/sclera: Conjunctivae normal.  Cardiovascular:     Rate and Rhythm: Normal rate.  Pulmonary:     Effort: Pulmonary effort is normal.  Skin:    General: Skin is warm and dry.     Findings: No erythema or rash.  Neurological:     Mental  Status: She is alert and oriented to person, place, and time.  Psychiatric:        Mood and Affect: Mood normal.        Behavior: Behavior normal.        Thought Content: Thought content normal.     Comments: Well groomed,  good eye contact, normal speech and thoughts     Results for orders placed or performed in visit on 07/19/19  T4, free  Result Value Ref Range   Free T4 1.16 0.82 - 1.77 ng/dL  T4  Result Value Ref Range   T4, Total 7.5 4.5 - 12.0 ug/dL  T3  Result Value Ref Range   T3, Total 137 71 - 180 ng/dL  TSH  Result Value Ref Range   TSH 2.810 0.450 - 4.500 uIU/mL  Folate  Result Value Ref Range   Folate >20.0 >3.0 ng/mL  Vitamin B12  Result Value Ref Range   Vitamin B-12 404 232 - 1,245 pg/mL      Assessment & Plan:   Problem List Items Addressed This Visit     Neurogenic pain (Chronic)   Chronic pain syndrome - Primary (Chronic)   Cervical paraspinal muscle spasm   Benzodiazepine dependence (HCC)   Other Visit Diagnoses     Cervical spinal stenosis           Reviewed patient's past history and current treatment We discussed her course of chronic pain over the years and current status, that has improved with lifestyle modification activity change and current medications Upon review of her current medications and review PDMP, ultimately we discussed the limitations in the scope of my practice in managing complicated chronic pain as the primary prescriber for the controlled substances that she is using and offered future Pain Management specialist involvement, however we mutually agreed that our office would not be an appropriate fit for managing her specific prescription needs at this time.  She will remain with her current PCP at this time. No new prescriptions or treatments ordered today.  No orders of the defined types were placed in this encounter.     Follow up plan: Return if symptoms worsen or fail to improve.  Nobie Putnam,  [facility] 08/20/2021, 3:27 PM

## 2021-08-20 NOTE — Patient Instructions (Addendum)
° °  Please schedule a Follow-up Appointment to: No follow-ups on file. ° °If you have any other questions or concerns, please feel free to call the office or send a message through MyChart. You may also schedule an earlier appointment if necessary. ° °Additionally, you may be receiving a survey about your experience at our office within a few days to 1 week by e-mail or mail. We value your feedback. ° °Stephenia Vogan, DO °[facility], [facility] °

## 2022-03-02 ENCOUNTER — Telehealth: Payer: Self-pay

## 2022-03-02 NOTE — Telephone Encounter (Signed)
CALLED PATIENT NO ANSWER LEFT VOICEMAIL FOR A CALL BACK ? ?

## 2022-03-03 ENCOUNTER — Other Ambulatory Visit: Payer: Self-pay

## 2022-03-03 DIAGNOSIS — Z86010 Personal history of colonic polyps: Secondary | ICD-10-CM

## 2022-03-03 MED ORDER — NA SULFATE-K SULFATE-MG SULF 17.5-3.13-1.6 GM/177ML PO SOLN: 1.0000 | Freq: Once | ORAL | 0 refills | Status: AC

## 2022-03-03 NOTE — Progress Notes (Signed)
Gastroenterology Pre-Procedure Review  Request Date: 08/05/2022 Requesting Physician: Dr. Vicente Males  PATIENT REVIEW QUESTIONS: The patient responded to the following health history questions as indicated:    1. Are you having any GI issues? no 2. Do you have a personal history of Polyps? yes (last colonoscopy ) 3. Do you have a family history of Colon Cancer or Polyps? yes (colon cancer) 4. Diabetes Mellitus? no 5. Joint replacements in the past 12 months?no 6. Major health problems in the past 3 months?no 7. Any artificial heart valves, MVP, or defibrillator?no    MEDICATIONS & ALLERGIES:    Patient reports the following regarding taking any anticoagulation/antiplatelet therapy:   Plavix, Coumadin, Eliquis, Xarelto, Lovenox, Pradaxa, Brilinta, or Effient? no Aspirin? no  Patient confirms/reports the following medications:  Current Outpatient Medications  Medication Sig Dispense Refill   acyclovir ointment (ZOVIRAX) 5 % APPLY TOPICALLY AS DIRECTED TO THE AFFECTED AREA EVERY 3 HOURS 5 g 0   ALPRAZolam (XANAX) 1 MG tablet TAKE 1 TABLET(1 MG) BY MOUTH THREE TIMES DAILY AS NEEDED FOR ANXIETY 270 tablet 1   Ascorbic Acid (VITAMIN C) 1000 MG tablet Take 1,000 mg by mouth daily.     Cholecalciferol (VITAMIN D3) 2000 units capsule Take 1 capsule (2,000 Units total) by mouth daily.     Diphenhyd-Hydrocort-Nystatin (FIRST-DUKES MOUTHWASH) SUSP Use as directed 5 mLs in the mouth or throat 4 (four) times daily. 237 mL 1   MAGNESIUM PO Take by mouth.     MILK THISTLE PO Take 1 Dose by mouth.     Multiple Vitamin (MULTI-VITAMINS) TABS Take by mouth.     vitamin B-12 (CYANOCOBALAMIN) 100 MCG tablet Take 100 mcg by mouth daily.     VITAMIN K PO Take by mouth daily.     No current facility-administered medications for this visit.    Patient confirms/reports the following allergies:  Allergies  Allergen Reactions   Tape Rash   Acetaminophen     thrush   Iodine Other (See Comments)    Pt states  "causes blisters"    Morphine     Other reaction(s): Other (See Comments) Cramps, tense   Morphine And Related     Morphine Contractions   Other Rash   Terfenadine Palpitations    No orders of the defined types were placed in this encounter.   AUTHORIZATION INFORMATION Primary Insurance: 1D#: Group #:  Secondary Insurance: 1D#: Group #:  SCHEDULE INFORMATION: Date: 08/05/2022 Time: Location:[facility]

## 2022-03-05 ENCOUNTER — Ambulatory Visit (HOSPITAL_COMMUNITY)
Admission: EM | Admit: 2022-03-05 | Discharge: 2022-03-05 | Disposition: A | Discharge status: Home / Self Care | Payer: Medicare Other | Attending: Internal Medicine | Admitting: Internal Medicine

## 2022-03-05 ENCOUNTER — Other Ambulatory Visit: Payer: Self-pay

## 2022-03-05 ENCOUNTER — Encounter (HOSPITAL_COMMUNITY): Payer: Self-pay | Admitting: *Deleted

## 2022-03-05 DIAGNOSIS — N12 Tubulo-interstitial nephritis, not specified as acute or chronic: Secondary | ICD-10-CM

## 2022-03-05 LAB — POCT URINALYSIS DIPSTICK, ED / UC
Bilirubin Urine: NEGATIVE
Glucose, UA: NEGATIVE mg/dL
Hgb urine dipstick: NEGATIVE
Ketones, ur: NEGATIVE mg/dL
Leukocytes,Ua: NEGATIVE
Nitrite: POSITIVE — AB
Protein, ur: NEGATIVE mg/dL
Specific Gravity, Urine: 1.01 (ref 1.005–1.030)
Urobilinogen, UA: 0.2 mg/dL (ref 0.0–1.0)
pH: 5.5 (ref 5.0–8.0)

## 2022-03-05 MED ORDER — CIPROFLOXACIN HCL 500 MG PO TABS
500.0000 mg | ORAL_TABLET | Freq: Two times a day (BID) | ORAL | 0 refills | Status: AC
Start: 2022-03-05 — End: 2022-03-12

## 2022-03-05 MED ORDER — LIDOCAINE HCL (PF) 1 % IJ SOLN
INTRAMUSCULAR | Status: AC
  Filled 2022-03-05: qty 2

## 2022-03-05 MED ORDER — CIPROFLOXACIN HCL 500 MG PO TABS
500.0000 mg | ORAL_TABLET | Freq: Two times a day (BID) | ORAL | 0 refills | Status: DC
Start: 2022-03-05 — End: 2022-03-05

## 2022-03-05 MED ORDER — CEFTRIAXONE SODIUM 1 G IJ SOLR
1.0000 g | Freq: Once | INTRAMUSCULAR | Status: AC
  Administered 2022-03-05: 1 g via INTRAMUSCULAR

## 2022-03-05 MED ORDER — CEFTRIAXONE SODIUM 1 G IJ SOLR
INTRAMUSCULAR | Status: AC
  Filled 2022-03-05: qty 10

## 2022-03-05 NOTE — ED Triage Notes (Signed)
PT reports lower ABD pain and back pain. Pt has completed anti-bx. Pt reports ABD pain worse since last night.

## 2022-03-05 NOTE — ED Provider Notes (Signed)
[facility]    CSN: [id number] Arrival date & time: 03/05/22  1654     History   Chief Complaint Chief Complaint  Patient presents with   Abdominal Pain   Back Pain    HPI Catherine Hughes is a 71 y.o. female.  Presents with 2-week history of dysuria and pelvic pressure.  States she called her primary care thinking she had UTI symptoms, they sent in an antibiotic for her which she finished 2 days ago.  Patient does not remember the name of this antibiotic, thinks it may have been Macrobid.  After finishing the course of antibiotic she has continued dysuria, frequency, and now complains of bilateral flank pain.  She has no fever no chills, no vomiting or diarrhea.  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   Fibromyositis 10/15/2019   Benzodiazepine dependence ([place]) 07/18/2019   Recurrent major depressive disorder, in partial remission ([place]) 07/18/2019   Numbness and tingling in both hands 07/18/2019   Elevated TSH 07/18/2019   Low back pain 12/10/2018   Knee pain, left 09/12/2018   Ankle pain, right 09/12/2018   Lymphedema 08/14/2018   Anxiety 05/10/2018   Intraabdominal mass    Chest pain 10/25/2017   Demand ischemia (HCC)    Chronic fatigue 06/28/2017   Insomnia 02/13/2017   Cervical paraspinal muscle spasm 02/13/2017   Chronic prescription benzodiazepine use 12/29/2015   Elevated C-reactive protein (CRP) 12/15/2015   Pelvic pain in female 12/14/2015   Lumbar facet syndrome 12/04/2015   Chronic lumbar radicular pain (Location of Secondary source of pain) (Bilateral) (L>R) (S1 Dermatome) 12/04/2015   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   Chronic headaches (Location of Tertiary source of pain) (Bilateral) (L>R) (distribution of the lesser occipital nerve) 12/04/2015   Fibromyalgia 12/04/2015   Neurogenic pain 12/04/2015   History of chronic fatigue syndrome 12/04/2015   Generalized anxiety disorder 12/04/2015   Lumbar facet joint syndrome 12/04/2015   Chronic pain syndrome 12/02/2015   Chronic low back pain (Location of Primary Source of Pain) (Bilateral) (L>R) 12/02/2015   Chronic lower extremity pain (Location of Secondary source of pain) (Bilateral) (L>R) 12/02/2015   Cystocele, grade 2 07/19/2015   Depression 07/13/2015   Atrophic vaginitis 06/22/2015   History of spinal fusion (L4-L5 and L5-S1) 03/19/2015    Past Surgical History:  Procedure Laterality Date   APPENDECTOMY     Open appendectomy (as a child)   BREAST SURGERY Bilateral implants and removal   CHOLECYSTECTOMY N/A 10/21/2017   Procedure: LAPAROSCOPIC CHOLECYSTECTOMY POSSIBLE OPEN;  Surgeon: Vickie Epley, MD;  Location: [facility] ORS;  Service: General;  Laterality: N/A;   COLONOSCOPY WITH PROPOFOL N/A 07/25/2017   Procedure: COLONOSCOPY WITH PROPOFOL;  Surgeon: Jonathon Bellows, MD;  Location: [facility] ENDOSCOPY;  Service: Gastroenterology;  Laterality: N/A;   FRACTURE SURGERY Right 2014   ankle   SPINE SURGERY  L4-5   fusion and coil surgery (anterior approach)    OB History   No obstetric history on file.      Home Medications  Prior to Admission medications   Medication Sig Start Date End Date Taking? Authorizing Provider  acyclovir ointment (ZOVIRAX) 5 % APPLY TOPICALLY AS DIRECTED TO THE AFFECTED AREA EVERY 3 HOURS 09/19/18   Guadalupe Maple, MD  ALPRAZolam (XANAX) 1 MG tablet TAKE 1 TABLET(1 MG) BY MOUTH THREE TIMES DAILY AS NEEDED FOR ANXIETY 07/30/19   Guadalupe Maple, MD  Ascorbic Acid (VITAMIN C) 1000 MG tablet Take 1,000 mg by mouth daily.    [provider]  Cholecalciferol (VITAMIN D3) 2000 units  capsule Take 1 capsule (2,000 Units total) by mouth daily. 03/10/16   [provider]  ciprofloxacin (CIPRO) 500 MG tablet Take 1 tablet (500 mg total) by mouth 2 (two) times daily for 7 days. 03/05/22 03/12/22  Whittney Steenson, Wells Guiles, PA-C  Diphenhyd-Hydrocort-Nystatin (FIRST-DUKES MOUTHWASH) SUSP Use as directed 5 mLs in the mouth or throat 4 (four) times daily. 09/03/18   Guadalupe Maple, MD  MAGNESIUM PO Take by mouth.    [provider]  MILK THISTLE PO Take 1 Dose by mouth.    [provider]  Multiple Vitamin (MULTI-VITAMINS) TABS Take by mouth.    [provider]  vitamin B-12 (CYANOCOBALAMIN) 100 MCG tablet Take 100 mcg by mouth daily.    [provider]  VITAMIN K PO Take by mouth daily.    [provider]    Family History Family History  Problem Relation Age of Onset   Cancer Mother        colon   Diabetes Mother    Coronary artery disease Mother    Parkinson's disease Father    Anxiety disorder Father    Anxiety disorder Paternal Aunt    Kidney disease Neg Hx     Social History Social History   Tobacco Use   Smoking status: Former    Types: Cigarettes    Quit date: 03/18/1989    Years since quitting: 32.9   Smokeless tobacco: Never  Vaping Use   Vaping Use: Never used  Substance Use Topics   Alcohol use: Not Currently    Alcohol/week: 0.0 standard drinks    Comment: rarely   Drug use: No     Allergies   Tape, Acetaminophen, Iodine, Morphine, Morphine and related, Other, and Terfenadine   Review of Systems Review of Systems  Genitourinary:  Positive for dysuria, flank pain and frequency.   As per HPI  Physical Exam Triage Vital Signs ED Triage Vitals  Enc Vitals Group     BP 03/05/22 1732 127/72     Pulse Rate 03/05/22 1732 (!) 48     Resp 03/05/22 1732 18     Temp 03/05/22 1732 97.9 F (36.6 C)     Temp src --      SpO2 03/05/22 1732 96 %     Weight --      Height --      Head Circumference --       Peak Flow --      Pain Score 03/05/22 1729 4     Pain Loc --      Pain Edu? --      Excl. in [place]? --    No data found.  Updated Vital Signs BP 127/72   Pulse (!) 48   Temp 97.9 F (36.6 C)   Resp 18   SpO2 96%    Physical Exam Vitals and nursing note reviewed.  Constitutional:      General: She is not in acute distress.  Appearance: She is not ill-appearing.  Eyes:     Conjunctiva/sclera: Conjunctivae normal.     Pupils: Pupils are equal, round, and reactive to light.  Cardiovascular:     Rate and Rhythm: Normal rate and regular rhythm.     Heart sounds: Normal heart sounds.  Pulmonary:     Effort: Pulmonary effort is normal.     Breath sounds: Normal breath sounds.  Abdominal:     General: Bowel sounds are normal.     Tenderness: There is abdominal tenderness in the suprapubic area. There is right CVA tenderness and left CVA tenderness.  Skin:    General: Skin is warm and dry.  Neurological:     Mental Status: She is alert and oriented to person, place, and time.    UC Treatments / Results  Labs (all labs ordered are listed, but only abnormal results are displayed) Labs Reviewed  POCT URINALYSIS DIPSTICK, ED / UC - Abnormal; Notable for the following components:      Result Value   Nitrite POSITIVE (*)    All other components within normal limits  URINE CULTURE   EKG  Radiology No results found.  Procedures Procedures (including critical care time)  Medications Ordered in UC Medications  cefTRIAXone (ROCEPHIN) injection 1 g (1 g Intramuscular Given 03/05/22 1851)    Initial Impression / Assessment and Plan / UC Course  I have reviewed the triage vital signs and the nursing notes.  Pertinent labs & imaging results that were available during my care of the patient were reviewed by me and considered in my medical decision making (see chart for details).  Urinalysis in clinic positive for nitrites.  Urine culture pending.  Due to patient physical  exam suspect her urinary tract infection may have started to spread to kidneys.  She is not ill-appearing and we will treat her outpatient for pyelonephritis.  Dose of Rocephin given in clinic today.  She will take Cipro twice daily for 7 days.  She is instructed to return or go to the emergency department if she develops fever, chills, vomiting/diarrhea, severe abdominal pain. Return precautions discussed. Patient agrees to plan and is discharged in stable condition.  Final Clinical Impressions(s) / UC Diagnoses   Final diagnoses:  Pyelonephritis     Discharge Instructions      Please take medication as prescribed.  Please return to the urgent care or emergency department if symptoms worsen or do not improve.    ED Prescriptions     Medication Sig Dispense Auth. Provider   ciprofloxacin (CIPRO) 500 MG tablet  (Status: Discontinued) Take 1 tablet (500 mg total) by mouth 2 (two) times daily for 7 days. 14 tablet Tanay Massiah, Kisha, PA-C   ciprofloxacin (CIPRO) 500 MG tablet Take 1 tablet (500 mg total) by mouth 2 (two) times daily for 7 days. 14 tablet Zahniya Zellars, Wells Guiles, PA-C      PDMP not reviewed this encounter.   Suella, Cogar 03/05/22 1911

## 2022-03-05 NOTE — ED Triage Notes (Signed)
Pt took Oxycodone and tylenol for ABD pain.

## 2022-03-05 NOTE — Discharge Instructions (Signed)
Please take medication as prescribed.  Please return to the urgent care or emergency department if symptoms worsen or do not improve.

## 2022-03-21 ENCOUNTER — Telehealth (HOSPITAL_COMMUNITY): Payer: Self-pay

## 2022-03-21 NOTE — Telephone Encounter (Signed)
Received call from pt- she reports having UTI symptoms after completing her course of antibiotic. I have advised patient is symptoms have returned she should be seen or follow up with her PCP. Pt voiced understanding at this time.

## 2022-03-22 ENCOUNTER — Telehealth: Payer: Self-pay

## 2022-03-22 NOTE — Telephone Encounter (Signed)
Resent paper work for patient

## 2022-04-11 ENCOUNTER — Telehealth: Payer: Self-pay

## 2022-04-11 ENCOUNTER — Ambulatory Visit: Payer: Medicare Other

## 2022-04-11 ENCOUNTER — Ambulatory Visit
Admission: RE | Admit: 2022-04-11 | Discharge: 2022-04-11 | Disposition: A | Discharge status: Home / Self Care | Payer: Medicare Other | Source: Ambulatory Visit | Attending: Emergency Medicine | Admitting: Emergency Medicine

## 2022-04-11 VITALS — BP 98/66 | HR 65 | Temp 98.6°F | Resp 18

## 2022-04-11 DIAGNOSIS — N390 Urinary tract infection, site not specified: Secondary | ICD-10-CM | POA: Diagnosis present

## 2022-04-11 LAB — POCT URINALYSIS DIP (MANUAL ENTRY)
Blood, UA: NEGATIVE
Glucose, UA: 100 mg/dL — AB
Nitrite, UA: POSITIVE — AB
Protein Ur, POC: 100 mg/dL — AB
Spec Grav, UA: <=1.005 — AB (ref 1.010–1.025)
Urobilinogen, UA: 4 E.U./dL — AB
pH, UA: 5 (ref 5.0–8.0)

## 2022-04-11 MED ORDER — CEPHALEXIN 500 MG PO CAPS: 500.0000 mg | ORAL_CAPSULE | Freq: Two times a day (BID) | ORAL | 0 refills | Status: DC

## 2022-04-11 MED ORDER — CEPHALEXIN 500 MG PO CAPS: 500.0000 mg | ORAL_CAPSULE | Freq: Two times a day (BID) | ORAL | 0 refills | Status: AC

## 2022-04-11 NOTE — ED Triage Notes (Signed)
Pt presents with complaints of recurrent UTI, reports 5th time in last 6 weeks but does not see where a culture has been done.

## 2022-04-14 ENCOUNTER — Other Ambulatory Visit: Payer: Self-pay | Admitting: Family Medicine

## 2022-04-14 LAB — URINE CULTURE: Culture: 10000 — AB

## 2022-04-14 MED ORDER — NITROFURANTOIN MONOHYD MACRO 100 MG PO CAPS: 100.0000 mg | ORAL_CAPSULE | Freq: Two times a day (BID) | ORAL | 0 refills | Status: DC

## 2022-04-14 NOTE — Telephone Encounter (Signed)
Per urine culture results, prescribing patient Macrobid 100 mg twice daily for 5 days. Will advise to follow-up with PCP if symptoms do not improve.

## 2022-07-12 ENCOUNTER — Telehealth: Payer: Self-pay

## 2022-07-12 NOTE — Telephone Encounter (Signed)
Patients colonoscopy has been rescheduled due to change in Dr. Georgeann Oppenheim schedule.  Colonoscopy has been rescheduled to 07/27/22.  New instructions have been sent via mychart.  Thanks,  [place], [place]

## 2022-07-26 ENCOUNTER — Encounter: Payer: Self-pay | Admitting: Gastroenterology

## 2022-07-27 ENCOUNTER — Encounter: Payer: Self-pay | Admitting: Gastroenterology

## 2022-07-27 ENCOUNTER — Ambulatory Visit: Payer: Medicare Other | Admitting: Anesthesiology

## 2022-07-27 ENCOUNTER — Other Ambulatory Visit: Payer: Self-pay

## 2022-07-27 ENCOUNTER — Encounter
Admission: RE | Disposition: A | Discharge status: Home / Self Care | Payer: Self-pay | Source: Ambulatory Visit | Attending: Gastroenterology

## 2022-07-27 ENCOUNTER — Ambulatory Visit
Admission: RE | Admit: 2022-07-27 | Discharge: 2022-07-27 | Disposition: A | Discharge status: Home / Self Care | Payer: Medicare Other | Source: Ambulatory Visit | Attending: Gastroenterology | Admitting: Gastroenterology

## 2022-07-27 DIAGNOSIS — Z860100 Personal history of colon polyps, unspecified: Secondary | ICD-10-CM

## 2022-07-27 DIAGNOSIS — Z1211 Encounter for screening for malignant neoplasm of colon: Secondary | ICD-10-CM | POA: Diagnosis present

## 2022-07-27 DIAGNOSIS — D126 Benign neoplasm of colon, unspecified: Secondary | ICD-10-CM

## 2022-07-27 DIAGNOSIS — Z87891 Personal history of nicotine dependence: Secondary | ICD-10-CM | POA: Insufficient documentation

## 2022-07-27 DIAGNOSIS — K5730 Diverticulosis of large intestine without perforation or abscess without bleeding: Secondary | ICD-10-CM | POA: Diagnosis not present

## 2022-07-27 DIAGNOSIS — F419 Anxiety disorder, unspecified: Secondary | ICD-10-CM | POA: Diagnosis not present

## 2022-07-27 DIAGNOSIS — D122 Benign neoplasm of ascending colon: Secondary | ICD-10-CM | POA: Insufficient documentation

## 2022-07-27 DIAGNOSIS — E669 Obesity, unspecified: Secondary | ICD-10-CM | POA: Diagnosis not present

## 2022-07-27 DIAGNOSIS — Z6824 Body mass index (BMI) 24.0-24.9, adult: Secondary | ICD-10-CM | POA: Insufficient documentation

## 2022-07-27 HISTORY — PX: COLONOSCOPY WITH PROPOFOL: SHX5780

## 2022-07-27 SURGERY — COLONOSCOPY WITH PROPOFOL
Anesthesia: General

## 2022-07-27 MED ORDER — SODIUM CHLORIDE 0.9 % IV SOLN: INTRAVENOUS | Status: DC

## 2022-07-27 MED ORDER — PROPOFOL 10 MG/ML IV BOLUS
INTRAVENOUS | Status: DC | PRN
  Administered 2022-07-27: 70 mg via INTRAVENOUS

## 2022-07-27 MED ORDER — DEXMEDETOMIDINE HCL IN NACL 200 MCG/50ML IV SOLN
INTRAVENOUS | Status: DC | PRN
  Administered 2022-07-27: 12 ug via INTRAVENOUS

## 2022-07-27 MED ORDER — LIDOCAINE HCL (CARDIAC) PF 100 MG/5ML IV SOSY
PREFILLED_SYRINGE | INTRAVENOUS | Status: DC | PRN
  Administered 2022-07-27: 50 mg via INTRAVENOUS

## 2022-07-27 MED ORDER — PROPOFOL 500 MG/50ML IV EMUL
INTRAVENOUS | Status: DC | PRN
  Administered 2022-07-27: 150 ug/kg/min via INTRAVENOUS

## 2022-07-27 NOTE — Transfer of Care (Signed)
Immediate Anesthesia Transfer of Care Note  Patient: Catherine Hughes  Procedure(s) Performed: COLONOSCOPY WITH PROPOFOL  Patient Location: PACU and Endoscopy Unit  Anesthesia Type:General  Level of Consciousness: drowsy  Airway & Oxygen Therapy: Patient Spontanous Breathing  Post-op Assessment: Report given to RN and Post -op Vital signs reviewed and stable  Post vital signs: Reviewed and stable  Last Vitals:  Vitals Value Taken Time  BP    Temp    Pulse    Resp    SpO2      Last Pain:  Vitals:   07/27/22 0807  TempSrc: Temporal  PainSc: 0-No pain         Complications: No notable events documented.

## 2022-07-27 NOTE — Anesthesia Postprocedure Evaluation (Signed)
Anesthesia Post Note  Patient: Catherine Hughes  Procedure(s) Performed: COLONOSCOPY WITH PROPOFOL  Patient location during evaluation: Endoscopy Anesthesia Type: General Level of consciousness: awake and alert Pain management: pain level controlled Vital Signs Assessment: post-procedure vital signs reviewed and stable Respiratory status: spontaneous breathing, nonlabored ventilation, respiratory function stable and patient connected to nasal cannula oxygen Cardiovascular status: blood pressure returned to baseline and stable Postop Assessment: no apparent nausea or vomiting Anesthetic complications: no   No notable events documented.   Last Vitals:  Vitals:   07/27/22 1021 07/27/22 1023  BP: (!) 97/57 (!) 102/58  Pulse: (!) 50 (!) 50  Resp: 16 18  Temp:    SpO2: 100% 99%    Last Pain:  Vitals:   07/27/22 1023  TempSrc:   PainSc: 0-No pain                 Precious Haws Abbegale Stehle

## 2022-07-27 NOTE — H&P (Signed)
Jonathon Bellows, MD [address] [place], [place], [place], [address] [address], [place], [place], [place], [address] Phone: [phone]  Fax: [phone]  Primary Care Physician:  Konrad Saha, MD   Pre-Procedure History & Physical: HPI:  Catherine Hughes is a 71 y.o. female is here for an colonoscopy.   Past Medical History:  Diagnosis Date   Anxiety    Arthritis    Bipolar 1 disorder ([place])    Depression    Fibromyalgia    Glaucoma    Menopausal state    Obesity (BMI 30.0-34.9)    Pneumonia     Past Surgical History:  Procedure Laterality Date   APPENDECTOMY     Open appendectomy (as a child)   BREAST SURGERY Bilateral implants and removal   CHOLECYSTECTOMY N/A 10/21/2017   Procedure: LAPAROSCOPIC CHOLECYSTECTOMY POSSIBLE OPEN;  Surgeon: Vickie Epley, MD;  Location: [facility] ORS;  Service: General;  Laterality: N/A;   COLONOSCOPY WITH PROPOFOL N/A 07/25/2017   Procedure: COLONOSCOPY WITH PROPOFOL;  Surgeon: Jonathon Bellows, MD;  Location: [facility] ENDOSCOPY;  Service: Gastroenterology;  Laterality: N/A;   FRACTURE SURGERY Right 2014   ankle   SPINE SURGERY  L4-5   fusion and coil surgery (anterior approach)    Prior to Admission medications   Medication Sig Start Date End Date Taking? Authorizing Provider  Ascorbic Acid (VITAMIN C) 1000 MG tablet Take 1,000 mg by mouth daily.   Yes [provider]  carisoprodol (SOMA) 350 MG tablet Take 350 mg by mouth 4 (four) times daily as needed for muscle spasms.   Yes [provider]  Cholecalciferol (VITAMIN D3) 2000 units capsule Take 1 capsule (2,000 Units total) by mouth daily. 03/10/16  Yes [provider]  diazepam (VALIUM) 10 MG tablet Take 10 mg by mouth every 6 (six) hours as needed for anxiety.   Yes [provider]  MAGNESIUM PO Take by mouth.   Yes [provider]  MILK THISTLE PO Take 1 Dose by mouth.   Yes [provider]  Multiple Vitamin (MULTI-VITAMINS) TABS  Take by mouth.   Yes [provider]  oxyCODONE (OXY IR/ROXICODONE) 5 MG immediate release tablet Take 5 mg by mouth every 4 (four) hours as needed for severe pain.   Yes [provider]  vitamin B-12 (CYANOCOBALAMIN) 100 MCG tablet Take 100 mcg by mouth daily.   Yes [provider]  VITAMIN K PO Take by mouth daily.   Yes [provider]  acyclovir ointment (ZOVIRAX) 5 % APPLY TOPICALLY AS DIRECTED TO THE AFFECTED AREA EVERY 3 HOURS 09/19/18   Crissman, Jeannette How, MD  ALPRAZolam (XANAX) 1 MG tablet TAKE 1 TABLET(1 MG) BY MOUTH THREE TIMES DAILY AS NEEDED FOR ANXIETY Patient not taking: Reported on 07/27/2022 07/30/19   Guadalupe Maple, MD  Diphenhyd-Hydrocort-Nystatin (FIRST-DUKES MOUTHWASH) SUSP Use as directed 5 mLs in the mouth or throat 4 (four) times daily. 09/03/18   Guadalupe Maple, MD  nitrofurantoin, macrocrystal-monohydrate, (MACROBID) 100 MG capsule Take 1 capsule (100 mg total) by mouth 2 (two) times daily. Patient not taking: Reported on 07/27/2022 04/14/22   Scot Jun, FNP    Allergies as of 03/03/2022 - Review Complete 03/03/2022  Allergen Reaction Noted   Tape Rash 12/02/2015   Acetaminophen  07/16/2018   Iodine Other (See Comments) 10/21/2017   Morphine  12/21/2016   Morphine and related  12/14/2015   Other Rash 12/21/2016   Terfenadine Palpitations 12/21/2016  Family History  Problem Relation Age of Onset   Cancer Mother        colon   Diabetes Mother    Coronary artery disease Mother    Parkinson's disease Father    Anxiety disorder Father    Anxiety disorder Paternal Aunt    Kidney disease Neg Hx     Social History   Socioeconomic History   Marital status: Married    Spouse name: paul   Number of children: 2   Years of education: cosmetology    Highest education level: Some college, no degree  Occupational History   Not on file  Tobacco Use   Smoking status: Former    Types: Cigarettes    Quit date:  03/18/1989    Years since quitting: 33.3   Smokeless tobacco: Never  Vaping Use   Vaping Use: Never used  Substance and Sexual Activity   Alcohol use: Not Currently    Alcohol/week: 0.0 standard drinks of alcohol    Comment: rarely   Drug use: No   Sexual activity: Not Currently  Other Topics Concern   Not on file  Social History Narrative   Not on file   Social Determinants of Health   Financial Resource Strain: Low Risk  (06/11/2018)   Overall Financial Resource Strain (CARDIA)    Difficulty of Paying Living Expenses: Not hard at all  Food Insecurity: No Food Insecurity (06/11/2018)   Hunger Vital Sign    Worried About Running Out of Food in the Last Year: Never true    Ran Out of Food in the Last Year: Never true  Transportation Needs: Unmet Transportation Needs (07/16/2018)   PRAPARE - Transportation    Lack of Transportation (Medical): Yes    Lack of Transportation (Non-Medical): Yes  Physical Activity: Inactive (06/11/2018)   Exercise Vital Sign    Days of Exercise per Week: 0 days    Minutes of Exercise per Session: 0 min  Stress: Stress Concern Present (07/16/2018)   [place]    Feeling of Stress : Very much  Social Connections: Somewhat Isolated (07/16/2018)   Social Connection and Isolation Panel [NHANES]    Frequency of Communication with Friends and Family: More than three times a week    Frequency of Social Gatherings with Friends and Family: More than three times a week    Attends Religious Services: Never    Marine scientist or Organizations: No    Attends Archivist Meetings: Never    Marital Status: Married  Human resources officer Violence: Not At Risk (06/11/2018)   Humiliation, Afraid, Rape, and Kick questionnaire    Fear of Current or Ex-Partner: No    Emotionally Abused: No    Physically Abused: No    Sexually Abused: No    Review of Systems: See HPI, otherwise negative  ROS  Physical Exam: BP 111/74   Pulse 81   Temp (!) 96.8 F (36 C) (Temporal)   Resp 20   Ht '5\' 5"'$  (1.651 m)   Wt 65.8 kg   SpO2 99%   BMI 24.13 kg/m  General:   Alert,  pleasant and cooperative in NAD Head:  Normocephalic and atraumatic. Neck:  Supple; no masses or thyromegaly. Lungs:  Clear throughout to auscultation, normal respiratory effort.    Heart:  +S1, +S2, Regular rate and rhythm, No edema. Abdomen:  Soft, nontender and nondistended. Normal bowel sounds, without guarding, and without rebound.   Neurologic:  Alert and  oriented x4;  grossly normal neurologically.  Impression/Plan: Catherine Hughes is here for an colonoscopy to be performed for surveillance due to prior history of colon polyps   Risks, benefits, limitations, and alternatives regarding  colonoscopy have been reviewed with the patient.  Questions have been answered.  All parties agreeable.   Jonathon Bellows, MD  07/27/2022, 8:21 AM

## 2022-07-27 NOTE — Op Note (Signed)
[facility] Gastroenterology Patient Name: Catherine Hughes Procedure Date: 07/27/2022 8:11 AM MRN: [id number] Account #: [id number] Date of Birth: November 14, 1950 Admit Type: Outpatient Age: 71 Room: [facility] ENDO ROOM 3 Gender: Female Note Status: Finalized Instrument Name: Park Meo [id number] Procedure:             Colonoscopy Indications:           Surveillance: Personal history of adenomatous polyps                         on last colonoscopy 5 years ago Providers:             Jonathon Bellows MD, MD Referring MD:          No Local Md, MD (Referring MD) Medicines:             Monitored Anesthesia Care Complications:         No immediate complications. Procedure:             Pre-Anesthesia Assessment:                        - Prior to the procedure, a History and Physical was                         performed, and patient medications, allergies and                         sensitivities were reviewed. The patient's tolerance                         of previous anesthesia was reviewed.                        - The risks and benefits of the procedure and the                         sedation options and risks were discussed with the                         patient. All questions were answered and informed                         consent was obtained.                        - ASA Grade Assessment: II - A patient with mild                         systemic disease.                        After obtaining informed consent, the colonoscope was                         passed under direct vision. Throughout the procedure,                         the patient's blood pressure, pulse, and oxygen  saturations were monitored continuously. The                         Colonoscope was introduced through the anus and                         advanced to the the cecum, identified by the                         appendiceal orifice. The Colonoscope was  introduced                         through the anus and advanced to the the cecum,                         identified by the appendiceal orifice. The colonoscopy                         was performed with ease. The patient tolerated the                         procedure well. The quality of the bowel preparation                         was poor. Findings:      The perianal and digital rectal examinations were normal.      A 5 mm polyp was found in the ascending colon. The polyp was sessile.       The polyp was removed with a cold snare. Resection and retrieval were       complete.      Multiple small-mouthed diverticula were found in the sigmoid colon.      A large amount of semi-liquid stool was found in the entire colon,       interfering with visualization. Impression:            - Preparation of the colon was poor.                        - One 5 mm polyp in the ascending colon, removed with                         a cold snare. Resected and retrieved.                        - Diverticulosis in the sigmoid colon.                        - Stool in the entire examined colon. Recommendation:        - Discharge patient to home (with escort).                        - Resume previous diet.                        - Continue present medications.                        - Await pathology results.                        -  Repeat colonoscopy in 6 months because the bowel                         preparation was suboptimal. Procedure Code(s):     --- Professional ---                        [phone], Colonoscopy, flexible; with removal of                         tumor(s), polyp(s), or other lesion(s) by snare                         technique Diagnosis Code(s):     --- Professional ---                        Z86.010, Personal history of colonic polyps                        K63.5, Polyp of colon                        K57.30, Diverticulosis of large intestine without                          perforation or abscess without bleeding CPT copyright 2019 American Medical Association. All rights reserved. The codes documented in this report are preliminary and upon coder review may  be revised to meet current compliance requirements. Jonathon Bellows, MD Jonathon Bellows MD, MD 07/27/2022 9:41:23 AM This report has been signed electronically. Number of Addenda: 0 Note Initiated On: 07/27/2022 8:11 AM Scope Withdrawal Time: 0 hours 4 minutes 40 seconds  Total Procedure Duration: 0 hours 7 minutes 39 seconds  Estimated Blood Loss:  Estimated blood loss: none.      [facility]

## 2022-07-27 NOTE — Anesthesia Procedure Notes (Signed)
Procedure Name: MAC Date/Time: 07/27/2022 9:28 AM  Performed by: Biagio Borg, CRNAPre-anesthesia Checklist: Patient identified, Emergency Drugs available, Suction available, Patient being monitored and Timeout performed Patient Re-evaluated:Patient Re-evaluated prior to induction Oxygen Delivery Method: Nasal cannula Induction Type: IV induction Placement Confirmation: positive ETCO2 and CO2 detector

## 2022-07-27 NOTE — Anesthesia Preprocedure Evaluation (Signed)
Anesthesia Evaluation  Patient identified by MRN, date of birth, ID band Patient awake    Reviewed: Allergy & Precautions, NPO status , Patient's Chart, lab work & pertinent test results  History of Anesthesia Complications Negative for: history of anesthetic complications  Airway Mallampati: III  TM Distance: >3 FB Neck ROM: full    Dental  (+) Chipped, Poor Dentition, Missing, Implants   Pulmonary former smoker,    Pulmonary exam normal        Cardiovascular (-) angina(-) Past MI negative cardio ROS Normal cardiovascular exam     Neuro/Psych  Headaches, PSYCHIATRIC DISORDERS  Neuromuscular disease    GI/Hepatic negative GI ROS, Neg liver ROS, neg GERD  ,  Endo/Other  negative endocrine ROS  Renal/GU negative Renal ROS  negative genitourinary   Musculoskeletal   Abdominal   Peds  Hematology negative hematology ROS (+)   Anesthesia Other Findings Past Medical History: No date: Anxiety No date: Arthritis No date: Bipolar 1 disorder (HCC) No date: Depression No date: Fibromyalgia No date: Glaucoma No date: Menopausal state No date: Obesity (BMI 30.0-34.9) No date: Pneumonia  Past Surgical History: No date: APPENDECTOMY     Comment:  Open appendectomy (as a child) implants and removal: BREAST SURGERY; Bilateral 10/21/2017: CHOLECYSTECTOMY; N/A     Comment:  Procedure: LAPAROSCOPIC CHOLECYSTECTOMY POSSIBLE OPEN;                Surgeon: Vickie Epley, MD;  Location: [facility] ORS;                Service: General;  Laterality: N/A; 07/25/2017: COLONOSCOPY WITH PROPOFOL; N/A     Comment:  Procedure: COLONOSCOPY WITH PROPOFOL;  Surgeon: Jonathon Bellows, MD;  Location: [facility] ENDOSCOPY;  Service:               Gastroenterology;  Laterality: N/A; 2014: FRACTURE SURGERY; Right     Comment:  ankle L4-5: SPINE SURGERY     Comment:  fusion and coil surgery (anterior approach)  BMI    Body Mass Index: 24.13  kg/m      Reproductive/Obstetrics negative OB ROS                             Anesthesia Physical Anesthesia Plan  ASA: 2  Anesthesia Plan: General   Post-op Pain Management:    Induction: Intravenous  PONV Risk Score and Plan: Propofol infusion and TIVA  Airway Management Planned: Natural Airway and Nasal Cannula  Additional Equipment:   Intra-op Plan:   Post-operative Plan:   Informed Consent: I have reviewed the patients History and Physical, chart, labs and discussed the procedure including the risks, benefits and alternatives for the proposed anesthesia with the patient or authorized representative who has indicated his/her understanding and acceptance.     Dental Advisory Given  Plan Discussed with: Anesthesiologist, CRNA and Surgeon  Anesthesia Plan Comments: (Patient consented for risks of anesthesia including but not limited to:  - adverse reactions to medications - risk of airway placement if required - damage to eyes, teeth, lips or other oral mucosa - nerve damage due to positioning  - sore throat or hoarseness - Damage to heart, brain, nerves, lungs, other parts of body or loss of life  Patient voiced understanding.)        Anesthesia Quick Evaluation

## 2022-07-28 ENCOUNTER — Encounter: Payer: Self-pay | Admitting: Gastroenterology

## 2022-07-28 LAB — SURGICAL PATHOLOGY

## 2022-08-09 ENCOUNTER — Telehealth: Payer: Self-pay

## 2022-08-09 ENCOUNTER — Ambulatory Visit
Admission: RE | Admit: 2022-08-09 | Discharge: 2022-08-09 | Disposition: A | Discharge status: Home / Self Care | Payer: Medicare Other | Source: Ambulatory Visit | Attending: Urgent Care | Admitting: Urgent Care

## 2022-08-09 VITALS — BP 117/73 | HR 61 | Temp 98.1°F | Resp 16

## 2022-08-09 DIAGNOSIS — R102 Pelvic and perineal pain: Secondary | ICD-10-CM | POA: Insufficient documentation

## 2022-08-09 DIAGNOSIS — N3000 Acute cystitis without hematuria: Secondary | ICD-10-CM | POA: Insufficient documentation

## 2022-08-09 DIAGNOSIS — R300 Dysuria: Secondary | ICD-10-CM | POA: Diagnosis present

## 2022-08-09 LAB — POCT URINALYSIS DIP (MANUAL ENTRY)
Bilirubin, UA: NEGATIVE
Blood, UA: NEGATIVE
Glucose, UA: NEGATIVE mg/dL
Ketones, POC UA: NEGATIVE mg/dL
Leukocytes, UA: NEGATIVE
Nitrite, UA: NEGATIVE
Protein Ur, POC: NEGATIVE mg/dL
Spec Grav, UA: 1.015 (ref 1.010–1.025)
Urobilinogen, UA: 0.2 E.U./dL
pH, UA: 5.5 (ref 5.0–8.0)

## 2022-08-09 MED ORDER — NITROFURANTOIN MONOHYD MACRO 100 MG PO CAPS: 100.0000 mg | ORAL_CAPSULE | Freq: Two times a day (BID) | ORAL | 0 refills | Status: AC

## 2022-08-09 NOTE — Telephone Encounter (Signed)
Patient called stating that she wanted to know more about her colonoscopy pathology report. I then called her back and explained her results and why she is needing to repeat her colonoscopy in 6 months. Patient understood that her colon was not cleaned out well and therefore, she is to do a 2 day prep. Patient had no further questions.

## 2022-08-09 NOTE — Discharge Instructions (Addendum)
Please start Macrobid to address an urinary tract infection. Make sure you hydrate very well with plain water and a quantity of 64 ounces of water a day.  Please limit drinks that are considered urinary irritants such as soda, sweet tea, coffee, energy drinks, alcohol.  These can worsen your urinary and genital symptoms but also be the source of them.  I will let you know about your urine culture results through MyChart to see if we need to prescribe or change your antibiotics based off of those results.  Keep your follow up with your urologist.

## 2022-08-09 NOTE — ED Provider Notes (Signed)
[facility]  Note:  This document was prepared using Systems analyst and may include unintentional dictation errors.  MRN: [id number] DOB: [birth year]  Subjective:   Catherine Hughes is a 71 y.o. female presenting for 1 day history of acute onset pelvic pressure, dysuria, urinary urgency.  Did an at-home test for UTI and was positive.  Has history of UTIs, sees urology.  Has not appointment with them in a week.  She is very concerned about getting the correct treatment as earlier this year she had 4 different visits without resolution of her symptoms and only 1 urine culture was run in the end.  She did well with Macrobid.  No current facility-administered medications for this encounter.  Current Outpatient Medications:    acyclovir ointment (ZOVIRAX) 5 %, APPLY TOPICALLY AS DIRECTED TO THE AFFECTED AREA EVERY 3 HOURS, Disp: 5 g, Rfl: 0   ALPRAZolam (XANAX) 1 MG tablet, TAKE 1 TABLET(1 MG) BY MOUTH THREE TIMES DAILY AS NEEDED FOR ANXIETY (Patient not taking: Reported on 07/27/2022), Disp: 270 tablet, Rfl: 1   Ascorbic Acid (VITAMIN C) 1000 MG tablet, Take 1,000 mg by mouth daily., Disp: , Rfl:    carisoprodol (SOMA) 350 MG tablet, Take 350 mg by mouth 4 (four) times daily as needed for muscle spasms., Disp: , Rfl:    Cholecalciferol (VITAMIN D3) 2000 units capsule, Take 1 capsule (2,000 Units total) by mouth daily., Disp: , Rfl:    diazepam (VALIUM) 10 MG tablet, Take 10 mg by mouth every 6 (six) hours as needed for anxiety., Disp: , Rfl:    Diphenhyd-Hydrocort-Nystatin (FIRST-DUKES MOUTHWASH) SUSP, Use as directed 5 mLs in the mouth or throat 4 (four) times daily., Disp: 237 mL, Rfl: 1   MAGNESIUM PO, Take by mouth., Disp: , Rfl:    MILK THISTLE PO, Take 1 Dose by mouth., Disp: , Rfl:    Multiple Vitamin (MULTI-VITAMINS) TABS, Take by mouth., Disp: , Rfl:    nitrofurantoin, macrocrystal-monohydrate, (MACROBID) 100 MG capsule, Take 1 capsule (100 mg  total) by mouth 2 (two) times daily. (Patient not taking: Reported on 07/27/2022), Disp: 10 capsule, Rfl: 0   oxyCODONE (OXY IR/ROXICODONE) 5 MG immediate release tablet, Take 5 mg by mouth every 4 (four) hours as needed for severe pain., Disp: , Rfl:    vitamin B-12 (CYANOCOBALAMIN) 100 MCG tablet, Take 100 mcg by mouth daily., Disp: , Rfl:    VITAMIN K PO, Take by mouth daily., Disp: , Rfl:    Allergies  Allergen Reactions   Tape Rash   Acetaminophen     thrush   Iodine Other (See Comments)    Pt states "causes blisters"    Morphine     Other reaction(s): Other (See Comments) Cramps, tense   Morphine And Related     Morphine Contractions   Other Rash   Terfenadine Palpitations    Past Medical History:  Diagnosis Date   Anxiety    Arthritis    Bipolar 1 disorder (HCC)    Depression    Fibromyalgia    Glaucoma    Menopausal state    Obesity (BMI 30.0-34.9)    Pneumonia      Past Surgical History:  Procedure Laterality Date   APPENDECTOMY     Open appendectomy (as a child)   BREAST SURGERY Bilateral implants and removal   CHOLECYSTECTOMY N/A 10/21/2017   Procedure: LAPAROSCOPIC CHOLECYSTECTOMY POSSIBLE OPEN;  Surgeon: Vickie Epley, MD;  Location: [facility] ORS;  Service: General;  Laterality: N/A;   COLONOSCOPY WITH PROPOFOL N/A 07/25/2017   Procedure: COLONOSCOPY WITH PROPOFOL;  Surgeon: Jonathon Bellows, MD;  Location: [facility] ENDOSCOPY;  Service: Gastroenterology;  Laterality: N/A;   COLONOSCOPY WITH PROPOFOL N/A 07/27/2022   Procedure: COLONOSCOPY WITH PROPOFOL;  Surgeon: Jonathon Bellows, MD;  Location: [facility] ENDOSCOPY;  Service: Gastroenterology;  Laterality: N/A;   FRACTURE SURGERY Right 2014   ankle   SPINE SURGERY  L4-5   fusion and coil surgery (anterior approach)    Family History  Problem Relation Age of Onset   Cancer Mother        colon   Diabetes Mother    Coronary artery disease Mother    Parkinson's disease Father    Anxiety disorder Father    Anxiety  disorder Paternal Aunt    Kidney disease Neg Hx     Social History   Tobacco Use   Smoking status: Former    Types: Cigarettes    Quit date: 03/18/1989    Years since quitting: 33.4   Smokeless tobacco: Never  Vaping Use   Vaping Use: Never used  Substance Use Topics   Alcohol use: Not Currently    Alcohol/week: 0.0 standard drinks of alcohol    Comment: rarely   Drug use: No    ROS   Objective:   Vitals: BP 117/73 (BP Location: Right Arm)   Pulse 61   Temp 98.1 F (36.7 C) (Oral)   Resp 16   SpO2 97%   Physical Exam Constitutional:      General: She is not in acute distress.    Appearance: Normal appearance. She is well-developed. She is not ill-appearing, toxic-appearing or diaphoretic.  HENT:     Head: Normocephalic and atraumatic.     Nose: Nose normal.     Mouth/Throat:     Mouth: Mucous membranes are moist.  Eyes:     General: No scleral icterus.       Right eye: No discharge.        Left eye: No discharge.     Extraocular Movements: Extraocular movements intact.  Cardiovascular:     Rate and Rhythm: Normal rate.  Pulmonary:     Effort: Pulmonary effort is normal.  Skin:    General: Skin is warm and dry.  Neurological:     General: No focal deficit present.     Mental Status: She is alert and oriented to person, place, and time.  Psychiatric:        Mood and Affect: Mood normal.        Behavior: Behavior normal.    Results for orders placed or performed during the hospital encounter of 08/09/22 (from the past 24 hour(s))  POCT urinalysis dipstick     Status: None   Collection Time: 08/09/22  4:36 PM  Result Value Ref Range   Color, UA yellow yellow   Clarity, UA clear clear   Glucose, UA negative negative mg/dL   Bilirubin, UA negative negative   Ketones, POC UA negative negative mg/dL   Spec Grav, UA 1.015 1.010 - 1.025   Blood, UA negative negative   pH, UA 5.5 5.0 - 8.0   Protein Ur, POC negative negative mg/dL   Urobilinogen, UA 0.2  0.2 or 1.0 E.U./dL   Nitrite, UA Negative Negative   Leukocytes, UA Negative Negative    Assessment and Plan :   PDMP not reviewed this encounter.  1. Acute cystitis without hematuria   2. Dysuria  3. Pelvic pressure in female     Start Macrobid to cover for acute cystitis, urine culture pending.  Recommended continued hydration, limiting urinary irritants. Counseled patient on potential for adverse effects with medications prescribed/recommended today, ER and return-to-clinic precautions discussed, patient verbalized understanding.    Jaynee Eagles, PA-C 08/09/22 1659

## 2022-08-09 NOTE — ED Triage Notes (Signed)
Patient presents to [facility] for possible UTI. Pt states abdominal pain and dysuria since yesterday. Took AZO test. Sees urology for UTI. Req culture.

## 2022-08-10 ENCOUNTER — Other Ambulatory Visit: Payer: Self-pay | Admitting: Otolaryngology

## 2022-08-10 DIAGNOSIS — R42 Dizziness and giddiness: Secondary | ICD-10-CM

## 2022-08-10 LAB — URINE CULTURE: Culture: NO GROWTH

## 2022-08-19 ENCOUNTER — Telehealth: Payer: Self-pay | Admitting: *Deleted

## 2022-08-19 NOTE — Telephone Encounter (Signed)
Patient left voicemail yesterday 08/18/2022 regarding wanting to schedule her colonoscopy in 6 months.  I have called patient back and left a detail message (per DPR) that we will send a reminder letter to have patient called office.

## 2022-09-06 ENCOUNTER — Inpatient Hospital Stay: Admission: RE | Admit: 2022-09-06 | Payer: Medicare Other | Source: Ambulatory Visit

## 2022-09-06 ENCOUNTER — Other Ambulatory Visit: Payer: Medicare Other

## 2022-10-06 ENCOUNTER — Ambulatory Visit
Admission: RE | Admit: 2022-10-06 | Discharge: 2022-10-06 | Disposition: A | Discharge status: Home / Self Care | Payer: Medicare Other | Source: Ambulatory Visit | Attending: Otolaryngology | Admitting: Otolaryngology

## 2022-10-06 DIAGNOSIS — R42 Dizziness and giddiness: Secondary | ICD-10-CM

## 2022-10-06 MED ORDER — GADOPICLENOL 0.5 MMOL/ML IV SOLN
7.0000 mL | Freq: Once | INTRAVENOUS | Status: AC | PRN
  Administered 2022-10-06: 7 mL via INTRAVENOUS

## 2022-11-01 ENCOUNTER — Ambulatory Visit: Payer: Self-pay | Admitting: Obstetrics and Gynecology

## 2022-11-21 ENCOUNTER — Other Ambulatory Visit: Payer: Self-pay | Admitting: *Deleted

## 2022-11-21 ENCOUNTER — Telehealth: Payer: Self-pay | Admitting: *Deleted

## 2022-11-21 DIAGNOSIS — Z86010 Personal history of colonic polyps: Secondary | ICD-10-CM

## 2022-11-21 MED ORDER — NA SULFATE-K SULFATE-MG SULF 17.5-3.13-1.6 GM/177ML PO SOLN: 2.0000 | Freq: Once | ORAL | 0 refills | Status: AC

## 2022-11-21 NOTE — Telephone Encounter (Signed)
Patient called to be schedule for a repeat colonoscopy due to the quality of the bowel preparation was poor in 07/2022.

## 2022-11-21 NOTE — Telephone Encounter (Deleted)
Gastroenterology Pre-Procedure Review  Request Date: *** Requesting Physician: Dr. Marland Kitchen  PATIENT REVIEW QUESTIONS: The patient responded to the following health history questions as indicated:    1. Are you having any GI issues? {Yes/No:19989} 2. Do you have a personal history of Polyps? {Yes/No:19989} 3. Do you have a family history of Colon Cancer or Polyps? {Yes/No:19989} 4. Diabetes Mellitus? {Yes/No:19989} 5. Joint replacements in the past 12 months?{Yes/No:19989} 6. Major health problems in the past 3 months?{Yes/No:19989} 7. Any artificial heart valves, MVP, or defibrillator?{Yes/No:19989}    MEDICATIONS & ALLERGIES:    Patient reports the following regarding taking any anticoagulation/antiplatelet therapy:   Plavix, Coumadin, Eliquis, Xarelto, Lovenox, Pradaxa, Brilinta, or Effient? {Yes/No:19989} Aspirin? {Yes/No:19989}  Patient confirms/reports the following medications:  Current Outpatient Medications  Medication Sig Dispense Refill   acyclovir ointment (ZOVIRAX) 5 % APPLY TOPICALLY AS DIRECTED TO THE AFFECTED AREA EVERY 3 HOURS 5 g 0   ALPRAZolam (XANAX) 1 MG tablet TAKE 1 TABLET(1 MG) BY MOUTH THREE TIMES DAILY AS NEEDED FOR ANXIETY (Patient not taking: Reported on 07/27/2022) 270 tablet 1   Ascorbic Acid (VITAMIN C) 1000 MG tablet Take 1,000 mg by mouth daily.     carisoprodol (SOMA) 350 MG tablet Take 350 mg by mouth 4 (four) times daily as needed for muscle spasms.     Cholecalciferol (VITAMIN D3) 2000 units capsule Take 1 capsule (2,000 Units total) by mouth daily.     diazepam (VALIUM) 10 MG tablet Take 10 mg by mouth every 6 (six) hours as needed for anxiety.     Diphenhyd-Hydrocort-Nystatin (FIRST-DUKES MOUTHWASH) SUSP Use as directed 5 mLs in the mouth or throat 4 (four) times daily. 237 mL 1   MAGNESIUM PO Take by mouth.     MILK THISTLE PO Take 1 Dose by mouth.     Multiple Vitamin (MULTI-VITAMINS) TABS Take by mouth.     nitrofurantoin,  macrocrystal-monohydrate, (MACROBID) 100 MG capsule Take 1 capsule (100 mg total) by mouth 2 (two) times daily. 10 capsule 0   oxyCODONE (OXY IR/ROXICODONE) 5 MG immediate release tablet Take 5 mg by mouth every 4 (four) hours as needed for severe pain.     vitamin B-12 (CYANOCOBALAMIN) 100 MCG tablet Take 100 mcg by mouth daily.     VITAMIN K PO Take by mouth daily.     No current facility-administered medications for this visit.    Patient confirms/reports the following allergies:  Allergies  Allergen Reactions   Tape Rash   Acetaminophen     thrush   Iodine Other (See Comments)    Pt states "causes blisters"    Morphine     Other reaction(s): Other (See Comments) Cramps, tense   Morphine And Related     Morphine Contractions   Other Rash   Terfenadine Palpitations    No orders of the defined types were placed in this encounter.   AUTHORIZATION INFORMATION Primary Insurance: 1D#: Group #:  Secondary Insurance: 1D#: Group #:  SCHEDULE INFORMATION: Date:  Time: Location:

## 2022-12-05 ENCOUNTER — Encounter: Payer: Self-pay | Admitting: *Deleted

## 2022-12-07 ENCOUNTER — Telehealth: Payer: Self-pay

## 2022-12-07 NOTE — Telephone Encounter (Signed)
Monina is covering for [employer], NP this week and this is here response:   Medina-Vargas, Monina C, NP  You7 minutes ago (1:58 PM)    I agree and will be reviewed when she gets seen.

## 2022-12-07 NOTE — Telephone Encounter (Signed)
Patient has a pending appointment for April 2024 with Dinah Ngetich,NP as a new patient to establish care and was transferred to clinical intake to ask about medications.  Patient explained that she is on Oxycodone 10 my twice daily, valium 10 mg daily and Soma (frequency not provided). Patient states she also needs to be on a sleep aid such as ambien due to sleeping issues.   Patient asked if our providers prescribe ambien, as she did not wish to waist time establishing with [place] if she was not going to be prescribed a sleep aid, such as Lorrin Mais, [place], or sonata. I informed patient that controlled substances are prescribed with caution and I could not guarantee that she would be prescribed a sleep aid as the providers have to take in consideration her current medications, diagnosis, age, and evaluate risk and benefits when prescribing ANY medications.   Patient injected by saying "But Lorrin Mais is not a controlled substance,"and I assure her that it is.   I added that if they do not believe that a medication is safe for a patient they will not prescribe it, as they take oaths to do what is in the best interest of the patients.   Patient concluded by saying, "Well  I'll keep my pending appointment and we will see how it goes."  Message will be shared with Dinah as a FYI.

## 2022-12-16 ENCOUNTER — Ambulatory Visit: Payer: Medicare Other

## 2022-12-17 ENCOUNTER — Ambulatory Visit: Payer: Medicare Other

## 2022-12-23 ENCOUNTER — Ambulatory Visit (INDEPENDENT_AMBULATORY_CARE_PROVIDER_SITE_OTHER): Payer: Medicare Other

## 2022-12-23 ENCOUNTER — Ambulatory Visit
Admission: RE | Admit: 2022-12-23 | Discharge: 2022-12-23 | Disposition: A | Discharge status: Home / Self Care | Payer: Medicare Other | Source: Ambulatory Visit | Attending: Emergency Medicine | Admitting: Emergency Medicine

## 2022-12-23 VITALS — BP 108/68 | HR 62 | Temp 98.6°F | Resp 16 | Ht 65.0 in | Wt 165.0 lb

## 2022-12-23 DIAGNOSIS — M79671 Pain in right foot: Secondary | ICD-10-CM

## 2022-12-23 NOTE — ED Provider Notes (Signed)
[facility]    CSN: [id number] Arrival date & time: 12/23/22  1501      History   Chief Complaint Chief Complaint  Patient presents with   Foot Pain    HPI Catherine Hughes is a 72 y.o. female.   HPI  72 year old female here for evaluation of right foot pain.  The patient has a past medical history that significant for bipolar 1 disorder, fibromyalgia, arthritis, glaucoma, and chronic low back and lower extremity pain.  She is presenting for evaluation of 2 months worth of off and on pain in her right heel on the outside.  She states that prior to the onset of the pain there was some broken glass in her kitchen and she was recently concerned that she may have a piece of glass in her foot.  She states that her husband looked and he could not find any glass or puncture wounds and the patient denies any bleeding.  She denies any other injury.  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   Benzodiazepine dependence ([place]) 07/18/2019   Recurrent major depressive disorder, in partial remission ([place]) 07/18/2019   Numbness and tingling in both hands 07/18/2019   Elevated TSH 07/18/2019   Low back pain 12/10/2018   Knee pain, left 09/12/2018   Ankle pain, right 09/12/2018   Lymphedema 08/14/2018   Anxiety 05/10/2018   Intraabdominal mass    Chest pain 10/25/2017   Demand ischemia    Chronic fatigue 06/28/2017   Insomnia 02/13/2017   Cervical paraspinal muscle spasm 02/13/2017   Chronic prescription benzodiazepine use 12/29/2015   Elevated C-reactive protein (CRP)  12/15/2015   Pelvic pain in female 12/14/2015   Lumbar facet syndrome 12/04/2015   Chronic lumbar radicular pain (Location of Secondary source of pain) (Bilateral) (L>R) (S1 Dermatome) 12/04/2015   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   Chronic headaches (Location of Tertiary source of pain) (Bilateral) (L>R) (distribution of the lesser occipital nerve) 12/04/2015   Fibromyalgia 12/04/2015   Neurogenic pain 12/04/2015   History of chronic fatigue syndrome 12/04/2015   Generalized anxiety disorder 12/04/2015   Lumbar facet joint syndrome 12/04/2015   Chronic pain syndrome 12/02/2015   Chronic low back pain (Location of Primary Source of Pain) (Bilateral) (L>R) 12/02/2015   Chronic lower extremity pain (Location of Secondary source of pain) (Bilateral) (L>R) 12/02/2015   Cystocele, grade 2 07/19/2015   Depression 07/13/2015   Atrophic vaginitis 06/22/2015   History of spinal fusion (L4-L5 and L5-S1) 03/19/2015    Past Surgical History:  Procedure Laterality Date   APPENDECTOMY     Open appendectomy (as a child)   BREAST SURGERY Bilateral implants and removal   CHOLECYSTECTOMY N/A 10/21/2017   Procedure: LAPAROSCOPIC CHOLECYSTECTOMY POSSIBLE OPEN;  Surgeon: Vickie Epley, MD;  Location: [facility] ORS;  Service: General;  Laterality: N/A;   COLONOSCOPY WITH PROPOFOL N/A 07/25/2017   Procedure: COLONOSCOPY WITH PROPOFOL;  Surgeon: Jonathon Bellows, MD;  Location:  [facility] ENDOSCOPY;  Service: Gastroenterology;  Laterality: N/A;   COLONOSCOPY WITH PROPOFOL N/A 07/27/2022   Procedure: COLONOSCOPY WITH PROPOFOL;  Surgeon: Jonathon Bellows, MD;  Location: [facility] ENDOSCOPY;  Service: Gastroenterology;  Laterality: N/A;   FRACTURE SURGERY Right 2014   ankle   SPINE SURGERY  L4-5   fusion and coil surgery (anterior approach)    OB History   No obstetric history on file.      Home Medications    Prior to Admission medications   Medication Sig Start Date End Date Taking?  Authorizing Provider  acyclovir ointment (ZOVIRAX) 5 % APPLY TOPICALLY AS DIRECTED TO THE AFFECTED AREA EVERY 3 HOURS 09/19/18  Yes Crissman, Jeannette How, MD  ALPRAZolam (XANAX) 1 MG tablet TAKE 1 TABLET(1 MG) BY MOUTH THREE TIMES DAILY AS NEEDED FOR ANXIETY 07/30/19  Yes Crissman, Jeannette How, MD  Ascorbic Acid (VITAMIN C) 1000 MG tablet Take 1,000 mg by mouth daily.   Yes [provider]  carisoprodol (SOMA) 350 MG tablet Take 350 mg by mouth 4 (four) times daily as needed for muscle spasms.   Yes [provider]  Cholecalciferol (VITAMIN D3) 2000 units capsule Take 1 capsule (2,000 Units total) by mouth daily. 03/10/16  Yes [provider]  diazepam (VALIUM) 10 MG tablet Take 10 mg by mouth every 6 (six) hours as needed for anxiety.   Yes [provider]  Diphenhyd-Hydrocort-Nystatin (FIRST-DUKES MOUTHWASH) SUSP Use as directed 5 mLs in the mouth or throat 4 (four) times daily. 09/03/18  Yes Crissman, Jeannette How, MD  MAGNESIUM PO Take by mouth.   Yes [provider]  MILK THISTLE PO Take 1 Dose by mouth.   Yes [provider]  Multiple Vitamin (MULTI-VITAMINS) TABS Take by mouth.   Yes [provider]  nitrofurantoin, macrocrystal-monohydrate, (MACROBID) 100 MG capsule Take 1 capsule (100 mg total) by mouth 2 (two) times daily. 08/09/22  Yes Jaynee Eagles, PA-C  oxyCODONE (OXY IR/ROXICODONE) 5 MG immediate release tablet Take 5 mg by mouth every 4 (four) hours as needed for severe pain.   Yes [provider]  vitamin B-12 (CYANOCOBALAMIN) 100 MCG tablet Take 100 mcg by mouth daily.   Yes [provider]  VITAMIN K PO Take by mouth daily.   Yes [provider]    Family History Family History  Problem Relation Age of Onset   Cancer Mother        colon   Diabetes Mother    Coronary artery disease Mother    Parkinson's disease Father    Anxiety disorder Father    Anxiety disorder Paternal Aunt    Kidney disease Neg Hx      Social History Social History   Tobacco Use   Smoking status: Former    Types: Cigarettes    Quit date: 03/18/1989    Years since quitting: 33.7   Smokeless tobacco: Never  Vaping Use   Vaping Use: Never used  Substance Use Topics   Alcohol use: Not Currently    Alcohol/week: 0.0 standard drinks of alcohol    Comment: rarely   Drug use: No     Allergies   Tape, Acetaminophen, Iodine, Morphine, Morphine and related, Other, and Terfenadine   Review of Systems Review of Systems  Musculoskeletal:  Positive for myalgias.       Pain in right heel.     Physical Exam Triage Vital Signs ED Triage Vitals  Enc Vitals Group     BP 12/23/22 1512 108/68  Pulse Rate 12/23/22 1512 62     Resp 12/23/22 1512 16     Temp 12/23/22 1512 98.6 F (37 C)     Temp Source 12/23/22 1512 Oral     SpO2 12/23/22 1512 96 %     Weight 12/23/22 1511 165 lb (74.8 kg)     Height 12/23/22 1511 '5\' 5"'$  (1.651 m)     Head Circumference --      Peak Flow --      Pain Score 12/23/22 1510 1     Pain Loc --      Pain Edu? --      Excl. in [place]? --    No data found.  Updated Vital Signs BP 108/68 (BP Location: Left Arm)   Pulse 62   Temp 98.6 F (37 C) (Oral)   Resp 16   Ht '5\' 5"'$  (1.651 m)   Wt 165 lb (74.8 kg)   SpO2 96%   BMI 27.46 kg/m   Visual Acuity Right Eye Distance:   Left Eye Distance:   Bilateral Distance:    Right Eye Near:   Left Eye Near:    Bilateral Near:     Physical Exam Vitals and nursing note reviewed.  Constitutional:      Appearance: Normal appearance.  Musculoskeletal:        General: No swelling, tenderness, deformity or signs of injury. Normal range of motion.  Skin:    General: Skin is warm and dry.     Capillary Refill: Capillary refill takes less than 2 seconds.     Findings: No bruising or erythema.  Neurological:     General: No focal deficit present.     Mental Status: She is alert and oriented to person, place, and time.      UC  Treatments / Results  Labs (all labs ordered are listed, but only abnormal results are displayed) Labs Reviewed - No data to display  EKG   Radiology DG Foot Complete Right  Result Date: 12/23/2022 CLINICAL DATA:  Pain no fall EXAM: RIGHT FOOT COMPLETE - 3 VIEW COMPARISON:  None Available. FINDINGS: There is no evidence of fracture or dislocation. There are mild degenerative changes in the midfoot and forefoot. Soft tissues are unremarkable. IMPRESSION: Mild degenerative changes in the midfoot and forefoot. Electronically Signed   By: Marin Roberts M.D.   On: 12/23/2022 15:38    Procedures Procedures (including critical care time)  Medications Ordered in UC Medications - No data to display  Initial Impression / Assessment and Plan / UC Course  I have reviewed the triage vital signs and the nursing notes.  Pertinent labs & imaging results that were available during my care of the patient were reviewed by me and considered in my medical decision making (see chart for details).   Patient is a nontoxic-appearing 72 year old female here for evaluation of off-and-on right heel pain.  She reports that the pain is on the posterior lateral aspect of the heel and is not there all the time.  Currently on exam the patient is pain-free.  There is no evidence of ecchymosis or erythema.  Also no edema.  DP and PT pulses are 2+.  The patient is concerned that she may have a heel spur that is causing this though the location is not typical of heel spur.  She has no pain with palpation of the calcaneus or with palpation of the plantar fascia.  I will obtain radiograph to rule out any bony abnormality.  Radiology impression states that there is no evidence of fracture or dislocation but there are mild degenerative changes in the midfoot and forefoot.  The degenerative changes are away from the area where patient is having pain.  I will refer her to podiatry for further evaluation to determine the root cause  of her intermittent pain in her right heel.  Final Clinical Impressions(s) / UC Diagnoses   Final diagnoses:  Foot pain, right     Discharge Instructions      Your x-rays did not demonstrate any heel spurs or any evidence of foreign body that may be causing your pain.  I am going to refer you to podiatry for further evaluation of your foot pain.  You can use over-the-counter Tylenol, Aleve, or ibuprofen as needed for pain.  You may also apply ice to your heel for 20 minutes at a time 2-3 times a day to see if this helps when your pain episodes occur     ED Prescriptions   None    PDMP not reviewed this encounter.   Margarette Canada, NP 12/23/22 1549

## 2022-12-23 NOTE — Discharge Instructions (Addendum)
Your x-rays did not demonstrate any heel spurs or any evidence of foreign body that may be causing your pain.  I am going to refer you to podiatry for further evaluation of your foot pain.  You can use over-the-counter Tylenol, Aleve, or ibuprofen as needed for pain.  You may also apply ice to your heel for 20 minutes at a time 2-3 times a day to see if this helps when your pain episodes occur.

## 2022-12-23 NOTE — ED Triage Notes (Signed)
Pt c/o R foot pain x2 mon, denies any falls or injuries. States PCP sent her here to have x-ray done. Pain mostly in heel which comes & goes.

## 2022-12-28 ENCOUNTER — Other Ambulatory Visit: Payer: Self-pay | Admitting: Student

## 2022-12-28 DIAGNOSIS — N63 Unspecified lump in unspecified breast: Secondary | ICD-10-CM

## 2022-12-30 ENCOUNTER — Ambulatory Visit: Payer: Medicare Other | Admitting: Family

## 2023-01-03 ENCOUNTER — Other Ambulatory Visit: Payer: Self-pay | Admitting: *Deleted

## 2023-01-03 ENCOUNTER — Inpatient Hospital Stay
Admission: RE | Admit: 2023-01-03 | Discharge: 2023-01-03 | Disposition: A | Discharge status: Home / Self Care | Payer: Self-pay | Source: Ambulatory Visit | Attending: *Deleted | Admitting: *Deleted

## 2023-01-03 DIAGNOSIS — Z1231 Encounter for screening mammogram for malignant neoplasm of breast: Secondary | ICD-10-CM

## 2023-01-04 ENCOUNTER — Encounter: Payer: Self-pay | Admitting: Gastroenterology

## 2023-01-05 ENCOUNTER — Encounter
Admission: RE | Disposition: A | Discharge status: Home / Self Care | Payer: Self-pay | Source: Ambulatory Visit | Attending: Gastroenterology

## 2023-01-05 ENCOUNTER — Other Ambulatory Visit: Payer: Self-pay

## 2023-01-05 ENCOUNTER — Encounter: Payer: Self-pay | Admitting: Gastroenterology

## 2023-01-05 ENCOUNTER — Ambulatory Visit: Payer: PRIVATE HEALTH INSURANCE | Admitting: General Practice

## 2023-01-05 ENCOUNTER — Ambulatory Visit
Admission: RE | Admit: 2023-01-05 | Discharge: 2023-01-05 | Disposition: A | Discharge status: Home / Self Care | Payer: PRIVATE HEALTH INSURANCE | Source: Ambulatory Visit | Attending: Gastroenterology | Admitting: Gastroenterology

## 2023-01-05 DIAGNOSIS — E669 Obesity, unspecified: Secondary | ICD-10-CM | POA: Diagnosis not present

## 2023-01-05 DIAGNOSIS — Z6824 Body mass index (BMI) 24.0-24.9, adult: Secondary | ICD-10-CM | POA: Insufficient documentation

## 2023-01-05 DIAGNOSIS — Z87891 Personal history of nicotine dependence: Secondary | ICD-10-CM | POA: Diagnosis not present

## 2023-01-05 DIAGNOSIS — Z9049 Acquired absence of other specified parts of digestive tract: Secondary | ICD-10-CM | POA: Insufficient documentation

## 2023-01-05 DIAGNOSIS — Z86010 Personal history of colonic polyps: Secondary | ICD-10-CM

## 2023-01-05 DIAGNOSIS — K621 Rectal polyp: Secondary | ICD-10-CM | POA: Insufficient documentation

## 2023-01-05 DIAGNOSIS — F319 Bipolar disorder, unspecified: Secondary | ICD-10-CM | POA: Diagnosis not present

## 2023-01-05 DIAGNOSIS — D122 Benign neoplasm of ascending colon: Secondary | ICD-10-CM | POA: Insufficient documentation

## 2023-01-05 DIAGNOSIS — F419 Anxiety disorder, unspecified: Secondary | ICD-10-CM | POA: Insufficient documentation

## 2023-01-05 DIAGNOSIS — J449 Chronic obstructive pulmonary disease, unspecified: Secondary | ICD-10-CM | POA: Diagnosis not present

## 2023-01-05 DIAGNOSIS — D126 Benign neoplasm of colon, unspecified: Secondary | ICD-10-CM | POA: Diagnosis not present

## 2023-01-05 DIAGNOSIS — Z1211 Encounter for screening for malignant neoplasm of colon: Secondary | ICD-10-CM | POA: Diagnosis present

## 2023-01-05 DIAGNOSIS — M797 Fibromyalgia: Secondary | ICD-10-CM | POA: Diagnosis not present

## 2023-01-05 HISTORY — PX: COLONOSCOPY WITH PROPOFOL: SHX5780

## 2023-01-05 SURGERY — COLONOSCOPY WITH PROPOFOL
Anesthesia: General

## 2023-01-05 MED ORDER — LIDOCAINE HCL (CARDIAC) PF 100 MG/5ML IV SOSY
PREFILLED_SYRINGE | INTRAVENOUS | Status: DC | PRN
  Administered 2023-01-05: 50 mg via INTRAVENOUS

## 2023-01-05 MED ORDER — PROPOFOL 500 MG/50ML IV EMUL
INTRAVENOUS | Status: DC | PRN
  Administered 2023-01-05: 130 ug/kg/min via INTRAVENOUS

## 2023-01-05 MED ORDER — SODIUM CHLORIDE 0.9 % IV SOLN: INTRAVENOUS | Status: DC

## 2023-01-05 MED ORDER — PROPOFOL 1000 MG/100ML IV EMUL
INTRAVENOUS | Status: AC
  Filled 2023-01-05: qty 200

## 2023-01-05 MED ORDER — PROPOFOL 10 MG/ML IV BOLUS
INTRAVENOUS | Status: DC | PRN
  Administered 2023-01-05: 60 mg via INTRAVENOUS

## 2023-01-05 NOTE — Transfer of Care (Signed)
Immediate Anesthesia Transfer of Care Note  Patient: Catherine Hughes  Procedure(s) Performed: COLONOSCOPY WITH PROPOFOL  Patient Location: Endoscopy Unit  Anesthesia Type:General  Level of Consciousness: drowsy  Airway & Oxygen Therapy: Patient Spontanous Breathing and Patient connected to face mask oxygen  Post-op Assessment: Report given to RN  Post vital signs: stable  Last Vitals:  Vitals Value Taken Time  BP    Temp    Pulse    Resp    SpO2      Last Pain:  Vitals:   01/05/23 0709  TempSrc: Temporal  PainSc: 0-No pain         Complications: No notable events documented.

## 2023-01-05 NOTE — Anesthesia Preprocedure Evaluation (Signed)
Anesthesia Evaluation  Patient identified by MRN, date of birth, ID band Patient awake    Reviewed: Allergy & Precautions, NPO status , Patient's Chart, lab work & pertinent test results  History of Anesthesia Complications Negative for: history of anesthetic complications  Airway Mallampati: III  TM Distance: >3 FB Neck ROM: full    Dental  (+) Chipped, Poor Dentition, Missing, Implants, Dental Advidsory Given   Pulmonary neg shortness of breath, COPD,  COPD inhaler, former smoker   Pulmonary exam normal        Cardiovascular (-) angina (-) Past MI and (-) CABG negative cardio ROS Normal cardiovascular exam     Neuro/Psych  Headaches PSYCHIATRIC DISORDERS       Neuromuscular disease    GI/Hepatic negative GI ROS, Neg liver ROS,neg GERD  ,,  Endo/Other  negative endocrine ROS    Renal/GU negative Renal ROS  negative genitourinary   Musculoskeletal   Abdominal   Peds  Hematology negative hematology ROS (+)   Anesthesia Other Findings Past Medical History: No date: Anxiety No date: Arthritis No date: Bipolar 1 disorder ([facility]) No date: Depression No date: Fibromyalgia No date: Glaucoma No date: Menopausal state No date: Obesity (BMI 30.0-34.9) No date: Pneumonia  Past Surgical History: No date: APPENDECTOMY     Comment:  Open appendectomy (as a child) implants and removal: BREAST SURGERY; Bilateral 10/21/2017: CHOLECYSTECTOMY; N/A     Comment:  Procedure: LAPAROSCOPIC CHOLECYSTECTOMY POSSIBLE OPEN;                Surgeon: Vickie Epley, MD;  Location: [facility] ORS;                Service: General;  Laterality: N/A; 07/25/2017: COLONOSCOPY WITH PROPOFOL; N/A     Comment:  Procedure: COLONOSCOPY WITH PROPOFOL;  Surgeon: Jonathon Bellows, MD;  Location: [facility] ENDOSCOPY;  Service:               Gastroenterology;  Laterality: N/A; 2014: FRACTURE SURGERY; Right     Comment:  ankle L4-5: SPINE SURGERY      Comment:  fusion and coil surgery (anterior approach)  BMI    Body Mass Index: 24.13 kg/m      Reproductive/Obstetrics negative OB ROS                             Anesthesia Physical Anesthesia Plan  ASA: 3  Anesthesia Plan: General   Post-op Pain Management: Minimal or no pain anticipated   Induction: Intravenous  PONV Risk Score and Plan: 3 and Propofol infusion and TIVA  Airway Management Planned: Natural Airway and Nasal Cannula  Additional Equipment: None  Intra-op Plan:   Post-operative Plan:   Informed Consent: I have reviewed the patients History and Physical, chart, labs and discussed the procedure including the risks, benefits and alternatives for the proposed anesthesia with the patient or authorized representative who has indicated his/her understanding and acceptance.     Dental Advisory Given  Plan Discussed with: Anesthesiologist, CRNA and Surgeon  Anesthesia Plan Comments: (Discussed risks of anesthesia with patient, including possibility of difficulty with spontaneous ventilation under anesthesia necessitating airway intervention, PONV, and rare risks such as cardiac or respiratory or neurological events, and allergic reactions. Discussed the role of CRNA in patient's perioperative care. Patient understands.)        Anesthesia Quick Evaluation

## 2023-01-05 NOTE — H&P (Signed)
Jonathon Bellows, MD [address], [place], [place], [place], [address] [address], [place], [place], [place], [address] Phone: [phone]  Fax: [phone]  Primary Care Physician:  Konrad Saha, MD   Pre-Procedure History & Physical: HPI:  Catherine Hughes is a 72 y.o. female is here for an colonoscopy.   Past Medical History:  Diagnosis Date   Anxiety    Arthritis    Bipolar 1 disorder ([place])    Depression    Fibromyalgia    Glaucoma    Menopausal state    Obesity (BMI 30.0-34.9)    Pneumonia     Past Surgical History:  Procedure Laterality Date   APPENDECTOMY     Open appendectomy (as a child)   BREAST SURGERY Bilateral implants and removal   CHOLECYSTECTOMY N/A 10/21/2017   Procedure: LAPAROSCOPIC CHOLECYSTECTOMY POSSIBLE OPEN;  Surgeon: Vickie Epley, MD;  Location: [facility] ORS;  Service: General;  Laterality: N/A;   COLONOSCOPY WITH PROPOFOL N/A 07/25/2017   Procedure: COLONOSCOPY WITH PROPOFOL;  Surgeon: Jonathon Bellows, MD;  [facility] ENDOSCOPY;  Service: Gastroenterology;  Laterality: N/A;   COLONOSCOPY WITH PROPOFOL N/A 07/27/2022   Procedure: COLONOSCOPY WITH PROPOFOL;  Surgeon: Jonathon Bellows, MD;  Location: [facility] ENDOSCOPY;  Service: Gastroenterology;  Laterality: N/A;   FRACTURE SURGERY Right 2014   ankle   SPINE SURGERY  L4-5   fusion and coil surgery (anterior approach)    Prior to Admission medications   Medication Sig Start Date End Date Taking? Authorizing Provider  acyclovir ointment (ZOVIRAX) 5 % APPLY TOPICALLY AS DIRECTED TO THE AFFECTED AREA EVERY 3 HOURS 09/19/18  Yes Crissman, Jeannette How, MD  ALPRAZolam (XANAX) 1 MG tablet TAKE 1 TABLET(1 MG) BY MOUTH THREE TIMES DAILY AS NEEDED FOR ANXIETY 07/30/19  Yes Crissman, Jeannette How, MD  Ascorbic Acid (VITAMIN C) 1000 MG tablet Take 1,000 mg by mouth daily.   Yes [provider]  carisoprodol (SOMA) 350 MG tablet Take 350 mg by mouth 4 (four) times daily as needed for muscle spasms.   Yes  [provider]  Cholecalciferol (VITAMIN D3) 2000 units capsule Take 1 capsule (2,000 Units total) by mouth daily. 03/10/16  Yes [provider]  diazepam (VALIUM) 10 MG tablet Take 10 mg by mouth every 6 (six) hours as needed for anxiety.   Yes [provider]  Diphenhyd-Hydrocort-Nystatin (FIRST-DUKES MOUTHWASH) SUSP Use as directed 5 mLs in the mouth or throat 4 (four) times daily. 09/03/18  Yes Crissman, Jeannette How, MD  MAGNESIUM PO Take by mouth.   Yes [provider]  MILK THISTLE PO Take 1 Dose by mouth.   Yes [provider]  Multiple Vitamin (MULTI-VITAMINS) TABS Take by mouth.   Yes [provider]  nitrofurantoin, macrocrystal-monohydrate, (MACROBID) 100 MG capsule Take 1 capsule (100 mg total) by mouth 2 (two) times daily. 08/09/22  Yes Jaynee Eagles, PA-C  oxyCODONE (OXY IR/ROXICODONE) 5 MG immediate release tablet Take 5 mg by mouth every 4 (four) hours as needed for severe pain.   Yes [provider]  vitamin B-12 (CYANOCOBALAMIN) 100 MCG tablet Take 100 mcg by mouth daily.   Yes [provider]  VITAMIN K PO Take by mouth daily.   Yes [provider]    Allergies as of 11/21/2022 - Review Complete 08/09/2022  Allergen Reaction Noted   Tape Rash 12/02/2015   Acetaminophen  07/16/2018   Iodine Other (See Comments) 10/21/2017   Morphine  12/21/2016  Morphine and related  12/14/2015   Other Rash 12/21/2016   Terfenadine Palpitations 12/21/2016    Family History  Problem Relation Age of Onset   Cancer Mother        colon   Diabetes Mother    Coronary artery disease Mother    Parkinson's disease Father    Anxiety disorder Father    Anxiety disorder Paternal Aunt    Kidney disease Neg Hx     Social History   Socioeconomic History   Marital status: Married    Spouse name: paul   Number of children: 2   Years of education: cosmetology    Highest education level: Some college, no degree   Occupational History   Not on file  Tobacco Use   Smoking status: Former    Types: Cigarettes    Quit date: 03/18/1989    Years since quitting: 33.8   Smokeless tobacco: Never  Vaping Use   Vaping Use: Never used  Substance and Sexual Activity   Alcohol use: Not Currently    Alcohol/week: 0.0 standard drinks of alcohol    Comment: rarely   Drug use: No   Sexual activity: Not Currently  Other Topics Concern   Not on file  Social History Narrative   Not on file   Social Determinants of Health   Financial Resource Strain: Low Risk  (06/11/2018)   Overall Financial Resource Strain (CARDIA)    Difficulty of Paying Living Expenses: Not hard at all  Food Insecurity: No Food Insecurity (06/11/2018)   Hunger Vital Sign    Worried About Running Out of Food in the Last Year: Never true    Ran Out of Food in the Last Year: Never true  Transportation Needs: Unmet Transportation Needs (07/16/2018)   PRAPARE - Transportation    Lack of Transportation (Medical): Yes    Lack of Transportation (Non-Medical): Yes  Physical Activity: Inactive (06/11/2018)   Exercise Vital Sign    Days of Exercise per Week: 0 days    Minutes of Exercise per Session: 0 min  Stress: Stress Concern Present (07/16/2018)   [employer]    Feeling of Stress : Very much  Social Connections: Somewhat Isolated (07/16/2018)   Social Connection and Isolation Panel [NHANES]    Frequency of Communication with Friends and Family: More than three times a week    Frequency of Social Gatherings with Friends and Family: More than three times a week    Attends Religious Services: Never    Marine scientist or Organizations: No    Attends Archivist Meetings: Never    Marital Status: Married  Human resources officer Violence: Not At Risk (06/11/2018)   Humiliation, Afraid, Rape, and Kick questionnaire    Fear of Current or Ex-Partner: No    Emotionally  Abused: No    Physically Abused: No    Sexually Abused: No    Review of Systems: See HPI, otherwise negative ROS  Physical Exam: BP (!) 150/74   Pulse 82   Temp (!) 96.4 F (35.8 C) (Temporal)   Resp 18   Ht 5\' 5"  (1.651 m)   Wt 66.2 kg   SpO2 97%   BMI 24.30 kg/m  General:   Alert,  pleasant and cooperative in NAD Head:  Normocephalic and atraumatic. Neck:  Supple; no masses or thyromegaly. Lungs:  Clear throughout to auscultation, normal respiratory effort.    Heart:  +S1, +S2, Regular rate and rhythm,  No edema. Abdomen:  Soft, nontender and nondistended. Normal bowel sounds, without guarding, and without rebound.   Neurologic:  Alert and  oriented x4;  grossly normal neurologically.  Impression/Plan: Catherine Hughes is here for an colonoscopy to be performed for Screening colonoscopy average risk   Risks, benefits, limitations, and alternatives regarding  colonoscopy have been reviewed with the patient.  Questions have been answered.  All parties agreeable.   Jonathon Bellows, MD  01/05/2023, 7:37 AM

## 2023-01-05 NOTE — Op Note (Signed)
[facility] Gastroenterology Patient Name: Catherine Hughes Procedure Date: 01/05/2023 7:37 AM MRN: [id number] Account #: [id number] Date of Birth: Feb 19, 1951 Admit Type: Outpatient Age: 72 Room: [facility] ENDO ROOM 3 Gender: Female Note Status: Finalized Instrument Name: Jasper Riling [id number] Procedure:             Colonoscopy Indications:           Screening for colorectal malignant neoplasm,                         inadequate bowel prep on last colonoscopy (more recent                         than 10 years ago) Providers:             Jonathon Bellows MD, MD Referring MD:          Elmer Ramp. Elie Confer (Referring MD) Medicines:             Monitored Anesthesia Care Complications:         No immediate complications. Procedure:             Pre-Anesthesia Assessment:                        - Prior to the procedure, a History and Physical was                         performed, and patient medications, allergies and                         sensitivities were reviewed. The patient's tolerance                         of previous anesthesia was reviewed.                        - The risks and benefits of the procedure and the                         sedation options and risks were discussed with the                         patient. All questions were answered and informed                         consent was obtained.                        - ASA Grade Assessment: II - A patient with mild                         systemic disease.                        After obtaining informed consent, the colonoscope was                         passed under direct vision. Throughout the procedure,                         the patient's blood  pressure, pulse, and oxygen                         saturations were monitored continuously. The                         Colonoscope was introduced through the anus and                         advanced to the the cecum, identified by the                          appendiceal orifice. The colonoscopy was performed                         with ease. The patient tolerated the procedure well.                         The quality of the bowel preparation was good. The                         ileocecal valve, appendiceal orifice, and rectum were                         photographed. Findings:      The perianal and digital rectal examinations were normal.      Two sessile polyps were found in the rectum and ascending colon. The       polyps were 5 to 6 mm in size. These polyps were removed with a cold       snare. Resection and retrieval were complete.      A 4 mm polyp was found in the ascending colon. The polyp was sessile.       The polyp was removed with a jumbo cold forceps. Resection and retrieval       were complete.      The exam was otherwise without abnormality on direct and retroflexion       views. Impression:            - Two 5 to 6 mm polyps in the rectum and in the                         ascending colon, removed with a cold snare. Resected                         and retrieved.                        - One 4 mm polyp in the ascending colon, removed with                         a jumbo cold forceps. Resected and retrieved.                        - The examination was otherwise normal on direct and                         retroflexion views. Recommendation:        - Discharge patient to home (with escort).                        -  Resume previous diet.                        - Continue present medications.                        - Await pathology results.                        - Repeat colonoscopy is not recommended due to current                         age (2 years or older) for surveillance. Procedure Code(s):     --- Professional ---                        [phone], Colonoscopy, flexible; with removal of                         tumor(s), polyp(s), or other lesion(s) by snare                         technique                         45380, 50, Colonoscopy, flexible; with biopsy, single                         or multiple Diagnosis Code(s):     --- Professional ---                        Z12.11, Encounter for screening for malignant neoplasm                         of colon                        D12.8, Benign neoplasm of rectum                        D12.2, Benign neoplasm of ascending colon CPT copyright 2022 American Medical Association. All rights reserved. The codes documented in this report are preliminary and upon coder review may  be revised to meet current compliance requirements. Jonathon Bellows, MD Jonathon Bellows MD, MD 01/05/2023 8:05:06 AM This report has been signed electronically. Number of Addenda: 0 Note Initiated On: 01/05/2023 7:37 AM Scope Withdrawal Time: 0 hours 12 minutes 50 seconds  Total Procedure Duration: 0 hours 20 minutes 23 seconds  Estimated Blood Loss:  Estimated blood loss: none.      [facility]

## 2023-01-05 NOTE — Anesthesia Postprocedure Evaluation (Signed)
Anesthesia Post Note  Patient: Catherine Hughes  Procedure(s) Performed: COLONOSCOPY WITH PROPOFOL  Patient location during evaluation: Endoscopy Anesthesia Type: General Level of consciousness: awake and alert Pain management: pain level controlled Vital Signs Assessment: post-procedure vital signs reviewed and stable Respiratory status: spontaneous breathing, nonlabored ventilation, respiratory function stable and patient connected to nasal cannula oxygen Cardiovascular status: blood pressure returned to baseline and stable Postop Assessment: no apparent nausea or vomiting Anesthetic complications: no  No notable events documented.   Last Vitals:  Vitals:   01/05/23 0816 01/05/23 0826  BP: 116/83 123/77  Pulse: 76 71  Resp: 16 14  Temp:    SpO2: 100% 100%    Last Pain:  Vitals:   01/05/23 0826  TempSrc:   PainSc: 0-No pain                 Dimas Millin

## 2023-01-06 ENCOUNTER — Encounter: Payer: Self-pay | Admitting: Gastroenterology

## 2023-01-06 LAB — SURGICAL PATHOLOGY

## 2023-01-10 ENCOUNTER — Ambulatory Visit: Payer: Medicare Other | Admitting: Podiatry

## 2023-01-11 ENCOUNTER — Ambulatory Visit
Admission: RE | Admit: 2023-01-11 | Discharge: 2023-01-11 | Disposition: A | Discharge status: Home / Self Care | Payer: PRIVATE HEALTH INSURANCE | Source: Ambulatory Visit | Attending: Student | Admitting: Student

## 2023-01-11 DIAGNOSIS — N63 Unspecified lump in unspecified breast: Secondary | ICD-10-CM | POA: Diagnosis not present

## 2023-01-11 DIAGNOSIS — N6489 Other specified disorders of breast: Secondary | ICD-10-CM | POA: Diagnosis not present

## 2023-01-11 DIAGNOSIS — Z1239 Encounter for other screening for malignant neoplasm of breast: Secondary | ICD-10-CM | POA: Insufficient documentation

## 2023-01-11 DIAGNOSIS — R92313 Mammographic fatty tissue density, bilateral breasts: Secondary | ICD-10-CM | POA: Insufficient documentation

## 2023-02-08 ENCOUNTER — Ambulatory Visit: Payer: Medicare Other | Admitting: Family

## 2023-02-16 ENCOUNTER — Ambulatory Visit: Payer: Medicare Other | Admitting: Nurse Practitioner

## 2023-03-28 ENCOUNTER — Encounter: Payer: Self-pay | Admitting: *Deleted

## 2023-03-28 ENCOUNTER — Emergency Department: Payer: Medicare Other

## 2023-03-28 ENCOUNTER — Other Ambulatory Visit: Payer: Self-pay

## 2023-03-28 ENCOUNTER — Emergency Department
Admission: EM | Admit: 2023-03-28 | Discharge: 2023-03-28 | Disposition: A | Discharge status: Home / Self Care | Payer: Medicare Other | Attending: Emergency Medicine | Admitting: Emergency Medicine

## 2023-03-28 DIAGNOSIS — R7989 Other specified abnormal findings of blood chemistry: Secondary | ICD-10-CM | POA: Insufficient documentation

## 2023-03-28 DIAGNOSIS — R079 Chest pain, unspecified: Secondary | ICD-10-CM | POA: Insufficient documentation

## 2023-03-28 LAB — BASIC METABOLIC PANEL
Anion gap: 9 (ref 5–15)
BUN: 18 mg/dL (ref 8–23)
CO2: 22 mmol/L (ref 22–32)
Calcium: 9.1 mg/dL (ref 8.9–10.3)
Chloride: 102 mmol/L (ref 98–111)
Creatinine, Ser: 0.73 mg/dL (ref 0.44–1.00)
GFR, Estimated: >60 mL/min (ref >60)
Glucose, Bld: 118 mg/dL — ABNORMAL HIGH (ref 70–99)
Potassium: 4 mmol/L (ref 3.5–5.1)
Sodium: 133 mmol/L — ABNORMAL LOW (ref 135–145)

## 2023-03-28 LAB — CBC
HCT: 38.9 % (ref 36.0–46.0)
Hemoglobin: 12.8 g/dL (ref 12.0–15.0)
MCH: 31.4 pg (ref 26.0–34.0)
MCHC: 32.9 g/dL (ref 30.0–36.0)
MCV: 95.6 fL (ref 80.0–100.0)
Platelets: 220 10*3/uL (ref 150–400)
RBC: 4.07 MIL/uL (ref 3.87–5.11)
RDW: 12.3 % (ref 11.5–15.5)
WBC: 9.8 10*3/uL (ref 4.0–10.5)
nRBC: 0 % (ref 0.0–0.2)

## 2023-03-28 LAB — TROPONIN I (HIGH SENSITIVITY)
Troponin I (High Sensitivity): 15 ng/L (ref <18)
Troponin I (High Sensitivity): 13 ng/L (ref <18)

## 2023-03-28 LAB — D-DIMER, QUANTITATIVE: D-Dimer, Quant: 1.35 ug/mL-FEU — ABNORMAL HIGH (ref 0.00–0.50)

## 2023-03-28 MED ORDER — MORPHINE SULFATE (PF) 4 MG/ML IV SOLN
4.0000 mg | Freq: Once | INTRAVENOUS | Status: AC
  Administered 2023-03-28: 4 mg via INTRAVENOUS
  Filled 2023-03-28: qty 1

## 2023-03-28 MED ORDER — IOHEXOL 350 MG/ML SOLN
75.0000 mL | Freq: Once | INTRAVENOUS | Status: AC | PRN
  Administered 2023-03-28: 75 mL via INTRAVENOUS

## 2023-03-28 MED ORDER — OXYCODONE HCL 5 MG PO TABS
5.0000 mg | ORAL_TABLET | Freq: Once | ORAL | Status: AC
  Administered 2023-03-28: 5 mg via ORAL
  Filled 2023-03-28: qty 1

## 2023-03-28 MED ORDER — LIDOCAINE 5 % EX PTCH: 1.0000 | MEDICATED_PATCH | Freq: Two times a day (BID) | CUTANEOUS | 0 refills | Status: AC

## 2023-03-28 MED ORDER — LIDOCAINE 5 % EX PTCH
1.0000 | MEDICATED_PATCH | CUTANEOUS | Status: DC
  Administered 2023-03-28: 1 via TRANSDERMAL
  Filled 2023-03-28: qty 1

## 2023-03-28 NOTE — ED Triage Notes (Signed)
Arrives via [facility] from home.  C?O nausea, and diaphoresis last night around 2300.  This morning at 0400, chest pain left side/arm.  Describes pain as intermittent pain all day, dull when sitting still, sharp with movement and inspiration.    20g lfa, 324 asa given and 1 spray of NTG given with small amount of relief.  102/59 Cbg:  163 68 NSR 95% RA

## 2023-03-28 NOTE — ED Triage Notes (Signed)
Pt brought in via ems from home with left side chest pain.  Pt reports pain in left shoulder and arm.  Pt reports some pain relief with ntg.  Pt has nausea.  Pain began at 0400 today.  Pt alert  speech clear.

## 2023-03-28 NOTE — ED Provider Notes (Signed)
[facility] Provider Note    Event Date/Time   First MD Initiated Contact with Patient 03/28/23 1739     (approximate)   History   Chief Complaint Chest Pain   HPI  Catherine Hughes is a 72 y.o. female with past medical history of arthritis, fibromyalgia, bipolar disorder, and chronic pain syndrome who presents to the ED complaining of chest pain.  Patient reports that she woke up around 4 AM with intermittent stabbing pain in the left side of her chest which has become constant within the past 2 hours.  She states the pain is worse when she takes a deep breath or changes position, denies any recent trauma to her chest.  She has not had any fevers or cough, does report feeling slightly short of breath.  She has not noticed any pain or swelling in her legs.  She denies any history of similar symptoms.     Physical Exam   Triage Vital Signs: ED Triage Vitals  Enc Vitals Group     BP 03/28/23 1702 109/62     Pulse Rate 03/28/23 1702 67     Resp 03/28/23 1702 20     Temp 03/28/23 1702 99.2 F (37.3 C)     Temp Source 03/28/23 1702 Oral     SpO2 03/28/23 1702 100 %     Weight 03/28/23 1658 135 lb (61.2 kg)     Height 03/28/23 1658 5\' 5"  (1.651 m)     Head Circumference --      Peak Flow --      Pain Score 03/28/23 1658 9     Pain Loc --      Pain Edu? --      Excl. in GC? --     Most recent vital signs: Vitals:   03/28/23 1702  BP: 109/62  Pulse: 67  Resp: 20  Temp: 99.2 F (37.3 C)  SpO2: 100%    Constitutional: Alert and oriented. Eyes: Conjunctivae are normal. Head: Atraumatic. Nose: No congestion/rhinnorhea. Mouth/Throat: Mucous membranes are moist.  Cardiovascular: Normal rate, regular rhythm. Grossly normal heart sounds.  2+ radial pulses bilaterally. Respiratory: Normal respiratory effort.  No retractions. Lungs CTAB. Gastrointestinal: Soft and nontender. No distention. Musculoskeletal: No lower extremity tenderness nor edema.   Neurologic:  Normal speech and language. No gross focal neurologic deficits are appreciated.    ED Results / Procedures / Treatments   Labs (all labs ordered are listed, but only abnormal results are displayed) Labs Reviewed  BASIC METABOLIC PANEL - Abnormal; Notable for the following components:      Result Value   Sodium 133 (*)    Glucose, Bld 118 (*)    All other components within normal limits  D-DIMER, QUANTITATIVE - Abnormal; Notable for the following components:   D-Dimer, Quant 1.35 (*)    All other components within normal limits  CBC  TROPONIN I (HIGH SENSITIVITY)  TROPONIN I (HIGH SENSITIVITY)     EKG  ED ECG REPORT I, Chesley Noon, the attending physician, personally viewed and interpreted this ECG.   Date: 03/28/2023  EKG Time: 17:06  Rate: 64  Rhythm: normal sinus rhythm  Axis: Normal  Intervals:none  ST&T Change: None  RADIOLOGY Chest x-ray reviewed and interpreted by me with no infiltrate, edema, or effusion.  PROCEDURES:  Critical Care performed: No  Procedures   MEDICATIONS ORDERED IN ED: Medications  lidocaine (LIDODERM) 5 % 1 patch (1 patch Transdermal Patch Applied 03/28/23 1802)  morphine (  PF) 4 MG/ML injection 4 mg (4 mg Intravenous Given 03/28/23 1759)  iohexol (OMNIPAQUE) 350 MG/ML injection 75 mL (75 mLs Intravenous Contrast Given 03/28/23 1932)  oxyCODONE (Oxy IR/ROXICODONE) immediate release tablet 5 mg (5 mg Oral Given 03/28/23 1947)     IMPRESSION / MDM / ASSESSMENT AND PLAN / ED COURSE  I reviewed the triage vital signs and the nursing notes.                              72 y.o. female with past medical history of fibromyalgia, bipolar disorder, arthritis, and chronic pain syndrome who presents to the ED with intermittent stabbing pain in her left chest worse with a deep breath that has become constant over the past 2 hours.  Patient's presentation is most consistent with acute presentation with potential threat to life or  bodily function.  Differential diagnosis includes, but is not limited to, ACS, PE, dissection, pneumonia, pneumothorax, GERD, musculoskeletal pain, and anxiety.  Patient nontoxic-appearing and in no acute distress, vital signs are unremarkable.  EKG shows no evidence of arrhythmia or ischemia, initial troponin within normal limits and symptoms seem atypical for ACS.  Given intermittent pain, we will check second set troponin, also further assess for PE with D-dimer.  Additional labs are reassuring with no significant anemia, leukocytosis, tract abnormality, or AKI.  We will treat symptomatically with IV morphine and Lidoderm patch.  2 sets of troponin are within normal limits and I doubt ACS.  D-dimer elevated but CTA negative for PE or other acute process.  Patient appropriate for outpatient management of suspected musculoskeletal chest pain.  She was counseled to follow-up with her PCP and to return to the ED for new or worsening symptoms, patient agrees with plan.      FINAL CLINICAL IMPRESSION(S) / ED DIAGNOSES   Final diagnoses:  Nonspecific chest pain     Rx / DC Orders   ED Discharge Orders          Ordered    lidocaine (LIDODERM) 5 %  Every 12 hours        03/28/23 2135             Note:  This document was prepared using Dragon voice recognition software and may include unintentional dictation errors.   Chesley Noon, MD 03/28/23 2136

## 2023-04-05 ENCOUNTER — Ambulatory Visit: Payer: Medicare Other | Admitting: Gastroenterology

## 2023-04-05 ENCOUNTER — Encounter: Payer: Self-pay | Admitting: Gastroenterology

## 2023-04-05 VITALS — BP 109/73 | HR 71 | Temp 97.9°F | Ht 65.0 in | Wt 137.6 lb

## 2023-04-05 DIAGNOSIS — R1319 Other dysphagia: Secondary | ICD-10-CM | POA: Diagnosis not present

## 2023-04-05 DIAGNOSIS — N320 Bladder-neck obstruction: Secondary | ICD-10-CM | POA: Insufficient documentation

## 2023-04-05 NOTE — Addendum Note (Signed)
Addended by: Adela Ports on: 04/05/2023 11:30 AM   Modules accepted: Orders

## 2023-04-05 NOTE — Patient Instructions (Signed)
Please do not eat or drink after 7:00 AM the day of your test. If you need to reschedule or cancel, please call [phone].

## 2023-04-05 NOTE — Progress Notes (Signed)
Wyline Mood MD, MRCP(U.K) [address]  [address]  [place], [place] [address]  Main: [phone]  Fax: [phone]   Gastroenterology Consultation  Referring Provider:     Katrinka Blazing, MD Primary Care Physician:  Katrinka Blazing, MD Primary Gastroenterologist:  Dr. Wyline Mood  Reason for Consultation dysphagia        HPI:   Catherine Hughes is a 72 y.o. y/o female referred for consultation & management  by Dr. Boneta Lucks, Janae Sauce, MD.   She has been asked to see me for difficulty with swallowing. She says for the past 6 months she has been having difficulty swallowing the food goes down gets stuck and then when she drinks it comes gurgling back up denies any coughing denies that the food goes the wrong way.   07/27/2022 I performed a colonoscopy 5 mm polyp was resected in the ascending colon with a cold snare stool in the entire examined colon repeat colonoscopy recommended in 6 months because the bowel prep was suboptimal.  Past Medical History:  Diagnosis Date   Anxiety    Arthritis    Bipolar 1 disorder (HCC)    Depression    Fibromyalgia    Glaucoma    Menopausal state    Obesity (BMI 30.0-34.9)    Pneumonia     Past Surgical History:  Procedure Laterality Date   APPENDECTOMY     Open appendectomy (as a child)   BREAST SURGERY Bilateral implants and removal   CHOLECYSTECTOMY N/A 10/21/2017   Procedure: LAPAROSCOPIC CHOLECYSTECTOMY POSSIBLE OPEN;  Surgeon: Ancil Linsey, MD;  Location: [facility] ORS;  Service: General;  Laterality: N/A;   COLONOSCOPY WITH PROPOFOL N/A 07/25/2017   Procedure: COLONOSCOPY WITH PROPOFOL;  Surgeon: Wyline Mood, MD;  Location: [facility] ENDOSCOPY;  Service: Gastroenterology;  Laterality: N/A;   COLONOSCOPY WITH PROPOFOL N/A 07/27/2022   Procedure: COLONOSCOPY WITH PROPOFOL;  Surgeon: Wyline Mood, MD;  Location: [facility] ENDOSCOPY;  Service: Gastroenterology;  Laterality: N/A;   COLONOSCOPY WITH PROPOFOL N/A 01/05/2023   Procedure:  COLONOSCOPY WITH PROPOFOL;  Surgeon: Wyline Mood, MD;  Location: [facility] ENDOSCOPY;  Service: Gastroenterology;  Laterality: N/A;   FRACTURE SURGERY Right 2014   ankle   SPINE SURGERY  L4-5   fusion and coil surgery (anterior approach)    Prior to Admission medications   Medication Sig Start Date End Date Taking? Authorizing Provider  acyclovir ointment (ZOVIRAX) 5 % APPLY TOPICALLY AS DIRECTED TO THE AFFECTED AREA EVERY 3 HOURS 09/19/18   Steele Sizer, MD  ALPRAZolam (XANAX) 1 MG tablet TAKE 1 TABLET(1 MG) BY MOUTH THREE TIMES DAILY AS NEEDED FOR ANXIETY 07/30/19   Steele Sizer, MD  Ascorbic Acid (VITAMIN C) 1000 MG tablet Take 1,000 mg by mouth daily.    [provider]  carisoprodol (SOMA) 350 MG tablet Take 350 mg by mouth 4 (four) times daily as needed for muscle spasms.    [provider]  Cholecalciferol (VITAMIN D3) 2000 units capsule Take 1 capsule (2,000 Units total) by mouth daily. 03/10/16   [provider]  diazepam (VALIUM) 10 MG tablet Take 10 mg by mouth every 6 (six) hours as needed for anxiety.    [provider]  Diphenhyd-Hydrocort-Nystatin (FIRST-DUKES MOUTHWASH) SUSP Use as directed 5 mLs in the mouth or throat 4 (four) times daily. 09/03/18   Steele Sizer, MD  lidocaine (LIDODERM) 5 % Place 1 patch onto the skin every 12 (twelve) hours. Remove & Discard patch  within 12 hours or as directed by MD 03/28/23 03/27/24  Chesley Noon, MD  MAGNESIUM PO Take by mouth.    [provider]  MILK THISTLE PO Take 1 Dose by mouth.    [provider]  Multiple Vitamin (MULTI-VITAMINS) TABS Take by mouth.    [provider]  nitrofurantoin, macrocrystal-monohydrate, (MACROBID) 100 MG capsule Take 1 capsule (100 mg total) by mouth 2 (two) times daily. 08/09/22   Wallis Bamberg, PA-C  oxyCODONE (OXY IR/ROXICODONE) 5 MG immediate release tablet Take 5 mg by mouth every 4 (four) hours as needed for severe pain.    [provider]  vitamin B-12 (CYANOCOBALAMIN) 100 MCG tablet Take 100 mcg by mouth daily.    [provider]  VITAMIN K PO Take by mouth daily.    [provider]    Family History  Problem Relation Age of Onset   Cancer Mother        colon   Diabetes Mother    Coronary artery disease Mother    Parkinson's disease Father    Anxiety disorder Father    Anxiety disorder Paternal Aunt    Kidney disease Neg Hx      Social History   Tobacco Use   Smoking status: Former    Types: Cigarettes    Quit date: 03/18/1989    Years since quitting: 34.0   Smokeless tobacco: Never  Vaping Use   Vaping Use: Never used  Substance Use Topics   Alcohol use: Not Currently    Alcohol/week: 0.0 standard drinks of alcohol    Comment: rarely   Drug use: No    Allergies as of 04/05/2023 - Review Complete 04/05/2023  Allergen Reaction Noted   Tape Rash 12/02/2015   Morphine  12/21/2016   Morphine and codeine  12/14/2015   Other Rash 12/21/2016   Terfenadine Palpitations 12/21/2016    Review of Systems:    All systems reviewed and negative except where noted in HPI.   Physical Exam:  BP 109/73   Pulse 71   Temp 97.9 F (36.6 C) (Oral)   Ht 5\' 5"  (1.651 m)   Wt 137 lb 9.6 oz (62.4 kg)   BMI 22.90 kg/m  No LMP recorded. Patient is postmenopausal. Psych:  Alert and cooperative. Normal mood and affect. General:   Alert,  Well-developed, well-nourished, pleasant and cooperative in NAD Head:  Normocephalic and atraumatic. Eyes:  Sclera clear, no icterus.   Conjunctiva pink. Neurologic:  Alert and oriented x3;  grossly normal neurologically. Psych:  Alert and cooperative. Normal mood and affect.  Imaging Studies: CT Angio Chest PE W/Cm &/Or Wo Cm  Result Date: 03/28/2023 CLINICAL DATA:  Pulmonary embolism (PE) suspected, low to intermediate prob, positive D-dimer Left-sided chest pain. Nausea. EXAM: CT ANGIOGRAPHY CHEST WITH CONTRAST TECHNIQUE: Multidetector CT imaging  of the chest was performed using the standard protocol during bolus administration of intravenous contrast. Multiplanar CT image reconstructions and MIPs were obtained to evaluate the vascular anatomy. RADIATION DOSE REDUCTION: This exam was performed according to the departmental dose-optimization program which includes automated exposure control, adjustment of the mA and/or kV according to patient size and/or use of iterative reconstruction technique. CONTRAST:  75mL OMNIPAQUE IOHEXOL 350 MG/ML SOLN COMPARISON:  Radiograph earlier today. Chest CTA 10/20/2017 FINDINGS: Cardiovascular: There are no filling defects within the pulmonary arteries to suggest pulmonary embolus. Heart is normal in size. No pericardial effusion. The thoracic aorta is normal in caliber. No contrast in the aorta to  assess for dissection. Minimal aortic atherosclerosis Mediastinum/Nodes: No suspicious mediastinal or hilar adenopathy. Calcified left hilar nodes consistent with prior granulomatous disease. No esophageal wall thickening. No thyroid nodule. Lungs/Pleura: Mild emphysema. No focal airspace disease. No pleural fluid. No features of pulmonary edema. Calcified granuloma in the left lower lobe, benign needing no further follow-up. No pulmonary mass or suspicious nodule. Upper Abdomen: No acute upper abdominal finding. Cholecystectomy. Punctate calcified granulom in the liver. Musculoskeletal: There are no acute or suspicious osseous abnormalities. No chest wall soft tissue abnormalities or explanation for pain. Review of the MIP images confirms the above findings. IMPRESSION: No pulmonary embolus or acute intrathoracic abnormality. Aortic Atherosclerosis (ICD10-I70.0) and Emphysema (ICD10-J43.9). Electronically Signed   By: Narda Rutherford M.D.   On: 03/28/2023 20:31   DG Chest 2 View  Result Date: 03/28/2023 CLINICAL DATA:  Dizziness with left-sided back, shoulder and rib pain. EXAM: CHEST - 2 VIEW COMPARISON:  October 22, 2017  FINDINGS: The heart size and mediastinal contours are within normal limits. There is no evidence of acute infiltrate, pleural effusion or pneumothorax. Radiopaque surgical clips are seen within the right upper quadrant. Multilevel degenerative changes seen throughout the thoracic spine. IMPRESSION: No active cardiopulmonary disease. Electronically Signed   By: Aram Candela M.D.   On: 03/28/2023 18:47    Assessment and Plan:   DORTHE NEILSEN is a 72 y.o. y/o female has been referred for GI for dysphagia.  Last colonoscopy in October 2023 was suboptimal due to poor prep.   Plan 1.  Barium swallow with tablet as initial step as I would like to rule out diverticulum based on the results we will discuss upper endoscopy evaluation  Follow up in 4 to 6 weeks with me after results  Dr Wyline Mood MD,MRCP(U.K)

## 2023-04-10 ENCOUNTER — Other Ambulatory Visit: Payer: Medicare Other

## 2023-04-14 ENCOUNTER — Ambulatory Visit
Admission: RE | Admit: 2023-04-14 | Discharge: 2023-04-14 | Disposition: A | Discharge status: Home / Self Care | Payer: Medicare Other | Source: Ambulatory Visit | Attending: Gastroenterology | Admitting: Gastroenterology

## 2023-04-14 DIAGNOSIS — R1319 Other dysphagia: Secondary | ICD-10-CM | POA: Diagnosis present

## 2023-05-17 ENCOUNTER — Telehealth: Payer: Self-pay

## 2023-05-17 ENCOUNTER — Other Ambulatory Visit: Payer: Self-pay

## 2023-05-17 DIAGNOSIS — R1319 Other dysphagia: Secondary | ICD-10-CM

## 2023-05-17 NOTE — Progress Notes (Signed)
Shows only dysmotility - if still has dysphagia then needs EGD

## 2023-05-17 NOTE — Telephone Encounter (Signed)
-----   Message from Wyline Mood sent at 05/17/2023  1:31 PM EDT ----- Shows only dysmotility - if still has dysphagia then needs EGD

## 2023-05-17 NOTE — Telephone Encounter (Signed)
Called patient to let her know that her results showed dysmotility. Patient also stated she continues to have dysphagia. Therefore, I told her that Dr. Tobi Bastos wanted her to have an EGD to check and see if she has anything else. Patient agreed to have the EGD on 07/03/2023. I let her know what she was needing to do and that I would be sending her instructions via MyChart and by mail. Patient understood and had no further questions.

## 2023-05-29 ENCOUNTER — Telehealth: Payer: Self-pay

## 2023-05-29 NOTE — Telephone Encounter (Signed)
Patient called and wanted to know why she was having an EGD. I told her that last time she came in to see Dr. Tobi Bastos, she was complaining of difficulty swallowing. Therefore, he ordered an EGD. Patient agreed and had no further questions.

## 2023-05-31 ENCOUNTER — Telehealth: Payer: Self-pay | Admitting: Gastroenterology

## 2023-05-31 NOTE — Telephone Encounter (Signed)
Patient called in to let us know that she did received appointment from [facility] regarding her appointment on the sep.16.24 and she don't need a call back.

## 2023-06-28 ENCOUNTER — Telehealth: Payer: Self-pay | Admitting: Gastroenterology

## 2023-06-28 NOTE — Telephone Encounter (Signed)
Called patient back and left her a detailed message letting her know that the reason that she was having an EGD was because she was complaining of dysphagia when she came to her appointment in June. I also let her know that we have spoken about this several times and she had agreed on moving forward with the EGD. However, I let her know that if she wants to cancel her procedure not surgery, then to call us back.

## 2023-06-28 NOTE — Telephone Encounter (Signed)
Patient called in wanting to know what was her procedure is for.

## 2023-06-30 ENCOUNTER — Encounter: Payer: Self-pay | Admitting: Gastroenterology

## 2023-07-03 ENCOUNTER — Ambulatory Visit
Admission: RE | Admit: 2023-07-03 | Discharge: 2023-07-03 | Disposition: A | Discharge status: Home / Self Care | Payer: Medicare Other | Attending: Gastroenterology | Admitting: Gastroenterology

## 2023-07-03 ENCOUNTER — Ambulatory Visit: Payer: Medicare Other | Admitting: Registered Nurse

## 2023-07-03 ENCOUNTER — Encounter
Admission: RE | Disposition: A | Discharge status: Home / Self Care | Payer: Self-pay | Source: Home / Self Care | Attending: Gastroenterology

## 2023-07-03 ENCOUNTER — Encounter: Payer: Self-pay | Admitting: Gastroenterology

## 2023-07-03 ENCOUNTER — Other Ambulatory Visit: Payer: Self-pay

## 2023-07-03 DIAGNOSIS — M797 Fibromyalgia: Secondary | ICD-10-CM | POA: Diagnosis not present

## 2023-07-03 DIAGNOSIS — F419 Anxiety disorder, unspecified: Secondary | ICD-10-CM | POA: Diagnosis not present

## 2023-07-03 DIAGNOSIS — R1319 Other dysphagia: Secondary | ICD-10-CM

## 2023-07-03 DIAGNOSIS — Z5986 Financial insecurity: Secondary | ICD-10-CM | POA: Insufficient documentation

## 2023-07-03 DIAGNOSIS — R1310 Dysphagia, unspecified: Secondary | ICD-10-CM | POA: Diagnosis present

## 2023-07-03 DIAGNOSIS — Z5982 Transportation insecurity: Secondary | ICD-10-CM | POA: Diagnosis not present

## 2023-07-03 DIAGNOSIS — K224 Dyskinesia of esophagus: Secondary | ICD-10-CM | POA: Insufficient documentation

## 2023-07-03 DIAGNOSIS — F319 Bipolar disorder, unspecified: Secondary | ICD-10-CM | POA: Insufficient documentation

## 2023-07-03 DIAGNOSIS — F1120 Opioid dependence, uncomplicated: Secondary | ICD-10-CM | POA: Insufficient documentation

## 2023-07-03 DIAGNOSIS — Z8673 Personal history of transient ischemic attack (TIA), and cerebral infarction without residual deficits: Secondary | ICD-10-CM | POA: Insufficient documentation

## 2023-07-03 DIAGNOSIS — K2289 Other specified disease of esophagus: Secondary | ICD-10-CM

## 2023-07-03 DIAGNOSIS — Z87891 Personal history of nicotine dependence: Secondary | ICD-10-CM | POA: Diagnosis not present

## 2023-07-03 HISTORY — PX: ESOPHAGOGASTRODUODENOSCOPY (EGD) WITH PROPOFOL: SHX5813

## 2023-07-03 HISTORY — DX: Cerebral infarction, unspecified: I63.9

## 2023-07-03 SURGERY — ESOPHAGOGASTRODUODENOSCOPY (EGD) WITH PROPOFOL
Anesthesia: General

## 2023-07-03 MED ORDER — LIDOCAINE HCL (CARDIAC) PF 100 MG/5ML IV SOSY
PREFILLED_SYRINGE | INTRAVENOUS | Status: DC | PRN
  Administered 2023-07-03: 60 mg via INTRAVENOUS

## 2023-07-03 MED ORDER — PROPOFOL 10 MG/ML IV BOLUS
INTRAVENOUS | Status: DC | PRN
  Administered 2023-07-03: 20 mg via INTRAVENOUS
  Administered 2023-07-03: 80 mg via INTRAVENOUS

## 2023-07-03 MED ORDER — GLYCOPYRROLATE 0.2 MG/ML IJ SOLN
INTRAMUSCULAR | Status: DC | PRN
  Administered 2023-07-03: .2 mg via INTRAVENOUS

## 2023-07-03 MED ORDER — SODIUM CHLORIDE 0.9 % IV SOLN: INTRAVENOUS | Status: DC

## 2023-07-03 NOTE — Anesthesia Preprocedure Evaluation (Signed)
Anesthesia Evaluation  Patient identified by MRN, date of birth, ID band Patient awake    Reviewed: Allergy & Precautions, NPO status , Patient's Chart, lab work & pertinent test results  History of Anesthesia Complications Negative for: history of anesthetic complications  Airway Mallampati: II  TM Distance: >3 FB Neck ROM: Full    Dental no notable dental hx. (+) Teeth Intact   Pulmonary neg pulmonary ROS, neg sleep apnea, neg COPD, Patient abstained from smoking.Not current smoker, former smoker   Pulmonary exam normal breath sounds clear to auscultation       Cardiovascular Exercise Tolerance: Good METS(-) hypertension(-) CAD and (-) Past MI negative cardio ROS (-) dysrhythmias  Rhythm:Regular Rate:Normal - Systolic murmurs    Neuro/Psych  Headaches PSYCHIATRIC DISORDERS Anxiety Depression Bipolar Disorder   Patient says her left side "tingles" more than her right since her stroke  Neuromuscular disease CVA, Residual Symptoms    GI/Hepatic ,neg GERD  ,,(+)     (-) substance abuse    Endo/Other  neg diabetes    Renal/GU negative Renal ROS     Musculoskeletal  (+)  Fibromyalgia -, narcotic dependent  Abdominal   Peds  Hematology   Anesthesia Other Findings Past Medical History: No date: Anxiety No date: Arthritis No date: Bipolar 1 disorder (HCC) No date: Depression No date: Fibromyalgia No date: Glaucoma No date: Menopausal state No date: Obesity (BMI 30.0-34.9) No date: Pneumonia No date: Stroke [facility])  Reproductive/Obstetrics                             Anesthesia Physical Anesthesia Plan  ASA: 2  Anesthesia Plan: General   Post-op Pain Management: Minimal or no pain anticipated   Induction: Intravenous  PONV Risk Score and Plan: 3 and Propofol infusion, TIVA and Ondansetron  Airway Management Planned: Nasal Cannula  Additional Equipment: None  Intra-op Plan:    Post-operative Plan:   Informed Consent: I have reviewed the patients History and Physical, chart, labs and discussed the procedure including the risks, benefits and alternatives for the proposed anesthesia with the patient or authorized representative who has indicated his/her understanding and acceptance.     Dental advisory given  Plan Discussed with: CRNA and Surgeon  Anesthesia Plan Comments: (Discussed risks of anesthesia with patient, including possibility of difficulty with spontaneous ventilation under anesthesia necessitating airway intervention, PONV, and rare risks such as cardiac or respiratory or neurological events, and allergic reactions. Discussed the role of CRNA in patient's perioperative care. Patient understands.)       Anesthesia Quick Evaluation

## 2023-07-03 NOTE — H&P (Signed)
Wyline Mood, MD [address], [address], [place], [place], [address] [address], [address], [place], [place], [address] Phone: [phone]  Fax: [phone]  Primary Care Physician:  Katrinka Blazing, MD   Pre-Procedure History & Physical: HPI:  Catherine Hughes is a 72 y.o. female is here for an endoscopy    Past Medical History:  Diagnosis Date   Anxiety    Arthritis    Bipolar 1 disorder (HCC)    Depression    Fibromyalgia    Glaucoma    Menopausal state    Obesity (BMI 30.0-34.9)    Pneumonia    Stroke [facility])     Past Surgical History:  Procedure Laterality Date   APPENDECTOMY     Open appendectomy (as a child)   BREAST SURGERY Bilateral implants and removal   CHOLECYSTECTOMY N/A 10/21/2017   Procedure: LAPAROSCOPIC CHOLECYSTECTOMY POSSIBLE OPEN;  Surgeon: Ancil Linsey, MD;  Location: [facility] ORS;  Service: General;  Laterality: N/A;   COLONOSCOPY WITH PROPOFOL N/A 07/25/2017   Procedure: COLONOSCOPY WITH PROPOFOL;  Surgeon: Wyline Mood, MD;  Location: [facility] ENDOSCOPY;  Service: Gastroenterology;  Laterality: N/A;   COLONOSCOPY WITH PROPOFOL N/A 07/27/2022   Procedure: COLONOSCOPY WITH PROPOFOL;  Surgeon: Wyline Mood, MD;  Location: [facility] ENDOSCOPY;  Service: Gastroenterology;  Laterality: N/A;   COLONOSCOPY WITH PROPOFOL N/A 01/05/2023   Procedure: COLONOSCOPY WITH PROPOFOL;  Surgeon: Wyline Mood, MD;  Location: [facility] ENDOSCOPY;  Service: Gastroenterology;  Laterality: N/A;   FRACTURE SURGERY Right 2014   ankle   SPINE SURGERY  L4-5   fusion and coil surgery (anterior approach)    Prior to Admission medications   Medication Sig Start Date End Date Taking? Authorizing Provider  diazepam (VALIUM) 10 MG tablet Take 10 mg by mouth every 6 (six) hours as needed for anxiety.   Yes [provider]  oxyCODONE (OXY IR/ROXICODONE) 5 MG immediate release tablet Take 5 mg by mouth every 4 (four) hours as needed for severe pain.   Yes [provider]   acyclovir ointment (ZOVIRAX) 5 % APPLY TOPICALLY AS DIRECTED TO THE AFFECTED AREA EVERY 3 HOURS 09/19/18   Crissman, Redge Gainer, MD  ALPRAZolam (XANAX) 1 MG tablet TAKE 1 TABLET(1 MG) BY MOUTH THREE TIMES DAILY AS NEEDED FOR ANXIETY Patient not taking: Reported on 06/26/2023 07/30/19   Steele Sizer, MD  Ascorbic Acid (VITAMIN C) 1000 MG tablet Take 1,000 mg by mouth daily.    [provider]  carisoprodol (SOMA) 350 MG tablet Take 350 mg by mouth 4 (four) times daily as needed for muscle spasms. Patient not taking: Reported on 06/26/2023    [provider]  Cholecalciferol (VITAMIN D3) 2000 units capsule Take 1 capsule (2,000 Units total) by mouth daily. 03/10/16   [provider]  Diphenhyd-Hydrocort-Nystatin (FIRST-DUKES MOUTHWASH) SUSP Use as directed 5 mLs in the mouth or throat 4 (four) times daily. 09/03/18   Steele Sizer, MD  lidocaine (LIDODERM) 5 % Place 1 patch onto the skin every 12 (twelve) hours. Remove & Discard patch within 12 hours or as directed by MD 03/28/23 03/27/24  Chesley Noon, MD  MAGNESIUM PO Take by mouth.    [provider]  MILK THISTLE PO Take 1 Dose by mouth.    [provider]  Multiple Vitamin (MULTI-VITAMINS) TABS Take by mouth.    [provider]  nitrofurantoin, macrocrystal-monohydrate, (MACROBID) 100 MG capsule Take 1 capsule (100 mg total) by mouth 2 (two) times  daily. 08/09/22   Wallis Bamberg, PA-C  vitamin B-12 (CYANOCOBALAMIN) 100 MCG tablet Take 100 mcg by mouth daily.    [provider]  VITAMIN K PO Take by mouth daily.    [provider]    Allergies as of 05/17/2023 - Review Complete 04/05/2023  Allergen Reaction Noted   Tape Rash 12/02/2015   Morphine  12/21/2016   Morphine and codeine  12/14/2015   Other Rash 12/21/2016   Terfenadine Palpitations 12/21/2016    Family History  Problem Relation Age of Onset   Cancer Mother        colon   Diabetes Mother    Coronary  artery disease Mother    Parkinson's disease Father    Anxiety disorder Father    Anxiety disorder Paternal Aunt    Kidney disease Neg Hx     Social History   Socioeconomic History   Marital status: Married    Spouse name: paul   Number of children: 2   Years of education: cosmetology    Highest education level: Some college, no degree  Occupational History   Not on file  Tobacco Use   Smoking status: Former    Current packs/day: 0.00    Types: Cigarettes    Quit date: 03/18/1989    Years since quitting: 34.3   Smokeless tobacco: Never  Vaping Use   Vaping status: Never Used  Substance and Sexual Activity   Alcohol use: Yes    Comment: rarely   Drug use: No   Sexual activity: Not Currently  Other Topics Concern   Not on file  Social History Narrative   Not on file   Social Determinants of Health   Financial Resource Strain: Medium Risk (06/30/2023)   Received from [facility]   Overall Financial Resource Strain (CARDIA)    Difficulty of Paying Living Expenses: Somewhat hard  Food Insecurity: Food Insecurity Present (06/30/2023)   Received from [facility]   Hunger Vital Sign    Worried About Running Out of Food in the Last Year: Sometimes true    Ran Out of Food in the Last Year: Never true  Transportation Needs: Unmet Transportation Needs (06/30/2023)   Received from [facility]   PRAPARE - Transportation    Lack of Transportation (Medical): Yes    Lack of Transportation (Non-Medical): Yes  Physical Activity: Inactive (11/29/2022)   Received from [facility], [facility]   Exercise Vital Sign    Days of Exercise per Week: 0 days    Minutes of Exercise per Session: 0 min  Stress: Stress Concern Present (11/29/2022)   Received from [facility], [facility]   [employer] of Occupational Health - Occupational Stress Questionnaire    Feeling of Stress : Very much  Social  Connections: Moderately Isolated (11/29/2022)   Received from [facility], [facility]   Social Connection and Isolation Panel [NHANES]    Frequency of Communication with Friends and Family: More than three times a week    Frequency of Social Gatherings with Friends and Family: Never    Attends Religious Services: Never    Database administrator or Organizations: No    Attends Banker Meetings: Never    Marital Status: Married  Catering manager Violence: Not At Risk (06/11/2018)   Humiliation, Afraid, Rape, and Kick questionnaire    Fear of Current or Ex-Partner: No    Emotionally  Abused: No    Physically Abused: No    Sexually Abused: No    Review of Systems: See HPI, otherwise negative ROS  Physical Exam: BP (!) 148/75   Pulse 65   Temp (!) 97 F (36.1 C) (Temporal)   Resp 20   Ht 5\' 5"  (1.651 m)   Wt 63.8 kg   SpO2 100%   BMI 23.40 kg/m  General:   Alert,  pleasant and cooperative in NAD Head:  Normocephalic and atraumatic. Neck:  Supple; no masses or thyromegaly. Lungs:  Clear throughout to auscultation, normal respiratory effort.    Heart:  +S1, +S2, Regular rate and rhythm, No edema. Abdomen:  Soft, nontender and nondistended. Normal bowel sounds, without guarding, and without rebound.   Neurologic:  Alert and  oriented x4;  grossly normal neurologically.  Impression/Plan: SWATHI SOCKS is here for an endoscopy  to be performed for  evaluation of dysphagia    Risks, benefits, limitations, and alternatives regarding endoscopy have been reviewed with the patient.  Questions have been answered.  All parties agreeable.   Wyline Mood, MD  07/03/2023, 10:33 AM

## 2023-07-03 NOTE — Anesthesia Postprocedure Evaluation (Signed)
Anesthesia Post Note  Patient: ANASTON GRIMLEY  Procedure(s) Performed: ESOPHAGOGASTRODUODENOSCOPY (EGD) WITH PROPOFOL  Patient location during evaluation: Endoscopy Anesthesia Type: General Level of consciousness: awake and alert Pain management: pain level controlled Vital Signs Assessment: post-procedure vital signs reviewed and stable Respiratory status: spontaneous breathing, nonlabored ventilation, respiratory function stable and patient connected to nasal cannula oxygen Cardiovascular status: blood pressure returned to baseline and stable Postop Assessment: no apparent nausea or vomiting Anesthetic complications: no   No notable events documented.   Last Vitals:  Vitals:   07/03/23 1115 07/03/23 1125  BP: 120/69 134/74  Pulse: 69 72  Resp: 16 19  Temp: (!) 36 C   SpO2: 97% 99%    Last Pain:  Vitals:   07/03/23 1115  TempSrc: Temporal  PainSc: Asleep                 Corinda Gubler

## 2023-07-03 NOTE — Transfer of Care (Signed)
Immediate Anesthesia Transfer of Care Note  Patient: Catherine Hughes  Procedure(s) Performed: ESOPHAGOGASTRODUODENOSCOPY (EGD) WITH PROPOFOL  Patient Location: PACU  Anesthesia Type:General  Level of Consciousness: drowsy  Airway & Oxygen Therapy: Patient Spontanous Breathing  Post-op Assessment: Report given to RN and Post -op Vital signs reviewed and stable  Post vital signs: stable  Last Vitals:  Vitals Value Taken Time  BP    Temp    Pulse    Resp    SpO2      Last Pain:  Vitals:   07/03/23 1016  TempSrc: Temporal  PainSc: 0-No pain         Complications: No notable events documented.

## 2023-07-03 NOTE — Op Note (Signed)
[facility] Patient Name: Catherine Hughes Procedure Date: 07/03/2023 11:02 AM MRN: [id number] Account #: [id number] Date of Birth: 08-23-1951 Admit Type: Outpatient Age: 72 Room: [facility] ENDO ROOM 1 Gender: Female Note Status: Finalized Instrument Name: Upper Endoscope [id number] Procedure:             Upper GI endoscopy Indications:           Dysphagia Providers:             Wyline Mood MD, MD Referring MD:          No Local Md, MD (Referring MD) Medicines:             Monitored Anesthesia Care Complications:         No immediate complications. Procedure:             Pre-Anesthesia Assessment:                        - Prior to the procedure, a History and Physical was                         performed, and patient medications, allergies and                         sensitivities were reviewed. The patient's tolerance                         of previous anesthesia was reviewed.                        - The risks and benefits of the procedure and the                         sedation options and risks were discussed with the                         patient. All questions were answered and informed                         consent was obtained.                        - ASA Grade Assessment: II - A patient with mild                         systemic disease.                        After obtaining informed consent, the endoscope was                         passed under direct vision. Throughout the procedure,                         the patient's blood pressure, pulse, and oxygen                         saturations were monitored continuously. The Endoscope                         was introduced  through the mouth, and advanced to the                         third part of duodenum. The upper GI endoscopy was                         accomplished with ease. The patient tolerated the                         procedure well. Findings:      The stomach was normal.       The examined duodenum was normal.      Abnormal motility was noted in the esophagus. The cricopharyngeus was       abnormal. There is a decrease in motility of the esophageal body. The       distal esophagus/lower esophageal sphincter is open. Biopsies were taken       with a cold forceps for histology. Impression:            - Normal stomach.                        - Normal examined duodenum.                        - Abnormal esophageal motility. Biopsied. Recommendation:        - Await pathology results.                        - Discharge patient to home (with escort).                        - Resume previous diet.                        - Continue present medications.                        - Await pathology results.                        - Return to my office as previously scheduled. Procedure Code(s):     --- Professional ---                        [phone], Esophagogastroduodenoscopy, flexible,                         transoral; with biopsy, single or multiple Diagnosis Code(s):     --- Professional ---                        K22.4, Dyskinesia of esophagus                        R13.10, Dysphagia, unspecified CPT copyright 2022 American Medical Association. All rights reserved. The codes documented in this report are preliminary and upon coder review may  be revised to meet current compliance requirements. Wyline Mood, MD Wyline Mood MD, MD 07/03/2023 11:16:19 AM This report has been signed electronically. Number of Addenda: 0 Note Initiated On: 07/03/2023 11:02 AM Estimated Blood Loss:  Estimated blood loss: none.      [facility]

## 2023-07-04 ENCOUNTER — Encounter: Payer: Self-pay | Admitting: Gastroenterology

## 2023-07-04 LAB — SURGICAL PATHOLOGY

## 2023-08-17 ENCOUNTER — Ambulatory Visit: Payer: Medicare Other | Admitting: Nurse Practitioner

## 2024-10-28 ENCOUNTER — Encounter: Payer: Self-pay | Admitting: *Deleted
